# Patient Record
Sex: Male | Born: 1965
Health system: Southern US, Community
[De-identification: ages and names within clinical notes are randomized; demographics above are authoritative.]

## PROBLEM LIST (undated history)

## (undated) DIAGNOSIS — K649 Unspecified hemorrhoids: Secondary | ICD-10-CM

## (undated) DIAGNOSIS — J449 Chronic obstructive pulmonary disease, unspecified: Secondary | ICD-10-CM

## (undated) DIAGNOSIS — R001 Bradycardia, unspecified: Secondary | ICD-10-CM

## (undated) DIAGNOSIS — K219 Gastro-esophageal reflux disease without esophagitis: Secondary | ICD-10-CM

## (undated) DIAGNOSIS — R399 Unspecified symptoms and signs involving the genitourinary system: Secondary | ICD-10-CM

## (undated) DIAGNOSIS — R3129 Other microscopic hematuria: Secondary | ICD-10-CM

## (undated) DIAGNOSIS — Z8709 Personal history of other diseases of the respiratory system: Secondary | ICD-10-CM

## (undated) DIAGNOSIS — J309 Allergic rhinitis, unspecified: Secondary | ICD-10-CM

## (undated) DIAGNOSIS — K625 Hemorrhage of anus and rectum: Secondary | ICD-10-CM

## (undated) DIAGNOSIS — R6882 Decreased libido: Secondary | ICD-10-CM

## (undated) DIAGNOSIS — J339 Nasal polyp, unspecified: Secondary | ICD-10-CM

## (undated) DIAGNOSIS — Z860100 Personal history of colon polyps, unspecified: Secondary | ICD-10-CM

## (undated) DIAGNOSIS — I499 Cardiac arrhythmia, unspecified: Secondary | ICD-10-CM

## (undated) DIAGNOSIS — N401 Enlarged prostate with lower urinary tract symptoms: Secondary | ICD-10-CM

## (undated) DIAGNOSIS — E559 Vitamin D deficiency, unspecified: Secondary | ICD-10-CM

## (undated) DIAGNOSIS — T7840XA Allergy, unspecified, initial encounter: Secondary | ICD-10-CM

## (undated) DIAGNOSIS — J45909 Unspecified asthma, uncomplicated: Secondary | ICD-10-CM

## (undated) HISTORY — DX: Bradycardia, unspecified: R00.1

## (undated) HISTORY — DX: Personal history of other diseases of the respiratory system: Z87.09

## (undated) HISTORY — DX: Decreased libido: R68.82

## (undated) HISTORY — DX: Allergy, unspecified, initial encounter: T78.40XA

## (undated) HISTORY — DX: Allergic rhinitis, unspecified: J30.9

## (undated) HISTORY — PX: NO PAST SURGERIES: SHX2092

## (undated) HISTORY — DX: Unspecified asthma, uncomplicated: J45.909

---
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
## 2005-11-13 ENCOUNTER — Ambulatory Visit: Payer: Self-pay | Admitting: Family Medicine

## 2006-01-07 ENCOUNTER — Ambulatory Visit: Payer: Self-pay | Admitting: Urology

## 2006-02-21 ENCOUNTER — Ambulatory Visit: Payer: Self-pay | Admitting: General Surgery

## 2008-05-30 ENCOUNTER — Ambulatory Visit: Payer: Self-pay | Admitting: Family Medicine

## 2009-03-13 ENCOUNTER — Emergency Department: Payer: Self-pay

## 2009-03-17 ENCOUNTER — Emergency Department: Payer: Self-pay | Admitting: Emergency Medicine

## 2009-03-31 ENCOUNTER — Emergency Department: Payer: Self-pay | Admitting: Emergency Medicine

## 2009-04-07 ENCOUNTER — Ambulatory Visit: Payer: Self-pay | Admitting: General Practice

## 2013-10-14 DIAGNOSIS — Z8614 Personal history of Methicillin resistant Staphylococcus aureus infection: Secondary | ICD-10-CM

## 2013-10-14 HISTORY — DX: Personal history of Methicillin resistant Staphylococcus aureus infection: Z86.14

## 2013-10-18 ENCOUNTER — Ambulatory Visit: Payer: Self-pay | Admitting: Gastroenterology

## 2014-07-14 LAB — CBC AND DIFFERENTIAL
NEUTROS ABS: 3 /uL
WBC: 4.3 10^3/mL

## 2015-02-21 ENCOUNTER — Ambulatory Visit
Admission: RE | Admit: 2015-02-21 | Discharge: 2015-02-21 | Disposition: A | Discharge status: Home / Self Care | Payer: 59 | Source: Ambulatory Visit | Attending: Family Medicine | Admitting: Family Medicine

## 2015-02-21 ENCOUNTER — Other Ambulatory Visit: Payer: Self-pay | Admitting: Family Medicine

## 2015-02-21 DIAGNOSIS — R05 Cough: Secondary | ICD-10-CM | POA: Insufficient documentation

## 2015-02-21 DIAGNOSIS — R059 Cough, unspecified: Secondary | ICD-10-CM

## 2015-03-09 ENCOUNTER — Other Ambulatory Visit (INDEPENDENT_AMBULATORY_CARE_PROVIDER_SITE_OTHER): Payer: 59

## 2015-03-09 ENCOUNTER — Encounter: Payer: Self-pay | Admitting: Pulmonary Disease

## 2015-03-09 ENCOUNTER — Ambulatory Visit (INDEPENDENT_AMBULATORY_CARE_PROVIDER_SITE_OTHER): Payer: 59 | Admitting: Pulmonary Disease

## 2015-03-09 VITALS — BP 118/70 | HR 60 | Temp 97.3°F | Ht 73.0 in | Wt 174.0 lb

## 2015-03-09 DIAGNOSIS — J3089 Other allergic rhinitis: Secondary | ICD-10-CM

## 2015-03-09 DIAGNOSIS — J4520 Mild intermittent asthma, uncomplicated: Secondary | ICD-10-CM | POA: Diagnosis not present

## 2015-03-09 DIAGNOSIS — R0600 Dyspnea, unspecified: Secondary | ICD-10-CM

## 2015-03-09 DIAGNOSIS — J309 Allergic rhinitis, unspecified: Secondary | ICD-10-CM | POA: Insufficient documentation

## 2015-03-09 DIAGNOSIS — J45909 Unspecified asthma, uncomplicated: Secondary | ICD-10-CM | POA: Insufficient documentation

## 2015-03-09 LAB — CBC WITH DIFFERENTIAL/PLATELET
Basophils Absolute: 0 10*3/uL (ref 0.0–0.1)
Basophils Relative: 1.3 % (ref 0.0–3.0)
EOS ABS: 0.1 10*3/uL (ref 0.0–0.7)
EOS PCT: 2.2 % (ref 0.0–5.0)
HCT: 40 % (ref 39.0–52.0)
Hemoglobin: 13.5 g/dL (ref 13.0–17.0)
Lymphocytes Relative: 24.7 % (ref 12.0–46.0)
Lymphs Abs: 0.9 10*3/uL (ref 0.7–4.0)
MCHC: 33.8 g/dL (ref 30.0–36.0)
MCV: 88.5 fl (ref 78.0–100.0)
MONO ABS: 0.4 10*3/uL (ref 0.1–1.0)
MONOS PCT: 9.9 % (ref 3.0–12.0)
Neutro Abs: 2.2 10*3/uL (ref 1.4–7.7)
Neutrophils Relative %: 61.9 % (ref 43.0–77.0)
PLATELETS: 280 10*3/uL (ref 150.0–400.0)
RBC: 4.52 Mil/uL (ref 4.22–5.81)
RDW: 15.8 % — AB (ref 11.5–15.5)
WBC: 3.6 10*3/uL — ABNORMAL LOW (ref 4.0–10.5)

## 2015-03-09 NOTE — Progress Notes (Signed)
Subjective:     Patient ID: Douglas Glaze., male   DOB: [birth year], 49 y.o.   MRN: [id number]  HPI      49 y/o BM referred by DrLMorrisey in [facility] for a pulmonary evaluation> Douglas Abbott has a hx of asthma x 32yrs he says, thinks maybe he had asthma as a child but doesn't recall details and thinks he grew out of it until he started a new job w/ [employer] about 6 yrs ago (he recalls dusty/ smokey/ & mask not required);  Symptoms include cough/ sputum esp w/ upper resp infections or "colds" (recent discolored phlegm), but he denies f/c/s, no CP, and denies SOB/ chest tightness/ wheezing...  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  He denies freq bouts of bronchitis, no hx pneumonia, Tb or exposure, etc... Current Meds> Dulera200-2spBid, AlbutHFA rescue inhaler, Singulair10, Flonase, & Prilosec20Bid...  EXAM reveals Afeb, VSS, O2sat=98% on RA;  HEENT- sl red, no exud, mallampati2;  Chest- clear x scat rhonchi, no w/r/consolidation;  Cor- RR w/o m/r/g;  Abd- soft, neg;  Ext- neg w/o c/c/e...   CXR 02/21/15 in Epic & done at [facility] showed norm heart size, clear lungs, mild degen changes in spine, NAD...  Spirometry 03/09/15 here showed FVC=4.09 (88%), FEV1=2.56 (69%), %1sec=63, mid-flows are reduced at 33% predicted; this is c/w mod airflow obstruction & poss GOLD Stage2 COPD.  LABS> CBC- ok w/ Hg=13.5, WBC=3.6 w/ 2%eos (absolute=100);  IgE=1171 w/ pos RAST tests tp Grasses> Trees> Fungus> Ragweed> Dust> animal dander  IMP/PLAN>>  Douglas Abbott has a hx asthma w/ an extrinsic component & I am concerned for an element of fixed obstruction based on airway remodeling over the yrs; he has only a min smoking hx in the past to  account for any COPD; he is on a good regimen w/ DULERA, SINGULAIR, and a rescue inhaler;  The remarkable IgE level and RAST tests point to another option for treatment w/ Arvid Right & we will pursue the approval process for this med w/ his insurance company...     Past Medical History  Diagnosis Date  . Asthma   . Allergic rhinitis     No past surgical history on file.   Outpatient Encounter Prescriptions as of 03/09/2015  Medication Sig  . albuterol (PROVENTIL HFA;VENTOLIN HFA) 108 (90 BASE) MCG/ACT inhaler Inhale 2 puffs into the lungs every 6 (six) hours as needed for wheezing or shortness of breath.  . DULERA 200-5 MCG/ACT AERO 2 puffs 2 (two) times daily.  . fluticasone (FLONASE) 50 MCG/ACT nasal spray Place into both nostrils daily as needed for allergies or rhinitis.  Marland Kitchen montelukast (SINGULAIR) 10 MG tablet Take 10 mg by mouth at bedtime.  Marland Kitchen omeprazole (PRILOSEC) 20 MG capsule Take 20 mg by mouth 2 (two) times daily before a meal.  . [DISCONTINUED] VENTOLIN HFA 108 (90 BASE) MCG/ACT inhaler 2 puffs every 4 (four) hours as needed.   No facility-administered encounter medications on file as of 03/09/2015.    No Known Allergies   Immunization History  Administered Date(s) Administered  . Influenza-Unspecified 08/08/2014    Family History  Problem Relation Age of Onset  . Asthma Mother   .  Lung cancer Mother     History   Social History  . Marital Status: Married    Spouse Name: N/A  . Number of Children: N/A  . Years of Education: N/A   Occupational History  . Not on file.   Social History Main Topics  . Smoking status: Former Smoker -- 1.00 packs/day for 8 years    Types: Cigarettes  . Smokeless tobacco: Not on file  . Alcohol Use: No  . Drug Use: Not on file  . Sexual Activity: Not on file   Other Topics Concern  . Not on file   Social History Narrative  . No narrative on file    Current Medications, Allergies, Past Medical History, Past Surgical  History, Family History, and Social History were reviewed in [employer] record.   Review of Systems         All symptoms NEG except where BOLDED >>  Constitutional:  F/C/S, fatigue, anorexia, unexpected weight change. HEENT:  HA, visual changes, hearing loss, earache, nasal symptoms, sore throat, mouth sores, hoarseness. Resp:  cough, sputum, hemoptysis; SOB, tightness, wheezing. Cardio:  CP, palpit, DOE, orthopnea, edema. GI:  N/V/D/C, blood in stool; reflux, abd pain, distention, gas. GU:  dysuria, freq, urgency, hematuria, flank pain, voiding difficulty. MS:  joint pain, swelling, tenderness, decr ROM; neck pain, back pain, etc. Neuro:  HA, tremors, seizures, dizziness, syncope, weakness, numbness, gait abn. Skin:  suspicious lesions or skin rash. Heme:  adenopathy, bruising, bleeding. Psyche:  confusion, agitation, sleep disturbance, hallucinations, anxiety, depression suicidal.   Objective:   Physical Exam      Vital Signs:  Reviewed...  General:  WD, WN, 49 y/o BM in NAD; alert & oriented; pleasant & cooperative... HEENT:  [place]/AT; Conjunctiva- pink, Sclera- nonicteric, EOM-wnl, PERRLA, EACs-clear, TMs-wnl; NOSE-clear; THROAT-clear & wnl. Neck:  Supple w/ full ROM; no JVD; normal carotid impulses w/o bruits; no thyromegaly or nodules palpated; no lymphadenopathy. Chest:  Clear to P & A; without wheezes, rales, or rhonchi heard at this time Heart:  Regular Rhythm; norm S1 & S2 without murmurs, rubs, or gallops detected. Abdomen:  Soft & nontender- no guarding or rebound; normal bowel sounds; no organomegaly or masses palpated. Ext:  Normal ROM; without deformities or arthritic changes; no varicose veins, venous insuffic, or edema;  Pulses intact w/o bruits. Neuro:  CNs II-XII intact; motor testing normal; sensory testing normal; gait normal & balance OK. Derm:  No lesions noted; no rash etc. Lymph:  No cervical, supraclavicular, axillary, or inguinal  adenopathy palpated.   Assessment:     IMP >>  ASTHMA w/ an element of fixed obstructive disease Allergic Rhinitis Elevated IgE level and remarkable RAST panel  PLAN >>  Douglas Abbott has a hx asthma w/ an extrinsic component & I am concerned for an element of fixed obstruction based on airway remodeling over the yrs; he has only a min smoking hx in the past to account for any COPD; he is on a good regimen w/ DULERA, SINGULAIR, and a rescue inhaler;  The remarkable IgE level and RAST tests point to another option for treatment w/ Arvid Right & we will pursue the approval process for this med w/ his insurance company      Plan:     Patient's Medications  New Prescriptions   No medications on file  Previous Medications   ALBUTEROL (PROVENTIL HFA;VENTOLIN HFA) 108 (90 BASE) MCG/ACT INHALER    Inhale 2 puffs into the lungs every 6 (six) hours as  needed for wheezing or shortness of breath.   DULERA 200-5 MCG/ACT AERO    2 puffs 2 (two) times daily.   FLUTICASONE (FLONASE) 50 MCG/ACT NASAL SPRAY    Place into both nostrils daily as needed for allergies or rhinitis.   MONTELUKAST (SINGULAIR) 10 MG TABLET    Take 10 mg by mouth at bedtime.   OMEPRAZOLE (PRILOSEC) 20 MG CAPSULE    Take 20 mg by mouth 2 (two) times daily before a meal.  Modified Medications   No medications on file  Discontinued Medications   VENTOLIN HFA 108 (90 BASE) MCG/ACT INHALER    2 puffs every 4 (four) hours as needed.

## 2015-03-09 NOTE — Patient Instructions (Signed)
Douglas Abbott- it was nice meeting you today...  We reviewed your recent CXR (the radiologist agrees w/ me that your XRay is clear), & we did a baseline pulmonary function test...  We decided to check some lab work to see if there are any other options for treating your asthma...    We will contact you w/ the results when available...   For now I would recommend continuing the DULERA200- 2sprays twice daily,  and use the ALBUTEROL-HFA rescue inhaler just as needed for wheezing/ chest tightness, etc...   It is OK to use an OTC antihistamine like Claritin or Zyrtek as needed for allergy symptoms, along w/ the Singulair & Flonase as you are doing...  Call for any questions, ant breathing problems, or if we can be of service in any way.Marland KitchenMarland Kitchen

## 2015-03-10 LAB — ALLERGY FULL PROFILE
ALTERNARIA ALTERNATA: 0.79 kU/L — AB
ASPERGILLUS FUMIGATUS M3: 3.54 kU/L — AB
Allergen, D pternoyssinus,d7: 4.2 kU/L — ABNORMAL HIGH
BAHIA GRASS: 19.2 kU/L — AB
BOX ELDER: 3.86 kU/L — AB
Bermuda Grass: 16.1 kU/L — ABNORMAL HIGH
CANDIDA ALBICANS: 3.56 kU/L — AB
COMMON RAGWEED: 6.44 kU/L — AB
CURVULARIA LUNATA: 0.49 kU/L — AB
Cat Dander: 0.23 kU/L — ABNORMAL HIGH
D. FARINAE: 13.6 kU/L — AB
DOG DANDER: 0.44 kU/L — AB
ELM IGE: 1.61 kU/L — AB
FESCUE: 37.2 kU/L — AB
G005 Rye, Perennial: 28.8 kU/L — ABNORMAL HIGH
G009 Red Top: 29.1 kU/L — ABNORMAL HIGH
GOOSE FEATHERS: 0.21 kU/L — AB
Goldenrod: 1.99 kU/L — ABNORMAL HIGH
HELMINTHOSPORIUM HALODES: 1.93 kU/L — AB
House Dust Hollister: 0.87 kU/L — ABNORMAL HIGH
IGE (IMMUNOGLOBULIN E), SERUM: 1171 kU/L — AB (ref <115)
LAMB'S QUARTERS CLASS: 1.64 kU/L — AB
OAK CLASS: 20.2 kU/L — AB
Plantain: 2.82 kU/L — ABNORMAL HIGH
SYCAMORE TREE: 2.27 kU/L — AB
Stemphylium Botryosum: 0.89 kU/L — ABNORMAL HIGH
Timothy Grass: 20.4 kU/L — ABNORMAL HIGH

## 2015-03-14 ENCOUNTER — Telehealth: Payer: Self-pay | Admitting: Pulmonary Disease

## 2015-03-14 NOTE — Telephone Encounter (Signed)
lmtcb X1 for pt  

## 2015-03-14 NOTE — Telephone Encounter (Signed)
Returning Dr Jeannine Kitten call to discuss lab results.  Per Apolonio Schneiders, Dr Lenna Gilford is discussing starting the patient on Xolair.  Will send to Apolonio Schneiders to document anything else needed.  Otherwise, message can be closed.

## 2015-04-05 ENCOUNTER — Other Ambulatory Visit: Payer: Self-pay | Admitting: Family Medicine

## 2015-04-05 ENCOUNTER — Encounter (INDEPENDENT_AMBULATORY_CARE_PROVIDER_SITE_OTHER): Payer: Self-pay

## 2015-04-05 ENCOUNTER — Encounter: Payer: Self-pay | Admitting: Family Medicine

## 2015-04-05 ENCOUNTER — Ambulatory Visit (INDEPENDENT_AMBULATORY_CARE_PROVIDER_SITE_OTHER): Payer: 59 | Admitting: Family Medicine

## 2015-04-05 DIAGNOSIS — Z0000 Encounter for general adult medical examination without abnormal findings: Secondary | ICD-10-CM | POA: Insufficient documentation

## 2015-04-05 DIAGNOSIS — N528 Other male erectile dysfunction: Secondary | ICD-10-CM | POA: Diagnosis not present

## 2015-04-05 MED ORDER — SILDENAFIL CITRATE 100 MG PO TABS: 50.0000 mg | ORAL_TABLET | Freq: Every day | ORAL | Status: DC | PRN

## 2015-04-05 NOTE — Patient Instructions (Signed)
F/U 6 MO 

## 2015-04-05 NOTE — Progress Notes (Signed)
Name: Douglas Abbott.   MRN: [id number]    DOB: Jun 01, 1966   Date:04/05/2015       Progress Note  Subjective  Chief Complaint  Chief Complaint  Patient presents with  . Annual Exam    HPI  49 year old who presents for annual H&P. He has chronic asthma for which she has recently seen pulmonologist. No other new problems  Past Medical History  Diagnosis Date  . Asthma   . Allergic rhinitis   . History of pleurisy   . Decreased libido   . Allergy     History  Substance Use Topics  . Smoking status: Former Smoker -- 1.00 packs/day for 8 years    Types: Cigarettes  . Smokeless tobacco: Not on file  . Alcohol Use: 0.0 oz/week    0 Standard drinks or equivalent per week     Comment: occassinal     Current outpatient prescriptions:  .  albuterol (PROVENTIL HFA;VENTOLIN HFA) 108 (90 BASE) MCG/ACT inhaler, Inhale 2 puffs into the lungs every 6 (six) hours as needed for wheezing or shortness of breath., Disp: , Rfl:  .  fluticasone (FLONASE) 50 MCG/ACT nasal spray, Place into both nostrils daily as needed for allergies or rhinitis., Disp: , Rfl:  .  montelukast (SINGULAIR) 10 MG tablet, Take 10 mg by mouth at bedtime., Disp: , Rfl:  .  omeprazole (PRILOSEC) 20 MG capsule, Take 20 mg by mouth 2 (two) times daily before a meal., Disp: , Rfl:  .  DULERA 200-5 MCG/ACT AERO, 2 puffs 2 (two) times daily., Disp: , Rfl: 0  No Known Allergies  Review of Systems  Constitutional: Negative for fever, chills and weight loss.  HENT: Negative for congestion, hearing loss, sore throat and tinnitus.   Eyes: Negative for blurred vision, double vision and redness.  Respiratory: Negative for cough, hemoptysis and shortness of breath.   Cardiovascular: Negative for chest pain, palpitations, orthopnea, claudication and leg swelling.  Gastrointestinal: Negative for heartburn, nausea, vomiting, diarrhea, constipation and blood in stool.  Genitourinary: Negative for dysuria, urgency, frequency and  hematuria.       Erectile dysfunction  Musculoskeletal: Negative for myalgias, back pain, joint pain, falls and neck pain.  Skin: Negative for itching.  Neurological: Negative for dizziness, tingling, tremors, focal weakness, seizures, loss of consciousness, weakness and headaches.  Endo/Heme/Allergies: Does not bruise/bleed easily.  Psychiatric/Behavioral: Negative for depression and substance abuse. The patient is not nervous/anxious and does not have insomnia.      Objective  Filed Vitals:   04/05/15 1118  BP: 124/86  Pulse: 74  Temp: 98.7 F (37.1 C)  TempSrc: Oral  Resp: 18  Height: 6\' 1"  (1.854 m)  Weight: 173 lb 6.4 oz (78.654 kg)  SpO2: 98%     Physical Exam  Constitutional: He is oriented to person, place, and time and well-developed, well-nourished, and in no distress.  HENT:  Head: Normocephalic.  Eyes: EOM are normal. Pupils are equal, round, and reactive to light.  Neck: Normal range of motion. Neck supple. No thyromegaly present.  Cardiovascular: Normal rate, regular rhythm and normal heart sounds.   No murmur heard. Pulmonary/Chest: Effort normal and breath sounds normal. No respiratory distress. He has no wheezes.  Abdominal: Soft. Bowel sounds are normal.  Genitourinary: Rectum normal, prostate normal and penis normal.  Musculoskeletal: Normal range of motion. He exhibits no edema.  Lymphadenopathy:    He has no cervical adenopathy.  Neurological: He is alert and oriented to person, place, and  time. No cranial nerve deficit. Gait normal. Coordination normal.  Skin: Skin is warm and dry. No rash noted.  Psychiatric: Affect and judgment normal.      Assessment & Plan  1. Routine general medical examination at a health care facility - TSH - CBC - Lipid panel - Comprehensive metabolic panel - PSA   2. Other male erectile dysfunction  - sildenafil (VIAGRA) 100 MG tablet; Take 0.5-1 tablets (50-100 mg total) by mouth daily as needed for erectile  dysfunction.  Dispense: 5 tablet; Refill: 11

## 2015-04-06 LAB — COMPREHENSIVE METABOLIC PANEL
A/G RATIO: 2.2 (ref 1.1–2.5)
ALT: 18 IU/L (ref 0–44)
AST: 21 IU/L (ref 0–40)
Albumin: 4.4 g/dL (ref 3.5–5.5)
Alkaline Phosphatase: 113 IU/L (ref 39–117)
BUN/Creatinine Ratio: 13 (ref 9–20)
BUN: 12 mg/dL (ref 6–24)
Bilirubin Total: 0.5 mg/dL (ref 0.0–1.2)
CALCIUM: 9.5 mg/dL (ref 8.7–10.2)
CO2: 25 mmol/L (ref 18–29)
Chloride: 101 mmol/L (ref 97–108)
Creatinine, Ser: 0.94 mg/dL (ref 0.76–1.27)
GFR calc Af Amer: 110 mL/min/{1.73_m2} (ref >59)
GFR, EST NON AFRICAN AMERICAN: 95 mL/min/{1.73_m2} (ref >59)
GLUCOSE: 82 mg/dL (ref 65–99)
Globulin, Total: 2 g/dL (ref 1.5–4.5)
POTASSIUM: 4.4 mmol/L (ref 3.5–5.2)
SODIUM: 141 mmol/L (ref 134–144)
TOTAL PROTEIN: 6.4 g/dL (ref 6.0–8.5)

## 2015-04-06 LAB — TSH: TSH: 0.738 u[IU]/mL (ref 0.450–4.500)

## 2015-04-06 LAB — CBC
HEMOGLOBIN: 14.5 g/dL (ref 12.6–17.7)
Hematocrit: 43.2 % (ref 37.5–51.0)
MCH: 29.4 pg (ref 26.6–33.0)
MCHC: 33.6 g/dL (ref 31.5–35.7)
MCV: 87 fL (ref 79–97)
Platelets: 286 10*3/uL (ref 150–379)
RBC: 4.94 x10E6/uL (ref 4.14–5.80)
RDW: 15.8 % — ABNORMAL HIGH (ref 12.3–15.4)
WBC: 4.2 10*3/uL (ref 3.4–10.8)

## 2015-04-06 LAB — LIPID PANEL
CHOL/HDL RATIO: 2.7 ratio (ref 0.0–5.0)
Cholesterol, Total: 200 mg/dL — ABNORMAL HIGH (ref 100–199)
HDL: 74 mg/dL (ref >39)
LDL CALC: 117 mg/dL — AB (ref 0–99)
Triglycerides: 43 mg/dL (ref 0–149)
VLDL CHOLESTEROL CAL: 9 mg/dL (ref 5–40)

## 2015-04-06 LAB — PSA: Prostate Specific Ag, Serum: 1.3 ng/mL (ref 0.0–4.0)

## 2015-04-06 NOTE — Progress Notes (Signed)
Patient notified of lab results

## 2015-05-29 ENCOUNTER — Telehealth: Payer: Self-pay | Admitting: Family Medicine

## 2015-05-29 NOTE — Telephone Encounter (Signed)
Disregard earlier message. Pt needs refill on Dulera to be sent to  [facility].

## 2015-05-29 NOTE — Telephone Encounter (Signed)
Patient is not doing to well at work. He is needing his prescription for provintal. He has been out for almost a week. Please send to [facility].

## 2015-05-30 ENCOUNTER — Telehealth: Payer: Self-pay | Admitting: Emergency Medicine

## 2015-05-30 MED ORDER — DULERA 200-5 MCG/ACT IN AERO: 2.0000 | INHALATION_SPRAY | Freq: Two times a day (BID) | RESPIRATORY_TRACT | Status: DC

## 2015-05-30 MED ORDER — ALBUTEROL SULFATE HFA 108 (90 BASE) MCG/ACT IN AERS: 2.0000 | INHALATION_SPRAY | Freq: Four times a day (QID) | RESPIRATORY_TRACT | Status: DC | PRN

## 2015-05-31 NOTE — Telephone Encounter (Signed)
Medication sen to pharmacy

## 2015-05-31 NOTE — Telephone Encounter (Signed)
Script sent to pharmacy.

## 2015-06-06 NOTE — Telephone Encounter (Signed)
Prescription has been sent to the pharmacy. 

## 2015-08-28 ENCOUNTER — Ambulatory Visit: Payer: 59 | Admitting: Family Medicine

## 2015-09-11 ENCOUNTER — Ambulatory Visit (INDEPENDENT_AMBULATORY_CARE_PROVIDER_SITE_OTHER): Payer: 59 | Admitting: Family Medicine

## 2015-09-11 ENCOUNTER — Encounter: Payer: Self-pay | Admitting: Family Medicine

## 2015-09-11 VITALS — BP 110/60 | HR 64 | Temp 98.1°F | Resp 18 | Ht 73.0 in | Wt 173.5 lb

## 2015-09-11 DIAGNOSIS — R413 Other amnesia: Secondary | ICD-10-CM | POA: Diagnosis not present

## 2015-09-11 DIAGNOSIS — R319 Hematuria, unspecified: Secondary | ICD-10-CM | POA: Insufficient documentation

## 2015-09-11 DIAGNOSIS — E785 Hyperlipidemia, unspecified: Secondary | ICD-10-CM | POA: Diagnosis not present

## 2015-09-11 DIAGNOSIS — Z23 Encounter for immunization: Secondary | ICD-10-CM

## 2015-09-11 DIAGNOSIS — J302 Other seasonal allergic rhinitis: Secondary | ICD-10-CM | POA: Diagnosis not present

## 2015-09-11 NOTE — Progress Notes (Signed)
Name: Douglas Abbott.   MRN: [id number]    DOB: October 21, 1965   Date:09/11/2015       Progress Note  Subjective  Chief Complaint  Chief Complaint  Patient presents with  . Asthma    HPI  Asthma history of present illness  Patient has a history of asthma over 5 years. It is exacerbated primarily by cough and wheezing. Of recent he's been on Dulera 200 mg-52 puffs twice a day. Uses albuterol on a very rare basis. When questioned he states that he does have nocturnal cough very often but does not use albuterol and has not attributed this particular to his asthma.  Allergic rhinitis  History of allergic rhinitis for over 5 years. Symptoms are worse during the seasonal changes or he has a respiratory infection. She currently on fluticasone nasally and Singulair.  Memory change  Patient complains of some mild change in his memory. His wife notes that he occasionally loses things. He states he always murmurs him when he gets back to them.  Hyperlipidemia  Patient has a history of hyperlipidemia for at least 1 years.  Current medical regimen consist of none except diet .  Compliance is fair .  Diet and exercise are currently followed fairly well .  Risk factors for cardiovascular disease include hyperlipidemia . Marland Kitchen   There have been no side effects from the medication.    Past Medical History  Diagnosis Date  . Asthma   . Allergic rhinitis   . History of pleurisy   . Decreased libido   . Allergy     Social History  Substance Use Topics  . Smoking status: Former Smoker -- 1.00 packs/day for 8 years    Types: Cigarettes  . Smokeless tobacco: Not on file  . Alcohol Use: 0.0 oz/week    0 Standard drinks or equivalent per week     Comment: occassinal     Current outpatient prescriptions:  .  albuterol (PROVENTIL HFA;VENTOLIN HFA) 108 (90 BASE) MCG/ACT inhaler, Inhale 2 puffs into the lungs every 6 (six) hours as needed for wheezing or shortness of breath., Disp: 1 Inhaler, Rfl:  5 .  DULERA 200-5 MCG/ACT AERO, Inhale 2 puffs into the lungs 2 (two) times daily., Disp: 1 Inhaler, Rfl: 5 .  fluticasone (FLONASE) 50 MCG/ACT nasal spray, Place into both nostrils daily as needed for allergies or rhinitis., Disp: , Rfl:  .  montelukast (SINGULAIR) 10 MG tablet, Take 10 mg by mouth at bedtime., Disp: , Rfl:  .  omeprazole (PRILOSEC) 20 MG capsule, take 1 capsule by mouth twice a day, Disp: 60 capsule, Rfl: 2 .  sildenafil (VIAGRA) 100 MG tablet, Take 0.5-1 tablets (50-100 mg total) by mouth daily as needed for erectile dysfunction., Disp: 5 tablet, Rfl: 11  No Known Allergies  Review of Systems  Constitutional: Negative for fever, chills and weight loss.  HENT: Positive for congestion. Negative for hearing loss, sore throat and tinnitus.   Eyes: Negative for blurred vision, double vision and redness.  Respiratory: Positive for cough and wheezing. Negative for hemoptysis and shortness of breath.   Cardiovascular: Negative for chest pain, palpitations, orthopnea, claudication and leg swelling.  Gastrointestinal: Negative for heartburn, nausea, vomiting, diarrhea, constipation and blood in stool.  Genitourinary: Negative for dysuria, urgency, frequency and hematuria.  Musculoskeletal: Negative for myalgias, back pain, joint pain, falls and neck pain.  Skin: Negative for itching.  Neurological: Negative for dizziness, tingling, tremors, focal weakness, seizures, loss of consciousness, weakness and  headaches.  Endo/Heme/Allergies: Does not bruise/bleed easily.  Psychiatric/Behavioral: Negative for depression and substance abuse. The patient is not nervous/anxious and does not have insomnia.      Objective  Filed Vitals:   09/11/15 0907  BP: 110/60  Pulse: 64  Temp: 98.1 F (36.7 C)  TempSrc: Oral  Resp: 18  Height: 6\' 1"  (1.854 m)  Weight: 173 lb 8 oz (78.699 kg)  SpO2: 97%     Physical Exam  Constitutional: He is oriented to person, place, and time and  well-developed, well-nourished, and in no distress.  HENT:  Head: Normocephalic.  Polys illness turbinate swelling and erythema with clear discharge  Eyes: EOM are normal. Pupils are equal, round, and reactive to light.  Neck: Normal range of motion. Neck supple. No thyromegaly present.  Cardiovascular: Normal rate, regular rhythm and normal heart sounds.   No murmur heard. Pulmonary/Chest: Effort normal. No respiratory distress. He has no wheezes.  Slightly diminished breath sounds without rales rhonchi or wheezes.  Abdominal: Soft. Bowel sounds are normal.  Musculoskeletal: Normal range of motion. He exhibits no edema.  Lymphadenopathy:    He has no cervical adenopathy.  Neurological: He is alert and oriented to person, place, and time. No cranial nerve deficit. Gait normal. Coordination normal.  Skin: Skin is warm and dry. No rash noted.  Psychiatric: Affect and judgment normal.      Assessment & Plan   1. 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 Need for influenza vaccination Given  - Flu Vaccine QUAD 36+ mos PF IM (Fluarix & Fluzone Quad PF)  3. Other seasonal allergic rhinitis Continue Flonase  4. Hyperlipemia Labs - Lipid panel

## 2015-09-12 LAB — COMPREHENSIVE METABOLIC PANEL
ALBUMIN: 4.3 g/dL (ref 3.5–5.5)
ALT: 21 IU/L (ref 0–44)
AST: 21 IU/L (ref 0–40)
Albumin/Globulin Ratio: 2 (ref 1.1–2.5)
Alkaline Phosphatase: 118 IU/L — ABNORMAL HIGH (ref 39–117)
BUN / CREAT RATIO: 15 (ref 9–20)
BUN: 14 mg/dL (ref 6–24)
Bilirubin Total: 0.5 mg/dL (ref 0.0–1.2)
CALCIUM: 9.1 mg/dL (ref 8.7–10.2)
CO2: 25 mmol/L (ref 18–29)
Chloride: 104 mmol/L (ref 97–106)
Creatinine, Ser: 0.95 mg/dL (ref 0.76–1.27)
GFR, EST AFRICAN AMERICAN: 109 mL/min/{1.73_m2} (ref >59)
GFR, EST NON AFRICAN AMERICAN: 94 mL/min/{1.73_m2} (ref >59)
GLUCOSE: 88 mg/dL (ref 65–99)
Globulin, Total: 2.1 g/dL (ref 1.5–4.5)
Potassium: 4.8 mmol/L (ref 3.5–5.2)
Sodium: 141 mmol/L (ref 136–144)
TOTAL PROTEIN: 6.4 g/dL (ref 6.0–8.5)

## 2015-09-12 LAB — CBC
HEMOGLOBIN: 13.3 g/dL (ref 12.6–17.7)
Hematocrit: 40.7 % (ref 37.5–51.0)
MCH: 29.5 pg (ref 26.6–33.0)
MCHC: 32.7 g/dL (ref 31.5–35.7)
MCV: 90 fL (ref 79–97)
NRBC: 0 % (ref 0–0)
PLATELETS: 298 10*3/uL (ref 150–379)
RBC: 4.51 x10E6/uL (ref 4.14–5.80)
RDW: 15.9 % — ABNORMAL HIGH (ref 12.3–15.4)
WBC: 4 10*3/uL (ref 3.4–10.8)

## 2015-09-12 LAB — LIPID PANEL
CHOL/HDL RATIO: 2.9 ratio (ref 0.0–5.0)
Cholesterol, Total: 192 mg/dL (ref 100–199)
HDL: 67 mg/dL (ref >39)
LDL CALC: 114 mg/dL — AB (ref 0–99)
Triglycerides: 56 mg/dL (ref 0–149)
VLDL CHOLESTEROL CAL: 11 mg/dL (ref 5–40)

## 2015-09-12 LAB — TSH: TSH: 0.881 u[IU]/mL (ref 0.450–4.500)

## 2015-09-13 ENCOUNTER — Telehealth: Payer: Self-pay | Admitting: Emergency Medicine

## 2015-09-13 LAB — METHYLMALONIC ACID, SERUM: METHYLMALONIC ACID: 98 nmol/L (ref 0–378)

## 2015-09-13 NOTE — Telephone Encounter (Signed)
Patient notified

## 2015-09-19 ENCOUNTER — Telehealth: Payer: Self-pay | Admitting: Emergency Medicine

## 2015-09-19 NOTE — Telephone Encounter (Signed)
Patient notified of lab results

## 2015-11-28 ENCOUNTER — Other Ambulatory Visit: Payer: Self-pay | Admitting: Family Medicine

## 2015-11-28 NOTE — Telephone Encounter (Signed)
Dulera price had doubled since las pick up. Will check with human resource for what is now on formulary and call back with name for refills

## 2015-11-29 ENCOUNTER — Other Ambulatory Visit: Payer: Self-pay | Admitting: Family Medicine

## 2015-11-29 MED ORDER — FLUTICASONE FUROATE-VILANTEROL 100-25 MCG/INH IN AEPB: 1.0000 | INHALATION_SPRAY | Freq: Every day | RESPIRATORY_TRACT | Status: DC

## 2015-11-29 NOTE — Telephone Encounter (Signed)
Patient called and stated that Insurance will no longer cover Dulera. Will cover Asmanex, Breo elliptica, Advair and Qvar.. Per Dr. Ancil Boozer call in [place] 1 time a day. Script sent to [facility]

## 2016-01-06 ENCOUNTER — Emergency Department: Payer: 59

## 2016-01-06 ENCOUNTER — Emergency Department
Admission: EM | Admit: 2016-01-06 | Discharge: 2016-01-06 | Disposition: A | Discharge status: Home / Self Care | Payer: 59 | Attending: Emergency Medicine | Admitting: Emergency Medicine

## 2016-01-06 ENCOUNTER — Encounter: Payer: Self-pay | Admitting: Emergency Medicine

## 2016-01-06 DIAGNOSIS — J309 Allergic rhinitis, unspecified: Secondary | ICD-10-CM | POA: Diagnosis not present

## 2016-01-06 DIAGNOSIS — J069 Acute upper respiratory infection, unspecified: Secondary | ICD-10-CM

## 2016-01-06 DIAGNOSIS — Z87891 Personal history of nicotine dependence: Secondary | ICD-10-CM | POA: Insufficient documentation

## 2016-01-06 DIAGNOSIS — J988 Other specified respiratory disorders: Secondary | ICD-10-CM | POA: Diagnosis not present

## 2016-01-06 DIAGNOSIS — F1210 Cannabis abuse, uncomplicated: Secondary | ICD-10-CM | POA: Diagnosis not present

## 2016-01-06 DIAGNOSIS — R05 Cough: Secondary | ICD-10-CM | POA: Diagnosis present

## 2016-01-06 MED ORDER — FLUTICASONE PROPIONATE 50 MCG/ACT NA SUSP: 2.0000 | Freq: Every day | NASAL | Status: DC

## 2016-01-06 MED ORDER — ALBUTEROL SULFATE HFA 108 (90 BASE) MCG/ACT IN AERS: 1.0000 | INHALATION_SPRAY | Freq: Four times a day (QID) | RESPIRATORY_TRACT | Status: DC | PRN

## 2016-01-06 MED ORDER — PSEUDOEPH-BROMPHEN-DM 30-2-10 MG/5ML PO SYRP: 10.0000 mL | ORAL_SOLUTION | Freq: Four times a day (QID) | ORAL | Status: DC | PRN

## 2016-01-06 NOTE — Discharge Instructions (Signed)
Upper Respiratory Infection, Adult °Most upper respiratory infections (URIs) are a viral infection of the air passages leading to the lungs. A URI affects the nose, throat, and upper air passages. The most common type of URI is nasopharyngitis and is typically referred to as "the common cold." °URIs run their course and usually go away on their own. Most of the time, a URI does not require medical attention, but sometimes a bacterial infection in the upper airways can follow a viral infection. This is called a secondary infection. Sinus and middle ear infections are common types of secondary upper respiratory infections. °Bacterial pneumonia can also complicate a URI. A URI can worsen asthma and chronic obstructive pulmonary disease (COPD). Sometimes, these complications can require emergency medical care and may be life threatening.  °CAUSES °Almost all URIs are caused by viruses. A virus is a type of germ and can spread from one person to another.  °RISKS FACTORS °You may be at risk for a URI if:  °· You smoke.   °· You have chronic heart or lung disease. °· You have a weakened defense (immune) system.   °· You are very young or very old.   °· You have nasal allergies or asthma. °· You work in crowded or poorly ventilated areas. °· You work in health care facilities or schools. °SIGNS AND SYMPTOMS  °Symptoms typically develop 2-3 days after you come in contact with a cold virus. Most viral URIs last 7-10 days. However, viral URIs from the influenza virus (flu virus) can last 14-18 days and are typically more severe. Symptoms may include:  °· Runny or stuffy (congested) nose.   °· Sneezing.   °· Cough.   °· Sore throat.   °· Headache.   °· Fatigue.   °· Fever.   °· Loss of appetite.   °· Pain in your forehead, behind your eyes, and over your cheekbones (sinus pain). °· Muscle aches.   °DIAGNOSIS  °Your health care provider may diagnose a URI by: °· Physical exam. °· Tests to check that your symptoms are not due to  another condition such as: °· Strep throat. °· Sinusitis. °· Pneumonia. °· Asthma. °TREATMENT  °A URI goes away on its own with time. It cannot be cured with medicines, but medicines may be prescribed or recommended to relieve symptoms. Medicines may help: °· Reduce your fever. °· Reduce your cough. °· Relieve nasal congestion. °HOME CARE INSTRUCTIONS  °· Take medicines only as directed by your health care provider.   °· Gargle warm saltwater or take cough drops to comfort your throat as directed by your health care provider. °· Use a warm mist humidifier or inhale steam from a shower to increase air moisture. This may make it easier to breathe. °· Drink enough fluid to keep your urine clear or pale yellow.   °· Eat soups and other clear broths and maintain good nutrition.   °· Rest as needed.   °· Return to work when your temperature has returned to normal or as your health care provider advises. You may need to stay home longer to avoid infecting others. You can also use a face mask and careful hand washing to prevent spread of the virus. °· Increase the usage of your inhaler if you have asthma.   °· Do not use any tobacco products, including cigarettes, chewing tobacco, or electronic cigarettes. If you need help quitting, ask your health care provider. °PREVENTION  °The best way to protect yourself from getting a cold is to practice good hygiene.  °· Avoid oral or hand contact with people with cold   symptoms.   °· Wash your hands often if contact occurs.   °There is no clear evidence that vitamin C, vitamin E, echinacea, or exercise reduces the chance of developing a cold. However, it is always recommended to get plenty of rest, exercise, and practice good nutrition.  °SEEK MEDICAL CARE IF:  °· You are getting worse rather than better.   °· Your symptoms are not controlled by medicine.   °· You have chills. °· You have worsening shortness of breath. °· You have brown or red mucus. °· You have yellow or brown nasal  discharge. °· You have pain in your face, especially when you bend forward. °· You have a fever. °· You have swollen neck glands. °· You have pain while swallowing. °· You have white areas in the back of your throat. °SEEK IMMEDIATE MEDICAL CARE IF:  °· You have severe or persistent: °¨ Headache. °¨ Ear pain. °¨ Sinus pain. °¨ Chest pain. °· You have chronic lung disease and any of the following: °¨ Wheezing. °¨ Prolonged cough. °¨ Coughing up blood. °¨ A change in your usual mucus. °· You have a stiff neck. °· You have changes in your: °¨ Vision. °¨ Hearing. °¨ Thinking. °¨ Mood. °MAKE SURE YOU:  °· Understand these instructions. °· Will watch your condition. °· Will get help right away if you are not doing well or get worse. °  °This information is not intended to replace advice given to you by your health care provider. Make sure you discuss any questions you have with your health care provider. °  °Document Released: 03/26/2001 Document Revised: 02/14/2015 Document Reviewed: 01/05/2014 °Elsevier Interactive Patient Education ©2016 [employer] °Allergic Rhinitis °Allergic rhinitis is when the mucous membranes in the nose respond to allergens. Allergens are particles in the air that cause your body to have an allergic reaction. This causes you to release allergic antibodies. Through a chain of events, these eventually cause you to release histamine into the blood stream. Although meant to protect the body, it is this release of histamine that causes your discomfort, such as frequent sneezing, congestion, and an itchy, runny nose.  °CAUSES °Seasonal allergic rhinitis (hay fever) is caused by pollen allergens that may come from grasses, trees, and weeds. Year-round allergic rhinitis (perennial allergic rhinitis) is caused by allergens such as house dust mites, pet dander, and mold spores. °SYMPTOMS °· Nasal stuffiness (congestion). °· Itchy, runny nose with sneezing and tearing of the eyes. °DIAGNOSIS °Your health  care provider can help you determine the allergen or allergens that trigger your symptoms. If you and your health care provider are unable to determine the allergen, skin or blood testing may be used. Your health care provider will diagnose your condition after taking your health history and performing a physical exam. Your health care provider may assess you for other related conditions, such as asthma, pink eye, or an ear infection. °TREATMENT °Allergic rhinitis does not have a cure, but it can be controlled by: °· Medicines that block allergy symptoms. These may include allergy shots, nasal sprays, and oral antihistamines. °· Avoiding the allergen. °Hay fever may often be treated with antihistamines in pill or nasal spray forms. Antihistamines block the effects of histamine. There are over-the-counter medicines that may help with nasal congestion and swelling around the eyes. Check with your health care provider before taking or giving this medicine. °If avoiding the allergen or the medicine prescribed do not work, there are many new medicines your health care provider can prescribe. Stronger medicine   may be used if initial measures are ineffective. Desensitizing injections can be used if medicine and avoidance does not work. Desensitization is when a patient is given ongoing shots until the body becomes less sensitive to the allergen. Make sure you follow up with your health care provider if problems continue. °HOME CARE INSTRUCTIONS °It is not possible to completely avoid allergens, but you can reduce your symptoms by taking steps to limit your exposure to them. It helps to know exactly what you are allergic to so that you can avoid your specific triggers. °SEEK MEDICAL CARE IF: °· You have a fever. °· You develop a cough that does not stop easily (persistent). °· You have shortness of breath. °· You start wheezing. °· Symptoms interfere with normal daily activities. °  °This information is not intended to  replace advice given to you by your health care provider. Make sure you discuss any questions you have with your health care provider. °  °Document Released: 06/25/2001 Document Revised: 10/21/2014 Document Reviewed: 06/07/2013 °Elsevier Interactive Patient Education ©2016 Elsevier Inc. ° °

## 2016-01-06 NOTE — ED Notes (Signed)
Cough snd chills x 5 days.

## 2016-01-06 NOTE — ED Notes (Signed)
AAOx3.  No SOB/ DOE.  NAD.

## 2016-01-06 NOTE — ED Provider Notes (Signed)
[facility] Emergency Department Provider Note  ____________________________________________  Time seen: Approximately 5:25 PM  I have reviewed the triage vital signs and the nursing notes.   HISTORY  Chief Complaint Cough    HPI Douglas Abbott. is a 50 y.o. male , NAD, presents to the emergency department for five-day history of cough, nasal congestion, chills. States he was at work on Monday evening and notes he is not supposed to be exposed to certain chemicals and allergens. States he is on FMLA for such under his primary care provider. Unfortunately he was exposed to an allergen which she is unsure of and had onset of scratchy sore throat and runny nose that evening. Has felt ill with loss of appetite since Tuesday. Has had some cough and chest congestion along with continued nasal congestion and runny nose. Has been taking Benadryl with no resolution of symptoms. Took Mucinex yesterday with no resolution. Had any shortness of breath or difficulty breathing. Denies chest pain, abdominal pain, nausea, vomiting, diarrhea.   Past Medical History  Diagnosis Date  . Asthma   . Allergic rhinitis   . History of pleurisy   . Decreased libido   . Allergy     Patient Active Problem List   Diagnosis Date Noted  . Blood in the urine 09/11/2015  . Routine medical exam 04/05/2015  . Routine general medical examination at a health care facility 04/05/2015  . Asthma, chronic 03/09/2015  . Allergic rhinitis 03/09/2015    Past Surgical History  Procedure Laterality Date  . No past surgeries      Current Outpatient Rx  Name  Route  Sig  Dispense  Refill  . albuterol (PROVENTIL HFA;VENTOLIN HFA) 108 (90 Base) MCG/ACT inhaler   Inhalation   Inhale 1-2 puffs into the lungs every 6 (six) hours as needed for wheezing or shortness of breath.   1 Inhaler   0   . brompheniramine-pseudoephedrine-DM 30-2-10 MG/5ML syrup   Oral   Take 10 mLs by mouth 4 (four)  times daily as needed.   200 mL   0   . fluticasone (FLONASE) 50 MCG/ACT nasal spray   Each Nare   Place 2 sprays into both nostrils daily.   16 g   0   . fluticasone furoate-vilanterol (BREO ELLIPTA) 100-25 MCG/INH AEPB   Inhalation   Inhale 1 puff into the lungs daily.   30 each   5   . montelukast (SINGULAIR) 10 MG tablet   Oral   Take 10 mg by mouth at bedtime.         Marland Kitchen omeprazole (PRILOSEC) 20 MG capsule      take 1 capsule by mouth twice a day   60 capsule   2   . sildenafil (VIAGRA) 100 MG tablet   Oral   Take 0.5-1 tablets (50-100 mg total) by mouth daily as needed for erectile dysfunction.   5 tablet   11     Allergies Review of patient's allergies indicates no known allergies.  Family History  Problem Relation Age of Onset  . Asthma Mother   . Lung cancer Mother   . Kidney disease Mother     Social History Social History  Substance Use Topics  . Smoking status: Former Smoker -- 1.00 packs/day for 8 years    Types: Cigarettes  . Smokeless tobacco: None  . Alcohol Use: 0.0 oz/week    0 Standard drinks or equivalent per week     Comment: occassinal  Review of Systems  Constitutional: Positive chills, decreased appetite. No fever, rigors Eyes: Positive for itchy, watery eyes. No visual changes. No redness, swelling ENT: Positive itchy sore throat, nasal congestion, runny nose. No sinus pressure or ear pain. Cardiovascular: No chest pain. Respiratory: Positive chest congestion, cough. No shortness of breath. No wheezing.  Gastrointestinal: No abdominal pain.  No nausea, vomiting.  No diarrhea.  Musculoskeletal: Negative for general myalgias.  Skin: Negative for rash. Neurological: Negative for headaches, focal weakness or numbness. 10-point ROS otherwise negative.  ____________________________________________   PHYSICAL EXAM:  VITAL SIGNS: ED Triage Vitals  Enc Vitals Group     BP 01/06/16 1658 134/102 mmHg     Pulse Rate  01/06/16 1658 84     Resp 01/06/16 1658 20     Temp 01/06/16 1658 99.2 F (37.3 C)     Temp Source 01/06/16 1658 Oral     SpO2 01/06/16 1658 99 %     Weight 01/06/16 1658 170 lb (77.111 kg)     Height 01/06/16 1658 6\' 1"  (1.854 m)     Head Cir --      Peak Flow --      Pain Score 01/06/16 1658 5     Pain Loc --      Pain Edu? --      Excl. in [place]? --     Constitutional: Alert and oriented. Well appearing and in no acute distress. Eyes: Conjunctivae are normal. PERRL. EOMI without pain.  Head: Atraumatic. ENT:      Ears: TMs visualized bilaterally without effusion, bulging, perforation, erythema. Light reflex within normal limits bilaterally.      Nose: Mild congestion with trace clear rhinnorhea.      Mouth/Throat: Mucous membranes are moist. Clear postnasal drip. Pharynx without erythema, swelling, exudates. Neck: Supple with full range of motion. Hematological/Lymphatic/Immunilogical: No cervical lymphadenopathy. Cardiovascular: Normal rate, regular rhythm. Normal S1 and S2.  Good peripheral circulation. Respiratory: Normal respiratory effort without tachypnea or retractions. Lungs CTAB with breath sounds noted throughout all lung fields. Neurologic:  Normal speech and language. No gross focal neurologic deficits are appreciated.  Skin:  Skin is warm, dry and intact. No rash noted. Psychiatric: Mood and affect are normal. Speech and behavior are normal. Patient exhibits appropriate insight and judgement.   ____________________________________________   LABS  None  ____________________________________________  EKG  None ____________________________________________  RADIOLOGY I have personally viewed and evaluated these images (plain radiographs) as part of my medical decision making, as well as reviewing the written report by the radiologist.  Dg Chest 2 View  01/06/2016  CLINICAL DATA:  Intermittent cough and fevers EXAM: CHEST  2 VIEW COMPARISON:  02/21/2015  FINDINGS: The heart size and mediastinal contours are within normal limits. Both lungs are clear. The visualized skeletal structures are unremarkable. IMPRESSION: No active cardiopulmonary disease. Electronically Signed   By: Inez Catalina M.D.   On: 01/06/2016 17:50    ____________________________________________    PROCEDURES  Procedure(s) performed: None    Medications - No data to display   ____________________________________________   INITIAL IMPRESSION / ASSESSMENT AND PLAN / ED COURSE  Pertinent imaging results that were available during my care of the patient were reviewed by me and considered in my medical decision making (see chart for details).  Patient's diagnosis is consistent with viral upper respiratory infection and allergic rhinitis. Patient will be discharged home with prescriptions for Bromfed-DM, Flonase, albuterol inhaler to use as directed. May take Tylenol or ibuprofen as needed  for fever or aches. Patient given a work excuse for today only. Patient is to follow up with his primary care provider if symptoms persist past this treatment course. Patient is given ED precautions to return to the ED for any worsening or new symptoms.    ____________________________________________  FINAL CLINICAL IMPRESSION(S) / ED DIAGNOSES  Final diagnoses:  Viral upper respiratory infection  Allergic rhinitis, unspecified allergic rhinitis type      NEW MEDICATIONS STARTED DURING THIS VISIT:  New Prescriptions   ALBUTEROL (PROVENTIL HFA;VENTOLIN HFA) 108 (90 BASE) MCG/ACT INHALER    Inhale 1-2 puffs into the lungs every 6 (six) hours as needed for wheezing or shortness of breath.   BROMPHENIRAMINE-PSEUDOEPHEDRINE-DM 30-2-10 MG/5ML SYRUP    Take 10 mLs by mouth 4 (four) times daily as needed.   FLUTICASONE (FLONASE) 50 MCG/ACT NASAL SPRAY    Place 2 sprays into both nostrils daily.         Braxton Feathers, PA-C 01/06/16 1807  Daymon Larsen, MD 01/06/16 Karl Bales

## 2016-01-07 ENCOUNTER — Other Ambulatory Visit: Payer: Self-pay | Admitting: Family Medicine

## 2016-03-12 ENCOUNTER — Ambulatory Visit: Payer: 59 | Admitting: Family Medicine

## 2016-04-23 ENCOUNTER — Telehealth: Payer: Self-pay | Admitting: Family Medicine

## 2016-04-23 ENCOUNTER — Encounter: Payer: Self-pay | Admitting: Family Medicine

## 2016-04-23 ENCOUNTER — Ambulatory Visit (INDEPENDENT_AMBULATORY_CARE_PROVIDER_SITE_OTHER): Payer: 59 | Admitting: Family Medicine

## 2016-04-23 VITALS — BP 110/70 | HR 93 | Temp 98.7°F | Resp 18 | Ht 73.0 in | Wt 165.3 lb

## 2016-04-23 DIAGNOSIS — Z202 Contact with and (suspected) exposure to infections with a predominantly sexual mode of transmission: Secondary | ICD-10-CM

## 2016-04-23 NOTE — Progress Notes (Signed)
Name: Douglas Abbott.   MRN: [id number]    DOB: 1966-06-15   Date:04/23/2016       Progress Note  Subjective  Chief Complaint  Chief Complaint  Patient presents with  . Exposure to STD    Wife test postive for BV and Trichimonas  . Asthma    Medication refills  This patient is normally followed by Dr. Rutherford Nail, new to me.  HPI  Possible exposure to STD:   Pt. Presents for initial testing for possible STD exposure. His wife apparently tested positive for Bacterial vaginosis and Trichomonas and was advised by her to come in for testing. He is sexually active with his wife, no other sexual partners, states may not have 'cleaned their toys', has not noticed any penile discharge, burning on urination, fevers, chills, or lymphadenopathy.   Past Medical History  Diagnosis Date  . Asthma   . Allergic rhinitis   . History of pleurisy   . Decreased libido   . Allergy     Past Surgical History  Procedure Laterality Date  . No past surgeries      Family History  Problem Relation Age of Onset  . Asthma Mother   . Lung cancer Mother   . Kidney disease Mother     Social History   Social History  . Marital Status: Married    Spouse Name: N/A  . Number of Children: N/A  . Years of Education: N/A   Occupational History  . [employer]   Social History Main Topics  . Smoking status: Former Smoker -- 1.00 packs/day for 8 years    Types: Cigarettes  . Smokeless tobacco: Not on file  . Alcohol Use: 0.0 oz/week    0 Standard drinks or equivalent per week     Comment: occassinal  . Drug Use: Yes     Comment: occasional marijuana use  . Sexual Activity:    Partners: Female   Other Topics Concern  . Not on file   Social History Narrative     Current outpatient prescriptions:  .  albuterol (PROVENTIL HFA;VENTOLIN HFA) 108 (90 Base) MCG/ACT inhaler, Inhale 1-2 puffs into the lungs every 6 (six) hours as needed for wheezing or shortness of breath., Disp: 1 Inhaler,  Rfl: 0 .  fluticasone (FLONASE) 50 MCG/ACT nasal spray, Place 2 sprays into both nostrils daily., Disp: 16 g, Rfl: 0 .  fluticasone furoate-vilanterol (BREO ELLIPTA) 100-25 MCG/INH AEPB, Inhale 1 puff into the lungs daily., Disp: 30 each, Rfl: 5 .  montelukast (SINGULAIR) 10 MG tablet, take 1 tablet by mouth at bedtime, Disp: 30 tablet, Rfl: 7 .  omeprazole (PRILOSEC) 20 MG capsule, take 1 capsule by mouth twice a day, Disp: 60 capsule, Rfl: 2 .  sildenafil (VIAGRA) 100 MG tablet, Take 0.5-1 tablets (50-100 mg total) by mouth daily as needed for erectile dysfunction., Disp: 5 tablet, Rfl: 11  No Known Allergies   Review of Systems  HENT: Negative for sore throat.   Respiratory: Negative for cough and shortness of breath.   Gastrointestinal: Negative for nausea and vomiting.  Genitourinary: Negative for dysuria and hematuria.  Musculoskeletal: Negative for myalgias.    Objective  Filed Vitals:   04/23/16 1426  BP: 110/70  Pulse: 93  Temp: 98.7 F (37.1 C)  TempSrc: Oral  Resp: 18  Height: 6\' 1"  (1.854 m)  Weight: 165 lb 4.8 oz (74.98 kg)  SpO2: 96%    Physical Exam  Constitutional: He is oriented to person, place,  and time and well-developed, well-nourished, and in no distress.  Genitourinary: Penis normal. Penis exhibits no lesions and no edema. No discharge found.  Lymphadenopathy:       Right: No inguinal adenopathy present.       Left: No inguinal adenopathy present.  Neurological: He is alert and oriented to person, place, and time.  Psychiatric: Mood, memory, affect and judgment normal.  Nursing note and vitals reviewed.    Assessment & Plan  1. Possible exposure to STD Obtain STD panel, including for bacterial vaginosis treat as appropriate. - Chlamydia/Gonococcus/Trichomonas, NAA - STD Panel (HBSAG,HIV,RPR)   Elih Mooney Asad A. [facility] 04/23/2016 2:43 PM

## 2016-04-23 NOTE — Telephone Encounter (Signed)
Please inform patient that we'll need to first obtain and review his test results before prescribing any medication.

## 2016-04-23 NOTE — Telephone Encounter (Signed)
Patient was seen today and was told that a prescription would be sent to his pharmacy. Patient is at pharmacy waiting.

## 2016-04-24 LAB — STD PANEL
HEP B S AG: NEGATIVE
HIV 1&2 Ab, 4th Generation: NONREACTIVE

## 2016-04-24 LAB — TRICHOMONAS VAGINALIS, PROBE AMP: T vaginalis RNA: DETECTED — AB

## 2016-04-24 LAB — GC/CHLAMYDIA PROBE AMP
CT PROBE, AMP APTIMA: NOT DETECTED
GC Probe RNA: NOT DETECTED

## 2016-04-24 NOTE — Telephone Encounter (Signed)
Patient verbally informed °

## 2016-04-25 ENCOUNTER — Telehealth: Payer: Self-pay | Admitting: Family Medicine

## 2016-04-25 NOTE — Telephone Encounter (Signed)
Pt states he would like a call back about his visit. He request a call back from [place].

## 2016-04-26 ENCOUNTER — Other Ambulatory Visit: Payer: Self-pay | Admitting: Emergency Medicine

## 2016-04-26 MED ORDER — METRONIDAZOLE 500 MG PO TABS: 500.0000 mg | ORAL_TABLET | Freq: Four times a day (QID) | ORAL | Status: DC

## 2016-04-26 NOTE — Telephone Encounter (Signed)
Patient notified of lab results. Script called in

## 2016-04-26 NOTE — Telephone Encounter (Signed)
Patient notified of results.

## 2016-05-14 ENCOUNTER — Other Ambulatory Visit: Payer: Self-pay | Admitting: Family Medicine

## 2016-07-03 ENCOUNTER — Encounter: Payer: Self-pay | Admitting: Family Medicine

## 2016-07-03 ENCOUNTER — Ambulatory Visit (INDEPENDENT_AMBULATORY_CARE_PROVIDER_SITE_OTHER): Payer: 59 | Admitting: Family Medicine

## 2016-07-03 VITALS — BP 108/76 | HR 71 | Temp 98.1°F | Resp 15 | Ht 73.0 in | Wt 159.7 lb

## 2016-07-03 DIAGNOSIS — J343 Hypertrophy of nasal turbinates: Secondary | ICD-10-CM

## 2016-07-03 DIAGNOSIS — R05 Cough: Secondary | ICD-10-CM | POA: Diagnosis not present

## 2016-07-03 DIAGNOSIS — R0989 Other specified symptoms and signs involving the circulatory and respiratory systems: Secondary | ICD-10-CM

## 2016-07-03 DIAGNOSIS — R058 Other specified cough: Secondary | ICD-10-CM | POA: Insufficient documentation

## 2016-07-03 MED ORDER — AZITHROMYCIN 250 MG PO TABS: ORAL_TABLET | ORAL | 0 refills | Status: DC

## 2016-07-03 MED ORDER — PREDNISONE 10 MG (21) PO TBPK: 10.0000 mg | ORAL_TABLET | Freq: Every day | ORAL | 0 refills | Status: DC

## 2016-07-03 MED ORDER — FLUTICASONE PROPIONATE 50 MCG/ACT NA SUSP: 2.0000 | Freq: Every day | NASAL | 1 refills | Status: DC

## 2016-07-03 NOTE — Progress Notes (Signed)
Name: Douglas Abbott.   MRN: [id number]    DOB: 29-Apr-1966   Date:07/03/2016       Progress Note  Subjective  Chief Complaint  Chief Complaint  Patient presents with  . Asthma    HPI  Pt. Presents for chest tightness and congestion. He has history of Asthma controlled on maintenance meds (Breo and Proventil). He crawled underneath his house on Saturday and as he was backing out to exit the crawl space, he started sneezing, progressively getting worse. That night, he started having worsening cough, phlegm, and nasal congestion. He took Benadryl which initially made his symptoms worse but then they got better.  He feels a lot better now compared to 3 days ago but still has tightness in his chest, cough with mucus production.    Past Medical History:  Diagnosis Date  . Allergic rhinitis   . Allergy   . Asthma   . Decreased libido   . History of pleurisy     Past Surgical History:  Procedure Laterality Date  . NO PAST SURGERIES      Family History  Problem Relation Age of Onset  . Asthma Mother   . Lung cancer Mother   . Kidney disease Mother     Social History   Social History  . Marital status: Married    Spouse name: N/A  . Number of children: N/A  . Years of education: N/A   Occupational History  . [employer]   Social History Main Topics  . Smoking status: Former Smoker    Packs/day: 1.00    Years: 8.00    Types: Cigarettes  . Smokeless tobacco: Never Used  . Alcohol use 0.0 oz/week     Comment: occassinal  . Drug use:      Comment: occasional marijuana use  . Sexual activity: Yes    Partners: Female   Other Topics Concern  . Not on file   Social History Narrative  . No narrative on file     Current Outpatient Prescriptions:  .  albuterol (PROVENTIL HFA;VENTOLIN HFA) 108 (90 Base) MCG/ACT inhaler, Inhale 1-2 puffs into the lungs every 6 (six) hours as needed for wheezing or shortness of breath., Disp: 1 Inhaler, Rfl: 0 .  fluticasone  (FLONASE) 50 MCG/ACT nasal spray, Place 2 sprays into both nostrils daily., Disp: 16 g, Rfl: 0 .  fluticasone furoate-vilanterol (BREO ELLIPTA) 100-25 MCG/INH AEPB, Inhale 1 puff into the lungs daily., Disp: 30 each, Rfl: 5 .  metroNIDAZOLE (FLAGYL) 500 MG tablet, Take 1 tablet (500 mg total) by mouth 4 (four) times daily. One time dose, Disp: 4 tablet, Rfl: 0 .  montelukast (SINGULAIR) 10 MG tablet, take 1 tablet by mouth at bedtime, Disp: 30 tablet, Rfl: 7 .  omeprazole (PRILOSEC) 20 MG capsule, take 1 capsule by mouth twice a day, Disp: 60 capsule, Rfl: 2 .  sildenafil (VIAGRA) 100 MG tablet, Take 0.5-1 tablets (50-100 mg total) by mouth daily as needed for erectile dysfunction. (Patient not taking: Reported on 07/03/2016), Disp: 5 tablet, Rfl: 11  No Known Allergies   Review of Systems  Constitutional: Positive for chills and malaise/fatigue. Negative for fever.  HENT: Positive for congestion.   Respiratory: Positive for cough and shortness of breath.   Cardiovascular: Negative for chest pain.    Objective  Vitals:   07/03/16 0950  BP: 108/76  Pulse: 71  Resp: 15  Temp: 98.1 F (36.7 C)  TempSrc: Oral  SpO2: 97%  Weight: 159  lb 11.2 oz (72.4 kg)  Height: 6\' 1"  (1.854 m)    Physical Exam  Constitutional: He is well-developed, well-nourished, and in no distress.  HENT:  Mouth/Throat: Posterior oropharyngeal erythema present.  Nasal turbinate hypertrophy, mucosal inflammation.  Cardiovascular: Normal rate, regular rhythm, S1 normal and S2 normal.   No murmur heard. Pulmonary/Chest: He has no decreased breath sounds. He has wheezes (faint single expiratory wheeze in right lower lung field.) in the right lower field. He has no rhonchi. He has no rales.  Neurological: He is alert.  Psychiatric: Mood, memory, affect and judgment normal.  Nursing note and vitals reviewed.     Assessment & Plan  1. Chest congestion  - azithromycin (ZITHROMAX) 250 MG tablet; 2 tabs po day  1, then 1 tab po q day x 4 days  Dispense: 6 each; Refill: 0  2. Cough productive of purulent sputum Concern for asthma exacerbation with persistent coughing. We'll start on antibiotic therapy - predniSONE (STERAPRED UNI-PAK 21 TAB) 10 MG (21) TBPK tablet; Take 1 tablet (10 mg total) by mouth daily. 60 50 40 30 20 10  then STOP  Dispense: 21 tablet; Refill: 0  3. Hypertrophy of nasal turbinates  - fluticasone (FLONASE) 50 MCG/ACT nasal spray; Place 2 sprays into both nostrils daily.  Dispense: 16 g; Refill: 1   Amaani Guilbault Asad A[facility] 07/03/2016 9:55 AM

## 2016-07-04 ENCOUNTER — Telehealth: Payer: Self-pay | Admitting: Family Medicine

## 2016-07-04 NOTE — Telephone Encounter (Signed)
CLOSE ENCOUNTER °

## 2016-07-04 NOTE — Telephone Encounter (Signed)
ERRENOUS °

## 2016-07-04 NOTE — Telephone Encounter (Signed)
COMPLETED

## 2016-10-28 ENCOUNTER — Other Ambulatory Visit: Payer: Self-pay | Admitting: Family Medicine

## 2016-10-28 DIAGNOSIS — R058 Other specified cough: Secondary | ICD-10-CM

## 2016-10-28 DIAGNOSIS — R05 Cough: Secondary | ICD-10-CM

## 2017-01-02 ENCOUNTER — Ambulatory Visit (INDEPENDENT_AMBULATORY_CARE_PROVIDER_SITE_OTHER): Payer: 59 | Admitting: Family Medicine

## 2017-01-02 ENCOUNTER — Encounter: Payer: Self-pay | Admitting: Family Medicine

## 2017-01-02 VITALS — BP 124/82 | HR 80 | Temp 97.9°F | Resp 16 | Ht 73.0 in | Wt 167.8 lb

## 2017-01-02 DIAGNOSIS — J4540 Moderate persistent asthma, uncomplicated: Secondary | ICD-10-CM | POA: Diagnosis not present

## 2017-01-02 DIAGNOSIS — K219 Gastro-esophageal reflux disease without esophagitis: Secondary | ICD-10-CM | POA: Diagnosis not present

## 2017-01-02 DIAGNOSIS — J301 Allergic rhinitis due to pollen: Secondary | ICD-10-CM | POA: Diagnosis not present

## 2017-01-02 MED ORDER — ALBUTEROL SULFATE HFA 108 (90 BASE) MCG/ACT IN AERS: 1.0000 | INHALATION_SPRAY | Freq: Four times a day (QID) | RESPIRATORY_TRACT | 2 refills | Status: DC | PRN

## 2017-01-02 MED ORDER — BUDESONIDE-FORMOTEROL FUMARATE 80-4.5 MCG/ACT IN AERO: 2.0000 | INHALATION_SPRAY | Freq: Two times a day (BID) | RESPIRATORY_TRACT | 2 refills | Status: DC

## 2017-01-02 MED ORDER — OMEPRAZOLE 20 MG PO CPDR: 20.0000 mg | DELAYED_RELEASE_CAPSULE | Freq: Two times a day (BID) | ORAL | 1 refills | Status: DC

## 2017-01-02 MED ORDER — FLUTICASONE PROPIONATE 50 MCG/ACT NA SUSP: 2.0000 | Freq: Every day | NASAL | 1 refills | Status: DC

## 2017-01-02 MED ORDER — MONTELUKAST SODIUM 10 MG PO TABS: 10.0000 mg | ORAL_TABLET | Freq: Every day | ORAL | 1 refills | Status: DC

## 2017-01-02 NOTE — Progress Notes (Signed)
Name: Douglas Abbott.   MRN: [id number]    DOB: 08-17-1966   Date:01/02/2017       Progress Note  Subjective  Chief Complaint  Chief Complaint  Patient presents with  . Asthma    follow up, medication refills    Asthma  He complains of chest tightness, cough and wheezing. This is a recurrent problem. The cough is productive of sputum. Pertinent negatives include no heartburn or sore throat. His symptoms are aggravated by exposure to fumes, exposure to smoke, pollen, occupational exposure and change in weather. His symptoms are alleviated by beta-agonist, leukotriene antagonist and steroid inhaler. He reports significant improvement on treatment. His past medical history is significant for asthma.  Gastroesophageal Reflux  He complains of coughing and wheezing. He reports no abdominal pain, no belching, no heartburn or no sore throat. This is a chronic problem. The problem has been unchanged. He has tried a PPI for the symptoms.     Past Medical History:  Diagnosis Date  . Allergic rhinitis   . Allergy   . Asthma   . Decreased libido   . History of pleurisy     Past Surgical History:  Procedure Laterality Date  . NO PAST SURGERIES      Family History  Problem Relation Age of Onset  . Asthma Mother   . Lung cancer Mother   . Kidney disease Mother     Social History   Social History  . Marital status: Married    Spouse name: N/A  . Number of children: N/A  . Years of education: N/A   Occupational History  . [employer]   Social History Main Topics  . Smoking status: Former Smoker    Packs/day: 1.00    Years: 8.00    Types: Cigarettes  . Smokeless tobacco: Never Used  . Alcohol use 0.0 oz/week     Comment: occassinal  . Drug use: Yes     Comment: occasional marijuana use  . Sexual activity: Yes    Partners: Female   Other Topics Concern  . Not on file   Social History Narrative  . No narrative on file     Current Outpatient Prescriptions:   .  albuterol (PROVENTIL HFA;VENTOLIN HFA) 108 (90 Base) MCG/ACT inhaler, Inhale 1-2 puffs into the lungs every 6 (six) hours as needed for wheezing or shortness of breath., Disp: 1 Inhaler, Rfl: 0 .  fluticasone (FLONASE) 50 MCG/ACT nasal spray, Place 2 sprays into both nostrils daily., Disp: 16 g, Rfl: 1 .  fluticasone furoate-vilanterol (BREO ELLIPTA) 100-25 MCG/INH AEPB, Inhale 1 puff into the lungs daily., Disp: 30 each, Rfl: 5 .  montelukast (SINGULAIR) 10 MG tablet, take 1 tablet by mouth at bedtime, Disp: 30 tablet, Rfl: 7 .  omeprazole (PRILOSEC) 20 MG capsule, take 1 capsule by mouth twice a day, Disp: 60 capsule, Rfl: 2 .  sildenafil (VIAGRA) 100 MG tablet, Take 0.5-1 tablets (50-100 mg total) by mouth daily as needed for erectile dysfunction., Disp: 5 tablet, Rfl: 11  No Known Allergies   Review of Systems  HENT: Negative for sore throat.   Respiratory: Positive for cough and wheezing.   Gastrointestinal: Negative for abdominal pain and heartburn.     Objective  Vitals:   01/02/17 1002  BP: 124/82  Pulse: 80  Resp: 16  Temp: 97.9 F (36.6 C)  TempSrc: Oral  SpO2: 98%  Weight: 167 lb 12.8 oz (76.1 kg)  Height: 6\' 1"  (1.854 m)  Physical Exam  Constitutional: He is oriented to person, place, and time and well-developed, well-nourished, and in no distress.  HENT:  Head: Normocephalic and atraumatic.  Mouth/Throat: Posterior oropharyngeal erythema present. No oropharyngeal exudate or posterior oropharyngeal edema.  Nasal turbinate hypertrophy, mucosal inflammation.  Cardiovascular: Normal rate, regular rhythm and normal heart sounds.   No murmur heard. Pulmonary/Chest: Effort normal and breath sounds normal. He has no wheezes.  Abdominal: Soft. Bowel sounds are normal. There is no tenderness.  Neurological: He is alert and oriented to person, place, and time.  Psychiatric: Mood, memory, affect and judgment normal.  Nursing note and vitals  reviewed.     Assessment & Plan  1. Chronic allergic rhinitis due to pollen, unspecified seasonality From seasonal and occupational exposure, continue on Flonase - fluticasone (FLONASE) 50 MCG/ACT nasal spray; Place 2 sprays into both nostrils daily.  Dispense: 16 g; Refill: 1  2. Moderate persistent chronic asthma without complication Changed from by mouth to Symbicort, continue present management - budesonide-formoterol (SYMBICORT) 80-4.5 MCG/ACT inhaler; Inhale 2 puffs into the lungs 2 (two) times daily.  Dispense: 3 Inhaler; Refill: 2 - albuterol (PROVENTIL HFA;VENTOLIN HFA) 108 (90 Base) MCG/ACT inhaler; Inhale 1-2 puffs into the lungs every 6 (six) hours as needed for wheezing or shortness of breath.  Dispense: 3 Inhaler; Refill: 2 - montelukast (SINGULAIR) 10 MG tablet; Take 1 tablet (10 mg total) by mouth at bedtime.  Dispense: 90 tablet; Refill: 1  3. Gastroesophageal reflux disease, esophagitis presence not specified  - omeprazole (PRILOSEC) 20 MG capsule; Take 1 capsule (20 mg total) by mouth 2 (two) times daily.  Dispense: 180 capsule; Refill: 1   Douglas Abbott Asad A[facility] 01/02/2017 10:29 AM

## 2017-02-07 ENCOUNTER — Encounter: Payer: 59 | Admitting: Family Medicine

## 2017-02-27 ENCOUNTER — Ambulatory Visit (INDEPENDENT_AMBULATORY_CARE_PROVIDER_SITE_OTHER): Payer: 59 | Admitting: Family Medicine

## 2017-02-27 ENCOUNTER — Encounter: Payer: Self-pay | Admitting: Family Medicine

## 2017-02-27 VITALS — BP 122/79 | HR 60 | Temp 97.9°F | Resp 16 | Ht 73.0 in | Wt 168.8 lb

## 2017-02-27 DIAGNOSIS — Z0000 Encounter for general adult medical examination without abnormal findings: Secondary | ICD-10-CM | POA: Diagnosis not present

## 2017-02-27 DIAGNOSIS — R9431 Abnormal electrocardiogram [ECG] [EKG]: Secondary | ICD-10-CM

## 2017-02-27 LAB — COMPLETE METABOLIC PANEL WITH GFR
ALBUMIN: 4.3 g/dL (ref 3.6–5.1)
ALK PHOS: 109 U/L (ref 40–115)
ALT: 19 U/L (ref 9–46)
AST: 22 U/L (ref 10–40)
BUN: 15 mg/dL (ref 7–25)
CO2: 27 mmol/L (ref 20–31)
Calcium: 9.4 mg/dL (ref 8.6–10.3)
Chloride: 104 mmol/L (ref 98–110)
Creat: 1.04 mg/dL (ref 0.60–1.35)
GFR, Est African American: >89 mL/min (ref >=60)
GFR, Est Non African American: 84 mL/min (ref >=60)
GLUCOSE: 83 mg/dL (ref 65–99)
Potassium: 4.4 mmol/L (ref 3.5–5.3)
Sodium: 140 mmol/L (ref 135–146)
Total Bilirubin: 0.9 mg/dL (ref 0.2–1.2)
Total Protein: 6.4 g/dL (ref 6.1–8.1)

## 2017-02-27 LAB — CBC WITH DIFFERENTIAL/PLATELET
Basophils Absolute: 42 cells/uL (ref 0–200)
Basophils Relative: 1 %
EOS ABS: 168 {cells}/uL (ref 15–500)
EOS PCT: 4 %
HCT: 42.1 % (ref 38.5–50.0)
Hemoglobin: 13.8 g/dL (ref 13.2–17.1)
Lymphocytes Relative: 32 %
Lymphs Abs: 1344 cells/uL (ref 850–3900)
MCH: 29.2 pg (ref 27.0–33.0)
MCHC: 32.8 g/dL (ref 32.0–36.0)
MCV: 89 fL (ref 80.0–100.0)
MPV: 8.9 fL (ref 7.5–12.5)
Monocytes Absolute: 420 cells/uL (ref 200–950)
Monocytes Relative: 10 %
NEUTROS ABS: 2226 {cells}/uL (ref 1500–7800)
Neutrophils Relative %: 53 %
PLATELETS: 294 10*3/uL (ref 140–400)
RBC: 4.73 MIL/uL (ref 4.20–5.80)
RDW: 16.2 % — ABNORMAL HIGH (ref 11.0–15.0)
WBC: 4.2 10*3/uL (ref 3.8–10.8)

## 2017-02-27 LAB — LIPID PANEL
CHOL/HDL RATIO: 2.4 ratio (ref <5.0)
Cholesterol: 177 mg/dL (ref <200)
HDL: 75 mg/dL (ref >40)
LDL CALC: 92 mg/dL (ref <100)
Triglycerides: 49 mg/dL (ref <150)
VLDL: 10 mg/dL (ref <30)

## 2017-02-27 LAB — TSH: TSH: 0.8 m[IU]/L (ref 0.40–4.50)

## 2017-02-27 NOTE — Progress Notes (Signed)
Name: Douglas Abbott.   MRN: [id number]    DOB: 1966-08-17   Date:02/27/2017       Progress Note  Subjective  Chief Complaint  Chief Complaint  Patient presents with  . Annual Exam    CPE    HPI  Pt. Presents for Annual Physical Exam.  Had '2 or 3' colonoscopies in last 10 years, had polyps which were non-cancerous, no records available. He is due for prostate cancer screening.     Past Medical History:  Diagnosis Date  . Allergic rhinitis   . Allergy   . Asthma   . Decreased libido   . History of pleurisy     Past Surgical History:  Procedure Laterality Date  . NO PAST SURGERIES      Family History  Problem Relation Age of Onset  . Asthma Mother   . Lung cancer Mother   . Kidney disease Mother     Social History   Social History  . Marital status: Married    Spouse name: N/A  . Number of children: N/A  . Years of education: N/A   Occupational History  . [employer]   Social History Main Topics  . Smoking status: Former Smoker    Packs/day: 1.00    Years: 8.00    Types: Cigarettes  . Smokeless tobacco: Never Used  . Alcohol use 0.0 oz/week     Comment: occassinal  . Drug use: Yes     Comment: occasional marijuana use  . Sexual activity: Yes    Partners: Female   Other Topics Concern  . Not on file   Social History Narrative  . No narrative on file     Current Outpatient Prescriptions:  .  albuterol (PROVENTIL HFA;VENTOLIN HFA) 108 (90 Base) MCG/ACT inhaler, Inhale 1-2 puffs into the lungs every 6 (six) hours as needed for wheezing or shortness of breath., Disp: 3 Inhaler, Rfl: 2 .  budesonide-formoterol (SYMBICORT) 80-4.5 MCG/ACT inhaler, Inhale 2 puffs into the lungs 2 (two) times daily., Disp: 3 Inhaler, Rfl: 2 .  fluticasone (FLONASE) 50 MCG/ACT nasal spray, Place 2 sprays into both nostrils daily., Disp: 16 g, Rfl: 1 .  montelukast (SINGULAIR) 10 MG tablet, Take 1 tablet (10 mg total) by mouth at bedtime., Disp: 90 tablet, Rfl:  1 .  omeprazole (PRILOSEC) 20 MG capsule, Take 1 capsule (20 mg total) by mouth 2 (two) times daily., Disp: 180 capsule, Rfl: 1  No Known Allergies   Review of Systems  Constitutional: Negative for chills, fever, malaise/fatigue and weight loss.  HENT: Negative for congestion, ear pain and sore throat.   Eyes: Positive for blurred vision (has blurry vision when reading, appt. w/ Ophthalmology). Negative for double vision.  Respiratory: Positive for shortness of breath. Negative for cough.   Cardiovascular: Negative for chest pain, palpitations and leg swelling.  Gastrointestinal: Negative for abdominal pain, blood in stool, heartburn, nausea and vomiting.  Genitourinary: Positive for hematuria (chronic benign hematuria). Negative for dysuria.  Musculoskeletal: Negative for back pain and neck pain.  Neurological: Negative for dizziness and headaches.  Psychiatric/Behavioral: Negative for depression. The patient is not nervous/anxious and does not have insomnia.     Objective  Vitals:   02/27/17 1008  BP: 122/79  Pulse: 60  Resp: 16  Temp: 97.9 F (36.6 C)  TempSrc: Oral  SpO2: 97%  Weight: 168 lb 12.8 oz (76.6 kg)  Height: 6\' 1"  (1.854 m)    Physical Exam  Constitutional: He is oriented  to person, place, and time and well-developed, well-nourished, and in no distress.  HENT:  Head: Normocephalic and atraumatic.  Right Ear: External ear normal.  Left Ear: External ear normal.  Nose: Nose normal.  Mouth/Throat: Oropharynx is clear and moist.  Eyes: Pupils are equal, round, and reactive to light.  Neck: Neck supple. No thyromegaly present.  Cardiovascular: Normal rate, regular rhythm and normal heart sounds.   No murmur heard. Pulmonary/Chest: Effort normal and breath sounds normal. He has no wheezes.  Abdominal: Soft. Bowel sounds are normal. There is no tenderness.  Genitourinary: Prostate normal.  Musculoskeletal: He exhibits no edema.  Neurological: He is alert and  oriented to person, place, and time.  Psychiatric: Mood, memory, affect and judgment normal.  Nursing note and vitals reviewed.      Assessment & Plan  1. Annual physical exam We'll obtain age-appropriate lab work - CBC with Differential/Platelet - COMPLETE METABOLIC PANEL WITH GFR - Lipid panel - TSH - VITAMIN D 25 Hydroxy (Vit-D Deficiency, Fractures) - PSA  2. Abnormal EKG EKG shows sinus bradycardia, patient is asymptomatic, no previous EKG available for comparison. Referral to cardiology for further evaluation and workup - Ambulatory referral to Cardiology - EKG 12-Lead   Douglas Abbott Asad A[facility] 02/27/2017 10:24 AM

## 2017-02-28 ENCOUNTER — Telehealth: Payer: Self-pay

## 2017-02-28 LAB — PSA: PSA: 1.3 ng/mL (ref <=4.0)

## 2017-02-28 LAB — VITAMIN D 25 HYDROXY (VIT D DEFICIENCY, FRACTURES): VIT D 25 HYDROXY: 13 ng/mL — AB (ref 30–100)

## 2017-02-28 MED ORDER — VITAMIN D (ERGOCALCIFEROL) 1.25 MG (50000 UNIT) PO CAPS: 50000.0000 [IU] | ORAL_CAPSULE | ORAL | 0 refills | Status: DC

## 2017-02-28 NOTE — Telephone Encounter (Signed)
Patient has been notified of lab results and a prescription for vitamin D3 50,000 units take 1 capsule once a week x12 weeks has been sent to [facility]. [facility] per Dr. Manuella Ghazi, patient has been notified and verbalized understanding

## 2017-03-04 ENCOUNTER — Ambulatory Visit (INDEPENDENT_AMBULATORY_CARE_PROVIDER_SITE_OTHER): Payer: 59 | Admitting: Internal Medicine

## 2017-03-04 ENCOUNTER — Encounter: Payer: Self-pay | Admitting: Internal Medicine

## 2017-03-04 VITALS — BP 94/64 | HR 63 | Ht 73.0 in | Wt 165.2 lb

## 2017-03-04 DIAGNOSIS — R001 Bradycardia, unspecified: Secondary | ICD-10-CM | POA: Diagnosis not present

## 2017-03-04 DIAGNOSIS — R0609 Other forms of dyspnea: Secondary | ICD-10-CM | POA: Diagnosis not present

## 2017-03-04 NOTE — Progress Notes (Signed)
New Outpatient Visit Date: 03/04/2017  Referring Provider: Roselee Nova, MD [address] [place], [place] [address]  Chief Complaint: Slow heart rate  HPI:  Douglas Abbott is a 51 y.o. male who is being seen today for the evaluation of sinus bradycardia at the request of Dr. Manuella Ghazi. He has a history of asthma and multiple food/environmental allergies. Douglas Abbott was recently seen by Dr. Manuella Ghazi for annual physical exam. At that time, EKG was notable for marked sinus bradycardia with a rate of 38 bpm. Douglas Abbott was asymptomatic. He denies a history of previous cardiovascular disease or cardiac testing. He has not had any chest pain, palpitations, lightheadedness, orthopnea, PND, edema, or claudication. He is very active at work and notes mild shortness of breath when he walks briskly or runs. He occasionally will have shortness of breath at rest as well, which he attributes to asthma. This resolves promptly with use of his as needed inhaler.  --------------------------------------------------------------------------------------------------  Cardiovascular History & Procedures: Cardiovascular Problems:  Sinus bradycardia  Risk Factors:  Male gender  Cath/PCI:  None  CV Surgery:  None  EP Procedures and Devices:  None  Non-Invasive Evaluation(s):  None  Recent CV Pertinent Labs: Lab Results  Component Value Date   CHOL 177 02/27/2017   CHOL 192 09/11/2015   HDL 75 02/27/2017   HDL 67 09/11/2015   LDLCALC 92 02/27/2017   LDLCALC 114 (H) 09/11/2015   TRIG 49 02/27/2017   CHOLHDL 2.4 02/27/2017   K 4.4 02/27/2017   BUN 15 02/27/2017   BUN 14 09/11/2015   CREATININE 1.04 02/27/2017    --------------------------------------------------------------------------------------------------  Past Medical History:  Diagnosis Date  . Allergic rhinitis   . Allergy   . Asthma   . Decreased libido   . History of pleurisy     Past Surgical History:    Procedure Laterality Date  . NO PAST SURGERIES      Outpatient Encounter Prescriptions as of 03/04/2017  Medication Sig  . albuterol (PROVENTIL HFA;VENTOLIN HFA) 108 (90 Base) MCG/ACT inhaler Inhale 1-2 puffs into the lungs every 6 (six) hours as needed for wheezing or shortness of breath.  . budesonide-formoterol (SYMBICORT) 80-4.5 MCG/ACT inhaler Inhale 2 puffs into the lungs 2 (two) times daily.  . fluticasone (FLONASE) 50 MCG/ACT nasal spray Place 2 sprays into both nostrils daily.  . montelukast (SINGULAIR) 10 MG tablet Take 1 tablet (10 mg total) by mouth at bedtime.  Marland Kitchen omeprazole (PRILOSEC) 20 MG capsule Take 1 capsule (20 mg total) by mouth 2 (two) times daily.  . Vitamin D, Ergocalciferol, (DRISDOL) 50000 units CAPS capsule Take 1 capsule (50,000 Units total) by mouth once a week. For 12 weeks   No facility-administered encounter medications on file as of 03/04/2017.     Allergies: Shellfish allergy  Social History   Social History  . Marital status: Married    Spouse name: N/A  . Number of children: N/A  . Years of education: N/A   Occupational History  . [employer]   Social History Main Topics  . Smoking status: Former Smoker    Packs/day: 1.00    Years: 8.00    Types: Cigarettes    Quit date: 2000  . Smokeless tobacco: Never Used  . Alcohol use 0.0 oz/week     Comment: occassinal beer (once a month); used to drink heavily but not for >3 years  . Drug use: Yes    Frequency: 1.0 time per week  Types: Marijuana     Comment: occasional marijuana use  . Sexual activity: Yes    Partners: Female   Other Topics Concern  . Not on file   Social History Narrative  . No narrative on file    Family History  Problem Relation Age of Onset  . Asthma Mother   . Lung cancer Mother   . Kidney disease Mother   . Heart disease Neg Hx     Review of Systems: A 12-system review of systems was performed and was negative except as noted in the  HPI.  --------------------------------------------------------------------------------------------------  Physical Exam: BP 94/64 (BP Location: Left Arm, Patient Position: Sitting, Cuff Size: Normal)   Pulse 63   Ht 6\' 1"  (1.854 m)   Wt 165 lb 4 oz (75 kg)   BMI 21.80 kg/m   General:  Slender man, seated comfortably in the exam room. He is accompanied by his wife. HEENT: No conjunctival pallor or scleral icterus.  Moist mucous membranes.  OP clear. Neck: Supple without lymphadenopathy, thyromegaly, JVD, or HJR.  No carotid bruit. Lungs: Normal work of breathing.  Clear to auscultation bilaterally without wheezes or crackles. Heart: Regular rate and rhythm without murmurs, rubs, or gallops.  Question parasternal heave. Abd: Bowel sounds present.  Soft, NT/ND without hepatosplenomegaly Ext: No lower extremity edema.  Radial, PT, and DP pulses are 2+ bilaterally Skin: warm and dry without rash Neuro: CNIII-XII intact.  Strength and fine-touch sensation intact in upper and lower extremities bilaterally. Psych: Normal mood and affect.  Ambulatory heart rate monitoring was performed in our office today. With walking, Douglas Abbott heart rate increased appropriately to 114 bpm and rapid returned back to baseline of 60-70 bpm.  EKG:  Normal sinus rhythm without significant abnormalities. Compared with prior tracing from 02/27/17, normal sinus rhythm has replaced sinus bradycardia (I have personally reviewed both tracings).  Lab Results  Component Value Date   WBC 4.2 02/27/2017   HGB 13.8 02/27/2017   HCT 42.1 02/27/2017   MCV 89.0 02/27/2017   PLT 294 02/27/2017    Lab Results  Component Value Date   NA 140 02/27/2017   K 4.4 02/27/2017   CL 104 02/27/2017   CO2 27 02/27/2017   BUN 15 02/27/2017   CREATININE 1.04 02/27/2017   GLUCOSE 83 02/27/2017   ALT 19 02/27/2017    Lab Results  Component Value Date   CHOL 177 02/27/2017   HDL 75 02/27/2017   LDLCALC 92 02/27/2017    TRIG 49 02/27/2017   CHOLHDL 2.4 02/27/2017   Lab Results  Component Value Date   TSH 0.80 02/27/2017   --------------------------------------------------------------------------------------------------  ASSESSMENT AND PLAN: Sinus bradycardia Heart rate today is normal, with EKG demonstrating normal sinus rhythm without abnormalities. Recent EKG by Dr. Manuella Ghazi demonstrated significant sinus bradycardia with a rate in the upper 30s. Douglas Abbott has been asymptomatic with the exception of mild exertional dyspnea. He demonstrated an appropriate chronotropic response to walking today. Recent labs were unrevealing, including normal potassium and TSH. Given his mild exertional dyspnea and sinus bradycardia, we have agreed to obtain a transthoracic echocardiogram to exclude structural abnormalities. If this is normal, we will defer further cardiac testing unless new symptoms develop.  Follow-up: Return to clinic in 3 months. If echo is normal, this appointment can be canceled and the Douglas Abbott can return as needed.  Nelva Bush, MD 03/04/2017 8:03 PM

## 2017-03-04 NOTE — Patient Instructions (Signed)
Medication Instructions:  Your physician recommends that you continue on your current medications as directed. Please refer to the Current Medication list given to you today.   Labwork: none  Testing/Procedures: Your physician has requested that you have an echocardiogram. Echocardiography is a painless test that uses sound waves to create images of your heart. It provides your doctor with information about the size and shape of your heart and how well your heart's chambers and valves are working. This procedure takes approximately one hour. There are no restrictions for this procedure.    Follow-Up: Your physician recommends that you schedule a follow-up appointment in: 3 MONTHS WITH DR END.   If you need a refill on your cardiac medications before your next appointment, please call your pharmacy.   Echocardiogram An echocardiogram, or echocardiography, uses sound waves (ultrasound) to produce an image of your heart. The echocardiogram is simple, painless, obtained within a short period of time, and offers valuable information to your health care provider. The images from an echocardiogram can provide information such as:  Evidence of coronary artery disease (CAD).  Heart size.  Heart muscle function.  Heart valve function.  Aneurysm detection.  Evidence of a past heart attack.  Fluid buildup around the heart.  Heart muscle thickening.  Assess heart valve function.  Tell a health care provider about:  Any allergies you have.  All medicines you are taking, including vitamins, herbs, eye drops, creams, and over-the-counter medicines.  Any problems you or family members have had with anesthetic medicines.  Any blood disorders you have.  Any surgeries you have had.  Any medical conditions you have.  Whether you are pregnant or may be pregnant. What happens before the procedure? No special preparation is needed. Eat and drink normally. What happens during the  procedure?  In order to produce an image of your heart, gel will be applied to your chest and a wand-like tool (transducer) will be moved over your chest. The gel will help transmit the sound waves from the transducer. The sound waves will harmlessly bounce off your heart to allow the heart images to be captured in real-time motion. These images will then be recorded.  You may need an IV to receive a medicine that improves the quality of the pictures. What happens after the procedure? You may return to your normal schedule including diet, activities, and medicines, unless your health care provider tells you otherwise. This information is not intended to replace advice given to you by your health care provider. Make sure you discuss any questions you have with your health care provider. Document Released: 09/27/2000 Document Revised: 05/18/2016 Document Reviewed: 06/07/2013 Elsevier Interactive Patient Education  2017 [employer]    

## 2017-04-11 ENCOUNTER — Other Ambulatory Visit: Payer: Self-pay | Admitting: Internal Medicine

## 2017-04-11 ENCOUNTER — Ambulatory Visit (INDEPENDENT_AMBULATORY_CARE_PROVIDER_SITE_OTHER): Payer: 59

## 2017-04-11 ENCOUNTER — Other Ambulatory Visit: Payer: Self-pay

## 2017-04-11 DIAGNOSIS — R0600 Dyspnea, unspecified: Secondary | ICD-10-CM | POA: Diagnosis not present

## 2017-04-11 DIAGNOSIS — R001 Bradycardia, unspecified: Secondary | ICD-10-CM

## 2017-04-11 DIAGNOSIS — R0609 Other forms of dyspnea: Principal | ICD-10-CM

## 2017-04-17 ENCOUNTER — Telehealth: Payer: Self-pay

## 2017-04-17 NOTE — Telephone Encounter (Signed)
Patient requesting refill of Breo Inhaler to [facility].

## 2017-04-17 NOTE — Telephone Encounter (Signed)
Left voice message of both numbers for patient to return call to schedule appointment

## 2017-04-17 NOTE — Telephone Encounter (Signed)
Please schedule patient an appt for medication. Thanks

## 2017-05-28 DIAGNOSIS — H5203 Hypermetropia, bilateral: Secondary | ICD-10-CM | POA: Diagnosis not present

## 2017-05-30 ENCOUNTER — Ambulatory Visit: Payer: 59 | Admitting: Family Medicine

## 2017-06-02 ENCOUNTER — Encounter: Payer: Self-pay | Admitting: Family Medicine

## 2017-06-02 ENCOUNTER — Ambulatory Visit (INDEPENDENT_AMBULATORY_CARE_PROVIDER_SITE_OTHER): Payer: 59 | Admitting: Family Medicine

## 2017-06-02 VITALS — BP 108/66 | HR 70 | Temp 98.1°F | Resp 16 | Ht 73.0 in | Wt 169.8 lb

## 2017-06-02 DIAGNOSIS — E559 Vitamin D deficiency, unspecified: Secondary | ICD-10-CM

## 2017-06-02 DIAGNOSIS — J309 Allergic rhinitis, unspecified: Secondary | ICD-10-CM

## 2017-06-02 DIAGNOSIS — K219 Gastro-esophageal reflux disease without esophagitis: Secondary | ICD-10-CM

## 2017-06-02 DIAGNOSIS — R490 Dysphonia: Secondary | ICD-10-CM | POA: Diagnosis not present

## 2017-06-02 DIAGNOSIS — J4540 Moderate persistent asthma, uncomplicated: Secondary | ICD-10-CM | POA: Diagnosis not present

## 2017-06-02 MED ORDER — OMEPRAZOLE 20 MG PO CPDR: 20.0000 mg | DELAYED_RELEASE_CAPSULE | Freq: Two times a day (BID) | ORAL | 1 refills | Status: DC

## 2017-06-02 MED ORDER — MONTELUKAST SODIUM 10 MG PO TABS: 10.0000 mg | ORAL_TABLET | Freq: Every day | ORAL | 1 refills | Status: DC

## 2017-06-02 MED ORDER — MOMETASONE FUROATE 50 MCG/ACT NA SUSP: 2.0000 | Freq: Every day | NASAL | 3 refills | Status: DC

## 2017-06-02 NOTE — Progress Notes (Signed)
Name: Oluwadamilare Tobler.   MRN: [id number]    DOB: 11-11-1965   Date:06/02/2017       Progress Note  Subjective  Chief Complaint  Chief Complaint  Patient presents with  . Follow-up    patient is here for his 3 month f/u  . vitamin d deficiency    patient is here after taking the Vitamin D supplement  . Asthma    patient stated that he has been using his inhaler more frequently (BID), especially while at work  . Allergic Rhinitis     itchy ears, but everything is ok. patient stated is highly allergic to the berries on a tree in his backyard.  . Gastroesophageal Reflux    patient stated he has not had any problems, but use medication as needed    Asthma  There is no chest tightness, cough, difficulty breathing, hemoptysis, shortness of breath or sputum production. This is a chronic problem. The problem has been unchanged. Pertinent negatives include no heartburn. His symptoms are alleviated by beta-agonist, steroid inhaler and leukotriene antagonist. His past medical history is significant for asthma.  Gastroesophageal Reflux  He reports no abdominal pain, no belching, no coughing, no heartburn or no nausea. This is a chronic problem. The problem has been unchanged. The symptoms are aggravated by certain foods (spicy foods). He has tried a PPI for the symptoms. The treatment provided significant relief. Past procedures do not include an EGD.   Patient would also like a referral to ENT to evaluate for possible hoarseness in his voice and to rule out vocal cord polyp, apparently has family history of vocal cord polyps.   Past Medical History:  Diagnosis Date  . Allergic rhinitis   . Allergy   . Asthma   . Decreased libido   . History of pleurisy     Past Surgical History:  Procedure Laterality Date  . NO PAST SURGERIES      Family History  Problem Relation Age of Onset  . Asthma Mother   . Lung cancer Mother   . Kidney disease Mother   . Heart disease Neg Hx     Social  History   Social History  . Marital status: Married    Spouse name: N/A  . Number of children: N/A  . Years of education: N/A   Occupational History  . [employer]   Social History Main Topics  . Smoking status: Former Smoker    Packs/day: 1.00    Years: 8.00    Types: Cigarettes    Quit date: 2000  . Smokeless tobacco: Never Used  . Alcohol use 0.0 oz/week     Comment: occassinal beer (once a month); used to drink heavily but not for >3 years  . Drug use: Yes    Frequency: 1.0 time per week    Types: Marijuana     Comment: occasional marijuana use  . Sexual activity: Yes    Partners: Female   Other Topics Concern  . Not on file   Social History Narrative  . No narrative on file     Current Outpatient Prescriptions:  .  albuterol (PROVENTIL HFA;VENTOLIN HFA) 108 (90 Base) MCG/ACT inhaler, Inhale 1-2 puffs into the lungs every 6 (six) hours as needed for wheezing or shortness of breath., Disp: 3 Inhaler, Rfl: 2 .  budesonide-formoterol (SYMBICORT) 80-4.5 MCG/ACT inhaler, Inhale 2 puffs into the lungs 2 (two) times daily., Disp: 3 Inhaler, Rfl: 2 .  fluticasone (FLONASE) 50 MCG/ACT nasal spray,  Place 2 sprays into both nostrils daily., Disp: 16 g, Rfl: 1 .  montelukast (SINGULAIR) 10 MG tablet, Take 1 tablet (10 mg total) by mouth at bedtime., Disp: 90 tablet, Rfl: 1 .  omeprazole (PRILOSEC) 20 MG capsule, Take 1 capsule (20 mg total) by mouth 2 (two) times daily., Disp: 180 capsule, Rfl: 1 .  Vitamin D, Ergocalciferol, (DRISDOL) 50000 units CAPS capsule, Take 1 capsule (50,000 Units total) by mouth once a week. For 12 weeks, Disp: 12 capsule, Rfl: 0  Allergies  Allergen Reactions  . Shellfish Allergy Swelling     Review of Systems  Respiratory: Negative for cough, hemoptysis, sputum production and shortness of breath.   Gastrointestinal: Negative for abdominal pain, heartburn and nausea.     Objective  Vitals:   06/02/17 1137  BP: 108/66  Pulse: 70   Resp: 16  Temp: 98.1 F (36.7 C)  TempSrc: Oral  SpO2: 97%  Weight: 169 lb 12.8 oz (77 kg)  Height: 6\' 1"  (1.854 m)    Physical Exam  Constitutional: He is oriented to person, place, and time and well-developed, well-nourished, and in no distress.  HENT:  Head: Normocephalic and atraumatic.  Mouth/Throat: Oropharynx is clear and moist.  Nasal turbinate hypertrophy, mucosal inflammation.   Cardiovascular: Normal rate, regular rhythm and normal heart sounds.   No murmur heard. Pulmonary/Chest: Effort normal and breath sounds normal. He has no wheezes.  Abdominal: Soft. Bowel sounds are normal. There is no tenderness.  Neurological: He is alert and oriented to person, place, and time.  Psychiatric: Mood, memory, affect and judgment normal.  Nursing note and vitals reviewed.      Assessment & Plan  1. Gastroesophageal reflux disease, esophagitis presence not specified  - omeprazole (PRILOSEC) 20 MG capsule; Take 1 capsule (20 mg total) by mouth 2 (two) times daily.  Dispense: 180 capsule; Refill: 1  2. Moderate persistent chronic asthma without complication  - montelukast (SINGULAIR) 10 MG tablet; Take 1 tablet (10 mg total) by mouth at bedtime.  Dispense: 90 tablet; Refill: 1  3. Allergic rhinitis, unspecified seasonality, unspecified trigger  - mometasone (NASONEX) 50 MCG/ACT nasal spray; Place 2 sprays into the nose daily.  Dispense: 17 g; Refill: 3  4. Vitamin D deficiency  - VITAMIN D 25 Hydroxy (Vit-D Deficiency, Fractures)  5. Hoarseness of voice  - Ambulatory referral to ENT   Marck Mcclenny [facility] 06/02/2017 11:59 AM

## 2017-06-03 LAB — VITAMIN D 25 HYDROXY (VIT D DEFICIENCY, FRACTURES): Vit D, 25-Hydroxy: 38 ng/mL (ref 30–100)

## 2017-06-04 ENCOUNTER — Ambulatory Visit: Payer: 59 | Admitting: Internal Medicine

## 2017-06-24 ENCOUNTER — Encounter: Payer: Self-pay | Admitting: Nurse Practitioner

## 2017-06-24 ENCOUNTER — Ambulatory Visit (INDEPENDENT_AMBULATORY_CARE_PROVIDER_SITE_OTHER): Payer: 59 | Admitting: Nurse Practitioner

## 2017-06-24 VITALS — BP 112/74 | HR 50 | Ht 73.0 in | Wt 170.0 lb

## 2017-06-24 DIAGNOSIS — R001 Bradycardia, unspecified: Secondary | ICD-10-CM

## 2017-06-24 NOTE — Progress Notes (Signed)
Office Visit    Patient Name: Douglas Abbott. Date of Encounter: 06/24/2017  Primary Care Provider:  Roselee Nova, MD Primary Cardiologist:  Andree Coss, MD   Chief Complaint    51 year old male with a history of sinus bradycardia presents for follow-up.  Past Medical History    Past Medical History:  Diagnosis Date  . Allergic rhinitis   . Allergy   . Asthma   . Decreased libido   . History of pleurisy   . Sinus bradycardia    a. 03/2017 Echo: Ef 50-55%.   Past Surgical History:  Procedure Laterality Date  . NO PAST SURGERIES      Allergies  Allergies  Allergen Reactions  . Shellfish Allergy Swelling    History of Present Illness    51 year old male with a history of asthma and multiple food/environmental allergies who was noted earlier this year to have marked sinus bradycardia on a routine physical examination, with heart rates in the 30s. He was evaluated by Dr. Saunders Revel in May and subsequently underwent echocardiography, which showed normal LV function.  Since his last visit, he has continued to do well. He is very active at work, saying that he is walking all day and averages about 30,000 steps per day. He has no history of chest pain, dyspnea on exertion, or syncope. Further, he denies palpitations, PND, orthopnea, dizziness, edema, or early satiety.  Home Medications    Prior to Admission medications   Medication Sig Start Date End Date Taking? Authorizing Provider  albuterol (PROVENTIL HFA;VENTOLIN HFA) 108 (90 Base) MCG/ACT inhaler Inhale 1-2 puffs into the lungs every 6 (six) hours as needed for wheezing or shortness of breath. 01/02/17  Yes Roselee Nova, MD  budesonide-formoterol (SYMBICORT) 80-4.5 MCG/ACT inhaler Inhale 2 puffs into the lungs 2 (two) times daily. 01/02/17  Yes Keith Rake Asad A, MD  mometasone (NASONEX) 50 MCG/ACT nasal spray Place 2 sprays into the nose daily. 06/02/17  Yes Rochel Brome A, MD  montelukast (SINGULAIR) 10 MG  tablet Take 1 tablet (10 mg total) by mouth at bedtime. 06/02/17  Yes Roselee Nova, MD  omeprazole (PRILOSEC) 20 MG capsule Take 1 capsule (20 mg total) by mouth 2 (two) times daily. 06/02/17  Yes Roselee Nova, MD  Vitamin D, Ergocalciferol, (DRISDOL) 50000 units CAPS capsule Take 1 capsule (50,000 Units total) by mouth once a week. For 12 weeks 02/28/17  Yes Roselee Nova, MD    Review of Systems    He continues to do well.  He denies chest pain, palpitations, dyspnea, pnd, orthopnea, n, v, dizziness, syncope, edema, weight gain, or early satiety.  All other systems reviewed and are otherwise negative except as noted above.  Physical Exam    VS:  BP 112/74 (BP Location: Left Arm, Patient Position: Sitting, Cuff Size: Normal)   Pulse (!) 50   Ht 6\' 1"  (1.854 m)   Wt 170 lb (77.1 kg)   SpO2 95%   BMI 22.43 kg/m  , BMI Body mass index is 22.43 kg/m. GEN: Well nourished, well developed, in no acute distress.  HEENT: normal.  Neck: Supple, no JVD, carotid bruits, or masses. Cardiac: RRR, no murmurs, rubs, or gallops. No clubbing, cyanosis, edema.  Radials/DP/PT 2+ and equal bilaterally.  Respiratory:  Respirations regular and unlabored, clear to auscultation bilaterally. GI: Soft, nontender, nondistended, BS + x 4. MS: no deformity or atrophy. Skin: warm and dry, no rash.  Neuro:  Strength and sensation are intact. Psych: Normal affect.  Accessory Clinical Findings    2D Echocardiogram 6.29.2018  Study Conclusions   - Left ventricle: The cavity size was normal. Wall thickness was   normal. Systolic function was normal. The estimated ejection   fraction was in the range of 50% to 55%. Images were inadequate   for LV wall motion assessment. Left ventricular diastolic   function parameters were normal. - Pulmonary arteries: Systolic pressure could not be accurately   estimated. _____________  Lab Results  Component Value Date   TSH 0.80 02/27/2017    Lab Results    Component Value Date   WBC 4.2 02/27/2017   HGB 13.8 02/27/2017   HCT 42.1 02/27/2017   MCV 89.0 02/27/2017   PLT 294 02/27/2017   Lab Results  Component Value Date   CREATININE 1.04 02/27/2017   BUN 15 02/27/2017   NA 140 02/27/2017   K 4.4 02/27/2017   CL 104 02/27/2017   CO2 27 02/27/2017   May 23 ECG reviewed: Regular sinus rhythm, 63, no acute ST or T changes. Normal PR, QRS, and QT intervals.  Assessment & Plan    1.  Asymptomatic sinus bradycardia: Patient with a history of bradycardia with recent findings of rates into the 30s. He is asymptomatic and has excellent exercise tolerance at home. Though he previously reported some dyspnea on exertion, he denies this today. Prior ECG shows no evidence of heart block or conduction delay. Echo showed normal LV function. No further cardiac workup required. Follow-up as needed.  2. Marijuana use: Cessation advised.  3. Disposition: Follow-up as needed.  Murray Hodgkins, NP 06/24/2017, 8:19 AM

## 2017-06-24 NOTE — Patient Instructions (Signed)
Medication Instructions:  Please continue your current medications  Labwork: None  Testing/Procedures: None  Follow-Up: As needed with Dr. Saunders Revel Call or return to clinic prn if these symptoms worsen or fail to improve as anticipated.  If you need a refill on your cardiac medications before your next appointment, please call your pharmacy.

## 2017-07-29 DIAGNOSIS — Z23 Encounter for immunization: Secondary | ICD-10-CM | POA: Diagnosis not present

## 2017-09-03 ENCOUNTER — Ambulatory Visit: Payer: 59 | Admitting: Family Medicine

## 2017-09-09 ENCOUNTER — Ambulatory Visit: Payer: 59 | Admitting: Family Medicine

## 2017-09-16 ENCOUNTER — Encounter: Payer: Self-pay | Admitting: Family Medicine

## 2017-09-16 ENCOUNTER — Ambulatory Visit: Payer: 59 | Admitting: Family Medicine

## 2017-09-16 DIAGNOSIS — K219 Gastro-esophageal reflux disease without esophagitis: Secondary | ICD-10-CM | POA: Diagnosis not present

## 2017-09-16 DIAGNOSIS — J309 Allergic rhinitis, unspecified: Secondary | ICD-10-CM

## 2017-09-16 DIAGNOSIS — G8929 Other chronic pain: Secondary | ICD-10-CM

## 2017-09-16 DIAGNOSIS — M25511 Pain in right shoulder: Secondary | ICD-10-CM | POA: Insufficient documentation

## 2017-09-16 DIAGNOSIS — J4540 Moderate persistent asthma, uncomplicated: Secondary | ICD-10-CM

## 2017-09-16 MED ORDER — MONTELUKAST SODIUM 10 MG PO TABS: 10.0000 mg | ORAL_TABLET | Freq: Every day | ORAL | 1 refills | Status: DC

## 2017-09-16 MED ORDER — OMEPRAZOLE 20 MG PO CPDR: 20.0000 mg | DELAYED_RELEASE_CAPSULE | Freq: Two times a day (BID) | ORAL | 1 refills | Status: DC

## 2017-09-16 MED ORDER — BUDESONIDE-FORMOTEROL FUMARATE 80-4.5 MCG/ACT IN AERO: 2.0000 | INHALATION_SPRAY | Freq: Two times a day (BID) | RESPIRATORY_TRACT | 2 refills | Status: DC

## 2017-09-16 MED ORDER — MOMETASONE FUROATE 50 MCG/ACT NA SUSP: 2.0000 | Freq: Every day | NASAL | 3 refills | Status: DC

## 2017-09-16 MED ORDER — ALBUTEROL SULFATE HFA 108 (90 BASE) MCG/ACT IN AERS: 1.0000 | INHALATION_SPRAY | Freq: Four times a day (QID) | RESPIRATORY_TRACT | 2 refills | Status: DC | PRN

## 2017-09-16 NOTE — Progress Notes (Signed)
Name: Douglas Abbott.   MRN: [id number]    DOB: 06/24/1966   Date:09/16/2017       Progress Note  Subjective  Chief Complaint  Chief Complaint  Patient presents with  . Shoulder Pain    right shoulder in pain when he lay on his left side. Pain is constant most of the time when laying down. During the day no pain. Going on for a year   . Asthma    3 month f/u  . Gastroesophageal Reflux    3 month f/u    Shoulder Pain   The pain is present in the right shoulder. This is a recurrent problem. The current episode started more than 1 month ago (pain for almost 1 year.). There has been no history of extremity trauma. The problem occurs daily. The problem has been unchanged. The quality of the pain is described as sharp. The pain is at a severity of 6/10. The pain is moderate. Associated symptoms include a limited range of motion and stiffness. The symptoms are aggravated by lying down. He has tried NSAIDS for the symptoms. The treatment provided moderate relief. There is no history of osteoarthritis.  Asthma  There is no chest tightness, frequent throat clearing, sputum production or wheezing. This is a chronic problem. Pertinent negatives include no chest pain, dyspnea on exertion or sore throat. His symptoms are alleviated by beta-agonist, steroid inhaler and leukotriene antagonist. His past medical history is significant for asthma.  Gastroesophageal Reflux  He reports no abdominal pain, no chest pain, no nausea, no sore throat or no wheezing. This is a chronic problem. The symptoms are aggravated by certain foods (spicy foods. ). He has tried a PPI for the symptoms. Past procedures do not include an EGD.      Past Medical History:  Diagnosis Date  . Allergic rhinitis   . Allergy   . Asthma   . Decreased libido   . History of pleurisy   . Sinus bradycardia    a. 03/2017 Echo: Ef 50-55%.    Past Surgical History:  Procedure Laterality Date  . NO PAST SURGERIES      Family  History  Problem Relation Age of Onset  . Asthma Mother   . Lung cancer Mother   . Kidney disease Mother   . Heart disease Neg Hx     Social History   Socioeconomic History  . Marital status: Married    Spouse name: Not on file  . Number of children: Not on file  . Years of education: Not on file  . Highest education level: Not on file  Social Needs  . Financial resource strain: Not on file  . Food insecurity - worry: Not on file  . Food insecurity - inability: Not on file  . Transportation needs - medical: Not on file  . Transportation needs - non-medical: Not on file  Occupational History  . Occupation: Scientist, forensic: [employer]  Tobacco Use  . Smoking status: Former Smoker    Packs/day: 1.00    Years: 8.00    Pack years: 8.00    Types: Cigarettes    Last attempt to quit: 2000    Years since quitting: 18.9  . Smokeless tobacco: Never Used  Substance and Sexual Activity  . Alcohol use: No    Alcohol/week: 0.0 oz    Comment: occass[place] beer (once a month); used to drink heavily but not for >3 years  . Drug use: Yes  Frequency: 1.0 times per week    Types: Marijuana    Comment: occasional marijuana use  . Sexual activity: Yes    Partners: Female  Other Topics Concern  . Not on file  Social History Narrative  . Not on file     Current Outpatient Medications:  .  albuterol (PROVENTIL HFA;VENTOLIN HFA) 108 (90 Base) MCG/ACT inhaler, Inhale 1-2 puffs into the lungs every 6 (six) hours as needed for wheezing or shortness of breath., Disp: 3 Inhaler, Rfl: 2 .  budesonide-formoterol (SYMBICORT) 80-4.5 MCG/ACT inhaler, Inhale 2 puffs into the lungs 2 (two) times daily., Disp: 3 Inhaler, Rfl: 2 .  mometasone (NASONEX) 50 MCG/ACT nasal spray, Place 2 sprays into the nose daily., Disp: 17 g, Rfl: 3 .  montelukast (SINGULAIR) 10 MG tablet, Take 1 tablet (10 mg total) by mouth at bedtime., Disp: 90 tablet, Rfl: 1 .  omeprazole (PRILOSEC) 20 MG capsule, Take 1 capsule  (20 mg total) by mouth 2 (two) times daily., Disp: 180 capsule, Rfl: 1 .  Vitamin D, Ergocalciferol, (DRISDOL) 50000 units CAPS capsule, Take 1 capsule (50,000 Units total) by mouth once a week. For 12 weeks (Patient not taking: Reported on 09/16/2017), Disp: 12 capsule, Rfl: 0  Allergies  Allergen Reactions  . Shellfish Allergy Swelling     Review of Systems  HENT: Negative for sore throat.   Respiratory: Negative for sputum production and wheezing.   Cardiovascular: Negative for chest pain and dyspnea on exertion.  Gastrointestinal: Negative for abdominal pain and nausea.  Musculoskeletal: Positive for stiffness.      Objective  Vitals:   09/16/17 1456  BP: 108/76  Pulse: 64  Resp: 18  Temp: 98 F (36.7 C)  TempSrc: Oral  SpO2: 99%  Weight: 170 lb 9.6 oz (77.4 kg)    Physical Exam  Constitutional: He is oriented to person, place, and time and well-developed, well-nourished, and in no distress.  HENT:  Head: Normocephalic and atraumatic.  Cardiovascular: Normal rate, regular rhythm and normal heart sounds.  No murmur heard. Pulmonary/Chest: Effort normal and breath sounds normal. He has no wheezes.  Musculoskeletal:       Right shoulder: He exhibits tenderness. He exhibits no swelling, no effusion and no pain.       Arms: Pain in right shoulder while raising the right arm past midline.  Neurological: He is alert and oriented to person, place, and time.  Psychiatric: Mood, memory, affect and judgment normal.  Nursing note and vitals reviewed.      Assessment & Plan  1. Gastroesophageal reflux disease, esophagitis presence not specified Symptoms of reflux are stable on PPI and also with changing his diet to avoid spicy foods - omeprazole (PRILOSEC) 20 MG capsule; Take 1 capsule (20 mg total) by mouth 2 (two) times daily.  Dispense: 180 capsule; Refill: 1  2. Moderate persistent chronic asthma without complication Stable, continue on inhaled steroid, albuterol  as needed - albuterol (PROVENTIL HFA;VENTOLIN HFA) 108 (90 Base) MCG/ACT inhaler; Inhale 1-2 puffs into the lungs every 6 (six) hours as needed for wheezing or shortness of breath.  Dispense: 3 Inhaler; Refill: 2 - budesonide-formoterol (SYMBICORT) 80-4.5 MCG/ACT inhaler; Inhale 2 puffs into the lungs 2 (two) times daily.  Dispense: 3 Inhaler; Refill: 2 - montelukast (SINGULAIR) 10 MG tablet; Take 1 tablet (10 mg total) by mouth at bedtime.  Dispense: 90 tablet; Refill: 1  3. Allergic rhinitis, unspecified seasonality, unspecified trigger  - mometasone (NASONEX) 50 MCG/ACT nasal spray; Place 2 sprays  into the nose daily.  Dispense: 17 g; Refill: 3  4. Chronic right shoulder pain Suspect rotator cuff pathology versus shoulder sprain from overuse, obtain x-rays - DG Shoulder Right; Future   Douglas Abbott [facility] 09/16/2017 3:06 PM

## 2018-01-12 ENCOUNTER — Ambulatory Visit: Payer: 59 | Admitting: Family Medicine

## 2018-03-23 ENCOUNTER — Encounter: Payer: Self-pay | Admitting: Nurse Practitioner

## 2018-03-27 ENCOUNTER — Encounter: Payer: Self-pay | Admitting: Family Medicine

## 2018-03-27 ENCOUNTER — Ambulatory Visit (INDEPENDENT_AMBULATORY_CARE_PROVIDER_SITE_OTHER): Payer: 59 | Admitting: Family Medicine

## 2018-03-27 ENCOUNTER — Other Ambulatory Visit: Payer: Self-pay

## 2018-03-27 VITALS — BP 122/72 | HR 73 | Temp 97.9°F | Resp 14 | Ht 73.0 in | Wt 167.4 lb

## 2018-03-27 DIAGNOSIS — K625 Hemorrhage of anus and rectum: Secondary | ICD-10-CM

## 2018-03-27 DIAGNOSIS — Z23 Encounter for immunization: Secondary | ICD-10-CM | POA: Diagnosis not present

## 2018-03-27 DIAGNOSIS — J4540 Moderate persistent asthma, uncomplicated: Secondary | ICD-10-CM

## 2018-03-27 DIAGNOSIS — K921 Melena: Secondary | ICD-10-CM | POA: Diagnosis not present

## 2018-03-27 DIAGNOSIS — K219 Gastro-esophageal reflux disease without esophagitis: Secondary | ICD-10-CM

## 2018-03-27 DIAGNOSIS — Z1211 Encounter for screening for malignant neoplasm of colon: Secondary | ICD-10-CM

## 2018-03-27 DIAGNOSIS — R311 Benign essential microscopic hematuria: Secondary | ICD-10-CM

## 2018-03-27 DIAGNOSIS — J309 Allergic rhinitis, unspecified: Secondary | ICD-10-CM

## 2018-03-27 HISTORY — DX: Hemorrhage of anus and rectum: K62.5

## 2018-03-27 MED ORDER — ALBUTEROL SULFATE HFA 108 (90 BASE) MCG/ACT IN AERS: 1.0000 | INHALATION_SPRAY | Freq: Four times a day (QID) | RESPIRATORY_TRACT | 3 refills | Status: DC | PRN

## 2018-03-27 MED ORDER — BUDESONIDE-FORMOTEROL FUMARATE 160-4.5 MCG/ACT IN AERO: 2.0000 | INHALATION_SPRAY | Freq: Two times a day (BID) | RESPIRATORY_TRACT | 3 refills | Status: DC

## 2018-03-27 MED ORDER — TETANUS-DIPHTH-ACELL PERTUSSIS 5-2.5-18.5 LF-MCG/0.5 IM SUSP
0.5000 mL | Freq: Once | INTRAMUSCULAR | Status: AC
  Administered 2018-03-27: 0.5 mL via INTRAMUSCULAR

## 2018-03-27 MED ORDER — MONTELUKAST SODIUM 10 MG PO TABS: 10.0000 mg | ORAL_TABLET | Freq: Every day | ORAL | 3 refills | Status: DC

## 2018-03-27 MED ORDER — MOMETASONE FUROATE 50 MCG/ACT NA SUSP: 2.0000 | Freq: Every day | NASAL | 3 refills | Status: DC

## 2018-03-27 NOTE — Assessment & Plan Note (Addendum)
Due for PPSV-23; will get booster after turning 66 per current ACIP guidelines; due for spirometry

## 2018-03-27 NOTE — Progress Notes (Signed)
BP 122/72   Pulse 73   Temp 97.9 F (36.6 C) (Oral)   Resp 14   Ht 6\' 1"  (1.854 m)   Wt 167 lb 6.4 oz (75.9 kg)   SpO2 97%   BMI 22.09 kg/m    Subjective:    Patient ID: Douglas Abbott., male    DOB: January 09, 1966, 52 y.o.   MRN: [id number]  HPI: Douglas Abbott. is a 52 y.o. male  Chief Complaint  Patient presents with  . Follow-up  . Asthma    FLMA due to work    HPI Patient is new to me; previous provider left our practice  He has asthma and has FMLA paperwork to do for work Works at [employer] and always smokey and dusty; Sales executive; when he first started working, there was a jam up and there was smoke; that kind of started it back; got with Dr. Rutherford Nail and he went to a lot of doctors to get situated; saw lung specilalist; went to allergy testing; allergic to everything; FMLA has run out and he called and sent paperwork, on his e-mail He had asthma as a child He has a few flares; if he sees smoke in his building, he knows to get away; he had a bad flare-up after a jam up; got through it from Thursday to Sunday; had to use rescue inhaler 4x a day Currently, using SABA sporadically, maybe once a day  He has a hx of vitamin D deficiency; it was 55 last May and came up to 65 in August after treatment  HM reviewed; needs PPSV-23 and tetanus and pertussis  Quit drinking for the most part except on special occasions; went on a cruise, then stool was bloody for two days in May; scared him; he has seen it before, talked to Dr. Rutherford Nail about it and had a few colonoscopy; was told he had a hemorrhoid; his wife is a CNA and she said don't worry; she thinks he needs another colonoscopy; uncle had an ulcer and every time he drank he bled; he has GERD and uses PPI  Mother and patient always have blood in the urine; had full work-up; nothing ever found  Depression screen [facility] 2/9 03/27/2018 09/16/2017 06/02/2017 02/27/2017 07/03/2016  Decreased Interest 0 0 0 0 0  Down, Depressed,  Hopeless 0 0 0 0 0  PHQ - 2 Score 0 0 0 0 0    Relevant past medical, surgical, family and social history reviewed Past Medical History:  Diagnosis Date  . Allergic rhinitis   . Allergy   . Asthma   . Decreased libido   . History of pleurisy   . Sinus bradycardia    a. 03/2017 Echo: Ef 50-55%.   Past Surgical History:  Procedure Laterality Date  . NO PAST SURGERIES     Family History  Problem Relation Age of Onset  . Asthma Mother   . Lung cancer Mother   . Kidney disease Mother   . Heart disease Neg Hx    Social History   Tobacco Use  . Smoking status: Former Smoker    Packs/day: 1.00    Years: 8.00    Pack years: 8.00    Types: Cigarettes    Last attempt to quit: 2000    Years since quitting: 19.4  . Smokeless tobacco: Never Used  Substance Use Topics  . Alcohol use: No    Alcohol/week: 0.0 oz    Frequency: Never  . Drug use: Yes  Frequency: 1.0 times per week    Types: Marijuana    Comment: occasional marijuana use    Interim medical history since last visit reviewed. Allergies and medications reviewed  Review of Systems Per HPI unless specifically indicated above     Objective:    BP 122/72   Pulse 73   Temp 97.9 F (36.6 C) (Oral)   Resp 14   Ht 6\' 1"  (1.854 m)   Wt 167 lb 6.4 oz (75.9 kg)   SpO2 97%   BMI 22.09 kg/m   Wt Readings from Last 3 Encounters:  03/27/18 167 lb 6.4 oz (75.9 kg)  09/16/17 170 lb 9.6 oz (77.4 kg)  06/24/17 170 lb (77.1 kg)    Physical Exam  Constitutional: He appears well-developed and well-nourished. No distress.  HENT:  Head: Normocephalic and atraumatic.  Eyes: EOM are normal. No scleral icterus.  Neck: No thyromegaly present.  Cardiovascular: Normal rate and regular rhythm.  Pulmonary/Chest: Effort normal and breath sounds normal.  Abdominal: Soft. Bowel sounds are normal. He exhibits no distension. There is no tenderness.  Musculoskeletal: He exhibits no edema.  Neurological: He is alert.  Skin: Skin  is warm and dry. No pallor.  Psychiatric: He has a normal mood and affect. His behavior is normal. Judgment and thought content normal.    Results for orders placed or performed in visit on 06/02/17  VITAMIN D 25 Hydroxy (Vit-D Deficiency, Fractures)  Result Value Ref Range   Vit D, 25-Hydroxy 38 30 - 100 ng/mL      Assessment & Plan:   Problem List Items Addressed This Visit      Respiratory   Asthma, chronic - Primary (Chronic)    Due for PPSV-23; will get booster after turning 66 per current ACIP guidelines; due for spirometry      Relevant Medications   budesonide-formoterol (SYMBICORT) 160-4.5 MCG/ACT inhaler   montelukast (SINGULAIR) 10 MG tablet   albuterol (PROVENTIL HFA;VENTOLIN HFA) 108 (90 Base) MCG/ACT inhaler   Other Relevant Orders   Pulmonary function test   Allergic rhinitis (Chronic)   Relevant Medications   mometasone (NASONEX) 50 MCG/ACT nasal spray     Digestive   GERD (gastroesophageal reflux disease) (Chronic)    On PPI      BRBPR (bright red blood per rectum)    Refer back for colonoscopy, or at least eval by GI       Relevant Orders   Ambulatory referral to Gastroenterology     Other   Need for pneumococcal vaccination    Need for PPSV-23      Blood in the urine    Hereditary; already worked up       Other Visit Diagnoses    Blood in stool       Need for Tdap vaccination       Relevant Medications   Tdap (BOOSTRIX) injection 0.5 mL (Completed)   Screen for colon cancer           Follow up plan: No follow-ups on file.  An after-visit summary was printed and given to the patient at [facility].  Please see the patient instructions which may contain other information and recommendations beyond what is mentioned above in the assessment and plan.  Meds ordered this encounter  Medications  . budesonide-formoterol (SYMBICORT) 160-4.5 MCG/ACT inhaler    Sig: Inhale 2 puffs into the lungs 2 (two) times daily.    Dispense:  3 Inhaler     Refill:  3  We're increasing the strength  . montelukast (SINGULAIR) 10 MG tablet    Sig: Take 1 tablet (10 mg total) by mouth at bedtime.    Dispense:  90 tablet    Refill:  3  . mometasone (NASONEX) 50 MCG/ACT nasal spray    Sig: Place 2 sprays into the nose daily.    Dispense:  51 g    Refill:  3  . albuterol (PROVENTIL HFA;VENTOLIN HFA) 108 (90 Base) MCG/ACT inhaler    Sig: Inhale 1-2 puffs into the lungs every 6 (six) hours as needed for wheezing or shortness of breath.    Dispense:  1 Inhaler    Refill:  3  . Tdap (BOOSTRIX) injection 0.5 mL    Orders Placed This Encounter  Procedures  . Pneumococcal polysaccharide vaccine 23-valent greater than or equal to 2yo subcutaneous/IM  . Ambulatory referral to Gastroenterology  . Pulmonary function test

## 2018-03-27 NOTE — Assessment & Plan Note (Signed)
Refer back for colonoscopy, or at least eval by GI

## 2018-03-27 NOTE — Assessment & Plan Note (Signed)
On PPI

## 2018-03-27 NOTE — Patient Instructions (Addendum)
You have received the Pneumovax vaccine (PPSV-23) and you will not need another booster of this for the rest of your life per current ACIP guidelines I've sent refills to your pharmacy Avoid triggers We'll have you see the gastroenterologist

## 2018-03-27 NOTE — Assessment & Plan Note (Signed)
Need for PPSV-23

## 2018-03-27 NOTE — Assessment & Plan Note (Signed)
Hereditary; already worked up

## 2018-03-30 ENCOUNTER — Telehealth: Payer: Self-pay

## 2018-03-30 LAB — PULMONARY FUNCTION TEST

## 2018-03-30 NOTE — Telephone Encounter (Signed)
Left pt detailed voicemail to call back regarding questions to [facility] paperwork

## 2018-03-31 ENCOUNTER — Telehealth: Payer: Self-pay | Admitting: Family Medicine

## 2018-03-31 NOTE — Telephone Encounter (Signed)
Copied from [place] [id number]. Topic: Quick Communication - Office Called Patient >> Mar 30, 2018  4:30 PM Cathrine Muster, CMA wrote: Reason for CRM: Called left voicemail to call back regarding questions for FLMA paperwork >> Mar 31, 2018 10:46 AM Cleaster Corin, NT wrote: Pt. Calling back to speak with Roselyn Reef about fmla paperwork [phone]

## 2018-04-08 NOTE — Telephone Encounter (Signed)
Pt states his company still has not received the [facility] paperwork.  Would like to know if this has been done.

## 2018-04-09 NOTE — Telephone Encounter (Signed)
Pt notified it would be filled out and faxed by the end of the day

## 2018-04-15 ENCOUNTER — Other Ambulatory Visit: Payer: Self-pay

## 2018-04-15 ENCOUNTER — Encounter: Payer: Self-pay | Admitting: Gastroenterology

## 2018-04-15 ENCOUNTER — Ambulatory Visit (INDEPENDENT_AMBULATORY_CARE_PROVIDER_SITE_OTHER): Payer: 59 | Admitting: Gastroenterology

## 2018-04-15 VITALS — BP 108/65 | HR 81 | Ht 73.0 in | Wt 171.2 lb

## 2018-04-15 DIAGNOSIS — K625 Hemorrhage of anus and rectum: Secondary | ICD-10-CM

## 2018-04-15 NOTE — Progress Notes (Signed)
Jonathon Bellows MD, MRCP(U.K) [place]  [place], [place] [address]  Main: [phone]  Fax: [phone]   Gastroenterology Consultation  Referring Provider:     Arnetha Courser, MD Primary Care Physician:  Arnetha Courser, MD Primary Gastroenterologist:  Dr. Jonathon Bellows  Reason for Consultation:     Rectal bleeding         HPI:   Douglas Abbott. is a 52 y.o. y/o male referred for consultation & management  by Dr. Sanda Klein, Satira Anis, MD.    He has been referred for Bright red blood per rectum .No recent labs . He says that he has had 3 colonoscopy in the past , last was 3 years back , he was told he has had polyps every time. Was told he may have hemorrhoids.   He says that he stopped drinking 2 years back and when he does drink on special occasional, notices bright blood when he wipes. He said that also has the same issue when he eats spicy foods. Denies any weight loss or change in shape of his tool.    Past Medical History:  Diagnosis Date  . Allergic rhinitis   . Allergy   . Asthma   . Decreased libido   . History of pleurisy   . Sinus bradycardia    a. 03/2017 Echo: Ef 50-55%.    Past Surgical History:  Procedure Laterality Date  . NO PAST SURGERIES      Prior to Admission medications   Medication Sig Start Date End Date Taking? Authorizing Provider  albuterol (PROVENTIL HFA;VENTOLIN HFA) 108 (90 Base) MCG/ACT inhaler Inhale 1-2 puffs into the lungs every 6 (six) hours as needed for wheezing or shortness of breath. 03/27/18   Arnetha Courser, MD  budesonide-formoterol (SYMBICORT) 160-4.5 MCG/ACT inhaler Inhale 2 puffs into the lungs 2 (two) times daily. 03/27/18   Lada, Satira Anis, MD  mometasone (NASONEX) 50 MCG/ACT nasal spray Place 2 sprays into the nose daily. 03/27/18   Lada, Satira Anis, MD  montelukast (SINGULAIR) 10 MG tablet Take 1 tablet (10 mg total) by mouth at bedtime. 03/27/18   Arnetha Courser, MD  omeprazole (PRILOSEC) 20 MG capsule Take  1 capsule (20 mg total) by mouth 2 (two) times daily. 09/16/17   Roselee Nova, MD    Family History  Problem Relation Age of Onset  . Asthma Mother   . Lung cancer Mother   . Kidney disease Mother   . Heart disease Neg Hx      Social History   Tobacco Use  . Smoking status: Former Smoker    Packs/day: 1.00    Years: 8.00    Pack years: 8.00    Types: Cigarettes    Last attempt to quit: 2000    Years since quitting: 19.5  . Smokeless tobacco: Never Used  Substance Use Topics  . Alcohol use: No    Alcohol/week: 0.0 oz    Frequency: Never  . Drug use: Yes    Frequency: 1.0 times per week    Types: Marijuana    Comment: occasional marijuana use    Allergies as of 04/15/2018 - Review Complete 03/27/2018  Allergen Reaction Noted  . Shellfish allergy Swelling 03/04/2017    Review of Systems:    All systems reviewed and negative except where noted in HPI.   Physical Exam:  There were no vitals taken for this visit. No LMP for male patient. Psych:  Alert and cooperative. Normal mood and affect. General:   Alert,  Well-developed, well-nourished, pleasant and cooperative in NAD Head:  Normocephalic and atraumatic. Eyes:  Sclera clear, no icterus.   Conjunctiva pink. Ears:  Normal auditory acuity. Nose:  No deformity, discharge, or lesions. Mouth:  No deformity or lesions,oropharynx pink & moist. Neck:  Supple; no masses or thyromegaly. Lungs:  Respirations even and unlabored.  Clear throughout to auscultation.   No wheezes, crackles, or rhonchi. No acute distress. Heart:  Regular rate and rhythm; no murmurs, clicks, rubs, or gallops. Abdomen:  Normal bowel sounds.  No bruits.  Soft, non-tender and non-distended without masses, hepatosplenomegaly or hernias noted.  No guarding or rebound tenderness.    Neurologic:  Alert and oriented x3;  grossly normal neurologically. Skin:  Intact without significant lesions or rashes. No jaundice. Lymph Nodes:  No significant  cervical adenopathy. Psych:  Alert and cooperative. Normal mood and affect.  Imaging Studies: No results found.  Assessment and Plan:   Douglas Abbott. is a 52 y.o. y/o male has been referred for rectal bleeding .  Plan  1. Colonoscopy  2. High fiber diet  3. IF he has hemorroids then may need banding .   I have discussed alternative options, risks & benefits,  which include, but are not limited to, bleeding, infection, perforation,respiratory complication & drug reaction.  The patient agrees with this plan & written consent will be obtained.    Follow up PRN   Dr Jonathon Bellows MD,MRCP(U.K)

## 2018-04-27 NOTE — Telephone Encounter (Signed)
Patient is calling to check and see if Dr. Sanda Klein has completed his FMLA paperwork after they sent it back to her because it was missing documentation? He needs this completed as soon as possible because he's coming up on actually needing to redo the entire application. Please call patient back.

## 2018-04-27 NOTE — Telephone Encounter (Signed)
Pt notified that we have not received anything back since we last sent off on 6/27?

## 2018-05-01 ENCOUNTER — Telehealth: Payer: Self-pay | Admitting: Gastroenterology

## 2018-05-01 NOTE — Telephone Encounter (Signed)
Patient stated that his colonoscopy was supposed to be on Monday 22 not Tues 23.  His colonoscopy date has been changed with Trish in Endo and referral has been updated.  Wannetta Sender will contact him with time for Monday.  Thanks [employer]

## 2018-05-01 NOTE — Telephone Encounter (Signed)
Pt is calling because his paperwork said his procedure is Monday 7/22 and his scheduled day is Tuesday 7/23 he would like to figure out what happened

## 2018-05-04 ENCOUNTER — Ambulatory Visit
Admission: RE | Admit: 2018-05-04 | Discharge: 2018-05-04 | Disposition: A | Discharge status: Home / Self Care | Payer: 59 | Source: Ambulatory Visit | Attending: Gastroenterology | Admitting: Gastroenterology

## 2018-05-04 ENCOUNTER — Ambulatory Visit: Payer: 59 | Admitting: Certified Registered"

## 2018-05-04 ENCOUNTER — Encounter: Payer: Self-pay | Admitting: *Deleted

## 2018-05-04 ENCOUNTER — Encounter
Admission: RE | Disposition: A | Discharge status: Home / Self Care | Payer: Self-pay | Source: Ambulatory Visit | Attending: Gastroenterology

## 2018-05-04 DIAGNOSIS — K219 Gastro-esophageal reflux disease without esophagitis: Secondary | ICD-10-CM | POA: Insufficient documentation

## 2018-05-04 DIAGNOSIS — K640 First degree hemorrhoids: Secondary | ICD-10-CM | POA: Diagnosis not present

## 2018-05-04 DIAGNOSIS — D125 Benign neoplasm of sigmoid colon: Secondary | ICD-10-CM

## 2018-05-04 DIAGNOSIS — Z87891 Personal history of nicotine dependence: Secondary | ICD-10-CM | POA: Insufficient documentation

## 2018-05-04 DIAGNOSIS — J45909 Unspecified asthma, uncomplicated: Secondary | ICD-10-CM | POA: Diagnosis not present

## 2018-05-04 DIAGNOSIS — K648 Other hemorrhoids: Secondary | ICD-10-CM | POA: Diagnosis not present

## 2018-05-04 DIAGNOSIS — K625 Hemorrhage of anus and rectum: Secondary | ICD-10-CM | POA: Insufficient documentation

## 2018-05-04 DIAGNOSIS — K635 Polyp of colon: Secondary | ICD-10-CM | POA: Insufficient documentation

## 2018-05-04 DIAGNOSIS — Z91013 Allergy to seafood: Secondary | ICD-10-CM | POA: Diagnosis not present

## 2018-05-04 DIAGNOSIS — Z79899 Other long term (current) drug therapy: Secondary | ICD-10-CM | POA: Insufficient documentation

## 2018-05-04 DIAGNOSIS — D120 Benign neoplasm of cecum: Secondary | ICD-10-CM

## 2018-05-04 DIAGNOSIS — D122 Benign neoplasm of ascending colon: Secondary | ICD-10-CM | POA: Insufficient documentation

## 2018-05-04 DIAGNOSIS — K6289 Other specified diseases of anus and rectum: Secondary | ICD-10-CM | POA: Diagnosis not present

## 2018-05-04 DIAGNOSIS — K5289 Other specified noninfective gastroenteritis and colitis: Secondary | ICD-10-CM | POA: Diagnosis not present

## 2018-05-04 HISTORY — PX: COLONOSCOPY WITH PROPOFOL: SHX5780

## 2018-05-04 SURGERY — COLONOSCOPY WITH PROPOFOL
Anesthesia: General

## 2018-05-04 MED ORDER — GLYCOPYRROLATE 0.2 MG/ML IJ SOLN
INTRAMUSCULAR | Status: DC | PRN
  Administered 2018-05-04: 0.1 mg via INTRAVENOUS

## 2018-05-04 MED ORDER — PROPOFOL 10 MG/ML IV BOLUS
INTRAVENOUS | Status: DC | PRN
  Administered 2018-05-04: 100 mg via INTRAVENOUS
  Administered 2018-05-04 (×2): 50 mg via INTRAVENOUS

## 2018-05-04 MED ORDER — LIDOCAINE HCL (CARDIAC) PF 100 MG/5ML IV SOSY
PREFILLED_SYRINGE | INTRAVENOUS | Status: DC | PRN
  Administered 2018-05-04: 50 mg via INTRAVENOUS

## 2018-05-04 MED ORDER — GLYCOPYRROLATE 0.2 MG/ML IJ SOLN
INTRAMUSCULAR | Status: AC
Start: 2018-05-04 — End: ?
  Filled 2018-05-04: qty 1

## 2018-05-04 MED ORDER — SODIUM CHLORIDE 0.9 % IV SOLN
INTRAVENOUS | Status: DC
  Administered 2018-05-04: 1000 mL via INTRAVENOUS

## 2018-05-04 MED ORDER — LIDOCAINE HCL (PF) 2 % IJ SOLN
INTRAMUSCULAR | Status: AC
  Filled 2018-05-04: qty 10

## 2018-05-04 MED ORDER — PROPOFOL 500 MG/50ML IV EMUL
INTRAVENOUS | Status: AC
  Filled 2018-05-04: qty 50

## 2018-05-04 MED ORDER — PROPOFOL 500 MG/50ML IV EMUL
INTRAVENOUS | Status: DC | PRN
  Administered 2018-05-04: 100 ug/kg/min via INTRAVENOUS

## 2018-05-04 NOTE — Transfer of Care (Signed)
Immediate Anesthesia Transfer of Care Note  Patient: Douglas Abbott.  Procedure(s) Performed: COLONOSCOPY WITH PROPOFOL (N/A )  Patient Location: PACU and Endoscopy Unit  Anesthesia Type:General  Level of Consciousness: drowsy and patient cooperative  Airway & Oxygen Therapy: Patient Spontanous Breathing  Post-op Assessment: Report given to RN, Post -op Vital signs reviewed and stable and Patient moving all extremities  Post vital signs: Reviewed and stable  Last Vitals:  Vitals Value Taken Time  BP 96/67 05/04/2018 11:30 AM  Temp 36 C 05/04/2018 11:30 AM  Pulse 48 05/04/2018 11:30 AM  Resp 19 05/04/2018 11:30 AM  SpO2 100 % 05/04/2018 11:30 AM  Vitals shown include unvalidated device data.  Last Pain:  Vitals:   05/04/18 1129  TempSrc: Temporal  PainSc: Asleep         Complications: No apparent anesthesia complications

## 2018-05-04 NOTE — Anesthesia Postprocedure Evaluation (Signed)
Anesthesia Post Note  Patient: Douglas Abbott.  Procedure(s) Performed: COLONOSCOPY WITH PROPOFOL (N/A )  Patient location during evaluation: Endoscopy Anesthesia Type: General Level of consciousness: awake and alert, oriented and patient cooperative Pain management: satisfactory to patient Vital Signs Assessment: post-procedure vital signs reviewed and stable Respiratory status: spontaneous breathing and respiratory function stable Cardiovascular status: blood pressure returned to baseline and stable Postop Assessment: no headache, no backache, patient able to bend at knees, no apparent nausea or vomiting, adequate PO intake and able to ambulate Anesthetic complications: no     Last Vitals:  Vitals:   05/04/18 1129 05/04/18 1130  BP: 96/67 96/67  Pulse: (!) 55 (!) 50  Resp: 19 19  Temp: (!) 36.1 C (!) 36 C  SpO2: 100% 100%    Last Pain:  Vitals:   05/04/18 1129  TempSrc: Temporal  PainSc: Asleep                 Markeita Alicia H Elspeth Blucher

## 2018-05-04 NOTE — Anesthesia Post-op Follow-up Note (Signed)
Anesthesia QCDR form completed.        

## 2018-05-04 NOTE — H&P (Signed)
Douglas Bellows, MD [address] [place], [place], [place], [address] [address], [place], [place], [place], [address] Phone: [phone]  Fax: [phone]  Primary Care Physician:  Arnetha Courser, MD   Pre-Procedure History & Physical: HPI:  Douglas Leaming. is a 52 y.o. male is here for an colonoscopy.   Past Medical History:  Diagnosis Date  . Allergic rhinitis   . Allergy   . Asthma   . Decreased libido   . History of pleurisy   . Sinus bradycardia    a. 03/2017 Echo: Ef 50-55%.    Past Surgical History:  Procedure Laterality Date  . NO PAST SURGERIES      Prior to Admission medications   Medication Sig Start Date End Date Taking? Authorizing Provider  albuterol (PROVENTIL HFA;VENTOLIN HFA) 108 (90 Base) MCG/ACT inhaler Inhale 1-2 puffs into the lungs every 6 (six) hours as needed for wheezing or shortness of breath. 03/27/18  Yes Lada, Satira Anis, MD  budesonide-formoterol (SYMBICORT) 160-4.5 MCG/ACT inhaler Inhale 2 puffs into the lungs 2 (two) times daily. 03/27/18  Yes Lada, Satira Anis, MD  mometasone (NASONEX) 50 MCG/ACT nasal spray Place 2 sprays into the nose daily. 03/27/18  Yes Lada, Satira Anis, MD  montelukast (SINGULAIR) 10 MG tablet Take 1 tablet (10 mg total) by mouth at bedtime. 03/27/18  Yes Lada, Satira Anis, MD  omeprazole (PRILOSEC) 20 MG capsule Take 1 capsule (20 mg total) by mouth 2 (two) times daily. 09/16/17  Yes Roselee Nova, MD    Allergies as of 04/15/2018 - Review Complete 04/15/2018  Allergen Reaction Noted  . Shellfish allergy Swelling 03/04/2017    Family History  Problem Relation Age of Onset  . Asthma Mother   . Lung cancer Mother   . Kidney disease Mother   . Heart disease Neg Hx     Social History   Socioeconomic History  . Marital status: Married    Spouse name: Not on file  . Number of children: Not on file  . Years of education: Not on file  . Highest education level: Not on file  Occupational History  .  Occupation: Scientist, forensic: [employer]  . Financial resource strain: Not on file  . Food insecurity:    Worry: Not on file    Inability: Not on file  . Transportation needs:    Medical: Not on file    Non-medical: Not on file  Tobacco Use  . Smoking status: Former Smoker    Packs/day: 1.00    Years: 8.00    Pack years: 8.00    Types: Cigarettes    Last attempt to quit: 2000    Years since quitting: 19.5  . Smokeless tobacco: Never Used  Substance and Sexual Activity  . Alcohol use: No    Alcohol/week: 0.0 oz    Frequency: Never  . Drug use: Yes    Frequency: 1.0 times per week    Types: Marijuana    Comment: occasional marijuana use  . Sexual activity: Yes    Partners: Female  Lifestyle  . Physical activity:    Days per week: Not on file    Minutes per session: Not on file  . Stress: Not on file  Relationships  . Social connections:    Talks on phone: Not on file    Gets together: Not on file    Attends religious service: Not on file  Active member of club or organization: Not on file    Attends meetings of clubs or organizations: Not on file    Relationship status: Not on file  . Intimate partner violence:    Fear of current or ex partner: Not on file    Emotionally abused: Not on file    Physically abused: Not on file    Forced sexual activity: Not on file  Other Topics Concern  . Not on file  Social History Narrative  . Not on file    Review of Systems: See HPI, otherwise negative ROS  Physical Exam: BP 111/79   Pulse (!) 57   Temp (!) 96.4 F (35.8 C) (Tympanic)   Resp 18   Ht 6\' 1"  (1.854 m)   Wt 175 lb (79.4 kg)   SpO2 100%   BMI 23.09 kg/m  General:   Alert,  pleasant and cooperative in NAD Head:  Normocephalic and atraumatic. Neck:  Supple; no masses or thyromegaly. Lungs:  Clear throughout to auscultation, normal respiratory effort.    Heart:  +S1, +S2, Regular rate and rhythm, No edema. Abdomen:  Soft, nontender and  nondistended. Normal bowel sounds, without guarding, and without rebound.   Neurologic:  Alert and  oriented x4;  grossly normal neurologically.  Impression/Plan: Douglas Glaze. is here for an colonoscopy to be performed for rectal bleeding Risks, benefits, limitations, and alternatives regarding  colonoscopy have been reviewed with the patient.  Questions have been answered.  All parties agreeable.   Douglas Bellows, MD  05/04/2018, 10:46 AM

## 2018-05-04 NOTE — Op Note (Signed)
[facility] Gastroenterology Patient Name: Douglas Abbott Procedure Date: 05/04/2018 10:41 AM MRN: [id number] Account #: [id number] Date of Birth: 08-14-1966 Admit Type: Outpatient Age: 52 Room: [facility] ENDO ROOM 4 Gender: Male Note Status: Finalized Procedure:            Colonoscopy Indications:          Rectal bleeding Providers:            Jonathon Bellows MD, MD Referring MD:         Arnetha Courser (Referring MD) Medicines:            Monitored Anesthesia Care Complications:        No immediate complications. Procedure:            Pre-Anesthesia Assessment:                       - Prior to the procedure, a History and Physical was                        performed, and patient medications, allergies and                        sensitivities were reviewed. The patient's tolerance of                        previous anesthesia was reviewed.                       - The risks and benefits of the procedure and the                        sedation options and risks were discussed with the                        patient. All questions were answered and informed                        consent was obtained.                       - ASA Grade Assessment: II - A patient with mild                        systemic disease.                       After obtaining informed consent, the colonoscope was                        passed under direct vision. Throughout the procedure,                        the patient's blood pressure, pulse, and oxygen                        saturations were monitored continuously. The                        Colonoscope was introduced through the anus and                        advanced to  the the cecum, identified by the                        appendiceal orifice, IC valve and transillumination.                        The colonoscopy was performed with ease. The patient                        tolerated the procedure well. The quality of the bowel            preparation was good. Findings:      The perianal and digital rectal examinations were normal.      Three sessile polyps were found in the sigmoid colon, ascending colon       and cecum. The polyps were 3 to 4 mm in size. These polyps were removed       with a cold snare. Resection and retrieval were complete.      Non-bleeding internal hemorrhoids were found during retroflexion. The       hemorrhoids were medium-sized and Grade I (internal hemorrhoids that do       not prolapse).      No additional abnormalities were found on retroflexion.      The exam was otherwise without abnormality on direct and retroflexion       views. Impression:           - Three 3 to 4 mm polyps in the sigmoid colon, in the                        ascending colon and in the cecum, removed with a cold                        snare. Resected and retrieved.                       - Non-bleeding internal hemorrhoids.                       - The examination was otherwise normal on direct and                        retroflexion views. Recommendation:       - Discharge patient to home (with escort).                       - Resume previous diet.                       - Continue present medications.                       - Await pathology results.                       - Repeat colonoscopy for surveillance based on                        pathology results. Procedure Code(s):    --- Professional ---                       [phone], Colonoscopy, flexible; with removal of tumor(s),  polyp(s), or other lesion(s) by snare technique Diagnosis Code(s):    --- Professional ---                       D12.5, Benign neoplasm of sigmoid colon                       D12.0, Benign neoplasm of cecum                       D12.2, Benign neoplasm of ascending colon                       K64.0, First degree hemorrhoids                       K62.5, Hemorrhage of anus and rectum CPT copyright 2017 American Medical  Association. All rights reserved. The codes documented in this report are preliminary and upon coder review may  be revised to meet current compliance requirements. Jonathon Bellows, MD Jonathon Bellows MD, MD 05/04/2018 11:26:40 AM This report has been signed electronically. Number of Addenda: 0 Note Initiated On: 05/04/2018 10:41 AM Total Procedure Duration: 0 hours 16 minutes 27 seconds       [facility], [facility]

## 2018-05-04 NOTE — Anesthesia Preprocedure Evaluation (Signed)
Anesthesia Evaluation  Patient identified by MRN, date of birth, ID band Patient awake    Reviewed: Allergy & Precautions, NPO status , Patient's Chart, lab work & pertinent test results  Airway Mallampati: II  TM Distance: >3 FB     Dental   Pulmonary asthma , former smoker,    Pulmonary exam normal        Cardiovascular negative cardio ROS Normal cardiovascular exam     Neuro/Psych negative neurological ROS  negative psych ROS   GI/Hepatic Neg liver ROS, GERD  ,  Endo/Other  negative endocrine ROS  Renal/GU negative Renal ROS  negative genitourinary   Musculoskeletal negative musculoskeletal ROS (+)   Abdominal Normal abdominal exam  (+)   Peds negative pediatric ROS (+)  Hematology negative hematology ROS (+)   Anesthesia Other Findings   Reproductive/Obstetrics                             Anesthesia Physical Anesthesia Plan  ASA: II  Anesthesia Plan: General   Post-op Pain Management:    Induction: Intravenous  PONV Risk Score and Plan:   Airway Management Planned: Nasal Cannula  Additional Equipment:   Intra-op Plan:   Post-operative Plan:   Informed Consent: I have reviewed the patients History and Physical, chart, labs and discussed the procedure including the risks, benefits and alternatives for the proposed anesthesia with the patient or authorized representative who has indicated his/her understanding and acceptance.   Dental advisory given  Plan Discussed with: CRNA and Surgeon  Anesthesia Plan Comments:         Anesthesia Quick Evaluation

## 2018-05-05 ENCOUNTER — Encounter: Payer: Self-pay | Admitting: Gastroenterology

## 2018-05-05 LAB — SURGICAL PATHOLOGY

## 2018-05-25 ENCOUNTER — Encounter: Payer: Self-pay | Admitting: Gastroenterology

## 2018-06-29 ENCOUNTER — Encounter: Payer: Self-pay | Admitting: Family Medicine

## 2018-06-29 ENCOUNTER — Ambulatory Visit (INDEPENDENT_AMBULATORY_CARE_PROVIDER_SITE_OTHER): Payer: 59 | Admitting: Family Medicine

## 2018-06-29 VITALS — BP 112/72 | HR 67 | Temp 97.9°F | Resp 14 | Ht 73.0 in | Wt 171.3 lb

## 2018-06-29 DIAGNOSIS — K625 Hemorrhage of anus and rectum: Secondary | ICD-10-CM | POA: Diagnosis not present

## 2018-06-29 DIAGNOSIS — J4540 Moderate persistent asthma, uncomplicated: Secondary | ICD-10-CM | POA: Diagnosis not present

## 2018-06-29 DIAGNOSIS — E789 Disorder of lipoprotein metabolism, unspecified: Secondary | ICD-10-CM | POA: Diagnosis not present

## 2018-06-29 DIAGNOSIS — J309 Allergic rhinitis, unspecified: Secondary | ICD-10-CM | POA: Diagnosis not present

## 2018-06-29 DIAGNOSIS — E559 Vitamin D deficiency, unspecified: Secondary | ICD-10-CM

## 2018-06-29 DIAGNOSIS — Z23 Encounter for immunization: Secondary | ICD-10-CM | POA: Diagnosis not present

## 2018-06-29 DIAGNOSIS — Z5181 Encounter for therapeutic drug level monitoring: Secondary | ICD-10-CM

## 2018-06-29 MED ORDER — ECONAZOLE NITRATE 1 % EX CREA: TOPICAL_CREAM | Freq: Every day | CUTANEOUS | 0 refills | Status: DC

## 2018-06-29 NOTE — Assessment & Plan Note (Signed)
Check level and supplement if needed 

## 2018-06-29 NOTE — Assessment & Plan Note (Signed)
Resolved; already had colonoscopy

## 2018-06-29 NOTE — Assessment & Plan Note (Signed)
He is content with medicines; will have him contact ENT for allergy treatment, b/c control of allergies helps control of asthma

## 2018-06-29 NOTE — Patient Instructions (Addendum)
Please call Dr. Reola Mosher office to find out about starting allergy treatment [phone]  Try to limit saturated fats in your diet (bologna, hot dogs, barbeque, cheeseburgers, hamburgers, steak, bacon, sausage, cheese, etc.) and get more fresh fruits, vegetables, and whole grains  We'll get labs today If you have not heard anything from my staff in a week about any orders/referrals/studies from today, please contact us here to follow-up [phone]

## 2018-06-29 NOTE — Assessment & Plan Note (Signed)
Continue meds; avoid triggers

## 2018-06-29 NOTE — Assessment & Plan Note (Signed)
Check lipids; limit saturated fats 

## 2018-06-29 NOTE — Progress Notes (Signed)
BP 112/72   Pulse 67   Temp 97.9 F (36.6 C) (Oral)   Resp 14   Ht 6\' 1"  (1.854 m)   Wt 171 lb 4.8 oz (77.7 kg)   SpO2 99%   BMI 22.60 kg/m    Subjective:    Patient ID: Douglas Glaze., male    DOB: 02/04/1966, 52 y.o.   MRN: [id number]  HPI: Douglas Abbott. is a 52 y.o. male  Chief Complaint  Patient presents with  . Follow-up  . Headache    HPI Here for f/u  Allergic rhinitis; taking medicine, tries to avoid triggers; sensitive to foods, jaw will swell up; takes a benadryl and it goes down; avoids what triggers him; not trying new foods; offered allergist referral, but he already had the testing and was supposed to start shots, but he couldn't afford $85 a week for the treatment; then it fell through the cracks; not having throat swelling; they will mow grass at work and he will have to go to the break room; they will sometimes have a jam up at work, [employer] and he has to avoid that  Asthma; using symbicort; using SABA 6 times a week right now, but sometimes taking it if not needed, just for prevention; PNA vaccine UTD  Had BRBPR; had colonoscopy done; had three polyps done Good eater, eating more greens  Hx of vitamin D deficiency, 13 in May 2018; he would like to know if it is okay; he took pills Rx like he was supposed to, then no more refills; energy level is okay; not taking any supplement; not much time in the sun  Mildly elevated cholesterol on previous labs; does eat steaks, "I love steak"  GERD; doing well on the medicine; missed a dose and could tell after eating something  Fungus in toes, in between; steel toe shoes at work; wearing hot tennis shoes Depression screen [facility] 2/9 06/29/2018 06/29/2018 03/27/2018 09/16/2017 06/02/2017  Decreased Interest 0 0 0 0 0  Down, Depressed, Hopeless 0 0 0 0 0  PHQ - 2 Score 0 0 0 0 0  Altered sleeping 0 - - - -  Tired, decreased energy 0 - - - -  Change in appetite 0 - - - -  Feeling bad or failure about yourself   0 - - - -  Trouble concentrating 0 - - - -  Moving slowly or fidgety/restless 0 - - - -  Suicidal thoughts 0 - - - -  PHQ-9 Score 0 - - - -  Difficult doing work/chores Not difficult at all - - - -    Relevant past medical, surgical, family and social history reviewed Past Medical History:  Diagnosis Date  . Allergic rhinitis   . Allergy   . Asthma   . Decreased libido   . History of pleurisy   . Sinus bradycardia    a. 03/2017 Echo: Ef 50-55%.   Past Surgical History:  Procedure Laterality Date  . COLONOSCOPY WITH PROPOFOL N/A 05/04/2018   Procedure: COLONOSCOPY WITH PROPOFOL;  Surgeon: Jonathon Bellows, MD;  Location: [facility] ENDOSCOPY;  Service: Gastroenterology;  Laterality: N/A;  . NO PAST SURGERIES     Family History  Problem Relation Age of Onset  . Asthma Mother   . Lung cancer Mother   . Kidney disease Mother   . Heart disease Neg Hx    Social History   Tobacco Use  . Smoking status: Former Smoker    Packs/day:  1.00    Years: 8.00    Pack years: 8.00    Types: Cigarettes    Last attempt to quit: 2000    Years since quitting: 19.7  . Smokeless tobacco: Never Used  Substance Use Topics  . Alcohol use: No    Alcohol/week: 0.0 standard drinks    Frequency: Never  . Drug use: Yes    Frequency: 1.0 times per week    Types: Marijuana    Comment: occasional marijuana use    Interim medical history since last visit reviewed. Allergies and medications reviewed  Review of Systems Per HPI unless specifically indicated above     Objective:    BP 112/72   Pulse 67   Temp 97.9 F (36.6 C) (Oral)   Resp 14   Ht 6\' 1"  (1.854 m)   Wt 171 lb 4.8 oz (77.7 kg)   SpO2 99%   BMI 22.60 kg/m   Wt Readings from Last 3 Encounters:  06/29/18 171 lb 4.8 oz (77.7 kg)  05/04/18 175 lb (79.4 kg)  04/15/18 171 lb 3.2 oz (77.7 kg)    Physical Exam  Constitutional: He appears well-developed and well-nourished. No distress.  HENT:  Head: Normocephalic and atraumatic.    Eyes: EOM are normal. No scleral icterus.  Neck: No thyromegaly present.  Cardiovascular: Normal rate and regular rhythm.  Pulmonary/Chest: Effort normal and breath sounds normal.  Abdominal: Soft. Bowel sounds are normal. He exhibits no distension.  Musculoskeletal: He exhibits no edema.  Neurological: Coordination normal.  Skin: Skin is warm and dry. No pallor.  Psychiatric: He has a normal mood and affect. His behavior is normal. Judgment and thought content normal.    Results for orders placed or performed during the hospital encounter of 05/04/18  Surgical pathology  Result Value Ref Range   SURGICAL PATHOLOGY      Surgical Pathology CASE: [id number] PATIENT: Douglas Abbott Surgical Pathology Report     SPECIMEN SUBMITTED: A. Colon polyp, cecum;cold snare B. Colon polyp, ascending;cold snare C. Colon polyp, sigmoid;cold snare  CLINICAL HISTORY: None provided  PRE-OPERATIVE DIAGNOSIS: Rectal bleed  POST-OPERATIVE DIAGNOSIS: None provided.     DIAGNOSIS: A.  COLON POLYP, CECUM; COLD SNARE: - COLONIC MUCOSA WITH EDEMA AND FOCAL CRYPTITIS. - NEGATIVE FOR DYSPLASIA AND MALIGNANCY.  Note: Sections show focal active colitis in the form of a single neutrophil within a crypt.  Architectural features of chronicity are absent.  The findings are suggestive of bowel preparation artifact. Clinical correlation is necessary.  B.  COLON POLYP, ASCENDING; COLD SNARE: - TUBULAR ADENOMA. - NEGATIVE FOR HIGH-GRADE DYSPLASIA AND MALIGNANCY.  C.  COLON POLYP, SIGMOID; COLD SNARE: - HYPERPLASTIC EPITHELIAL CHANGE. - NEGATIVE FOR DYSPLASIA AND MALIGNANCY.   GROSS DESCRIPTION : A. Labeled: Cecum polyp cold snare Received: In formalin Tissue fragment(s): 1 Size: 0.15 cm Description: Tan fragment Entirely submitted in one cassette.  B. Labeled: Ascending colon polyp cold snare Received: In formalin Tissue fragment(s): 1 Size: 0.3 cm Description: Tan  fragment Entirely submitted in one cassette.  C. Labeled: Sigmoid colon Polyp cold snare Received: In formalin Tissue fragment(s): 1 Size: 0.4 cm Description: Pink fragment Entirely submitted in one cassette.    Final Diagnosis performed by Delorse Lek, MD.   Electronically signed 05/05/2018 4:24:18PM The electronic signature indicates that the named Attending Pathologist has evaluated the specimen  Technical component performed at [facility], [address], [place], [place] [address] Lab: [phone] Dir: Rush Farmer, MD, MMM  Professional component performed at [facility], [facility]  [facility], [place], [place], [place] [address] Lab: [phone] Dir: Dellia Nims. Rubinas,  MD       Assessment & Plan:   Problem List Items Addressed This Visit      Respiratory   Asthma, chronic - Primary (Chronic)    He is content with medicines; will have him contact ENT for allergy treatment, b/c control of allergies helps control of asthma      Relevant Orders   IgE   Allergic rhinitis (Chronic)    Continue meds; avoid triggers      Relevant Orders   IgE     Digestive   BRBPR (bright red blood per rectum)    Resolved; already had colonoscopy        Other   Vitamin D deficiency    Check level and supplement if needed      Relevant Orders   VITAMIN D 25 Hydroxy (Vit-D Deficiency, Fractures)   Borderline high cholesterol    Check lipids; limit saturated fats      Relevant Orders   Lipid panel    Other Visit Diagnoses    Medication monitoring encounter       Relevant Orders   COMPLETE METABOLIC PANEL WITH GFR   Need for influenza vaccination       Relevant Orders   Flu Vaccine QUAD 6+ mos PF IM (Fluarix Quad PF) (Completed)       Follow up plan: Return in about 6 months (around 12/28/2018) for follow-up visit with Dr. Sanda Klein.  An after-visit summary was printed and given to the patient at [place].  Please see the patient instructions which may contain  other information and recommendations beyond what is mentioned above in the assessment and plan.  Meds ordered this encounter  Medications  . econazole nitrate 1 % cream    Sig: Apply topically daily.    Dispense:  15 g    Refill:  0    Orders Placed This Encounter  Procedures  . Flu Vaccine QUAD 6+ mos PF IM (Fluarix Quad PF)  . COMPLETE METABOLIC PANEL WITH GFR  . Lipid panel  . IgE  . VITAMIN D 25 Hydroxy (Vit-D Deficiency, Fractures)

## 2018-06-30 ENCOUNTER — Other Ambulatory Visit: Payer: Self-pay | Admitting: Family Medicine

## 2018-06-30 DIAGNOSIS — J4540 Moderate persistent asthma, uncomplicated: Secondary | ICD-10-CM

## 2018-06-30 DIAGNOSIS — R768 Other specified abnormal immunological findings in serum: Secondary | ICD-10-CM

## 2018-06-30 LAB — LIPID PANEL
CHOLESTEROL: 187 mg/dL (ref <200)
HDL: 67 mg/dL (ref >40)
LDL CHOLESTEROL (CALC): 108 mg/dL — AB
Non-HDL Cholesterol (Calc): 120 mg/dL (calc) (ref <130)
TRIGLYCERIDES: 41 mg/dL (ref <150)
Total CHOL/HDL Ratio: 2.8 (calc) (ref <5.0)

## 2018-06-30 LAB — IGE: IGE (IMMUNOGLOBULIN E), SERUM: 1121 kU/L — AB (ref <=114)

## 2018-06-30 LAB — COMPLETE METABOLIC PANEL WITH GFR
AG Ratio: 1.9 (calc) (ref 1.0–2.5)
ALKALINE PHOSPHATASE (APISO): 117 U/L — AB (ref 40–115)
ALT: 16 U/L (ref 9–46)
AST: 17 U/L (ref 10–35)
Albumin: 4.2 g/dL (ref 3.6–5.1)
BUN: 17 mg/dL (ref 7–25)
CALCIUM: 9 mg/dL (ref 8.6–10.3)
CO2: 27 mmol/L (ref 20–32)
CREATININE: 1.03 mg/dL (ref 0.70–1.33)
Chloride: 106 mmol/L (ref 98–110)
GFR, EST NON AFRICAN AMERICAN: 84 mL/min/{1.73_m2} (ref >=60)
GFR, Est African American: 97 mL/min/{1.73_m2} (ref >=60)
GLUCOSE: 96 mg/dL (ref 65–99)
Globulin: 2.2 g/dL (calc) (ref 1.9–3.7)
Potassium: 3.9 mmol/L (ref 3.5–5.3)
Sodium: 141 mmol/L (ref 135–146)
Total Bilirubin: 0.7 mg/dL (ref 0.2–1.2)
Total Protein: 6.4 g/dL (ref 6.1–8.1)

## 2018-06-30 LAB — VITAMIN D 25 HYDROXY (VIT D DEFICIENCY, FRACTURES): VIT D 25 HYDROXY: 17 ng/mL — AB (ref 30–100)

## 2018-06-30 MED ORDER — KETOCONAZOLE 2 % EX CREA: 1.0000 "application " | TOPICAL_CREAM | Freq: Every day | CUTANEOUS | 0 refills | Status: DC

## 2018-06-30 MED ORDER — VITAMIN D (ERGOCALCIFEROL) 1.25 MG (50000 UNIT) PO CAPS: 50000.0000 [IU] | ORAL_CAPSULE | ORAL | 1 refills | Status: AC

## 2018-06-30 NOTE — Progress Notes (Signed)
Switch topicals due to ins coverage

## 2018-06-30 NOTE — Progress Notes (Signed)
Refer to pulm, elevated IgE

## 2018-08-25 ENCOUNTER — Telehealth: Payer: Self-pay | Admitting: Gastroenterology

## 2018-08-25 NOTE — Telephone Encounter (Signed)
Pt is calling he has noticed blood in his stool since having a colonoscopy I scheduled him for 09/17/11 with Dr. Vicente Males.

## 2018-08-26 ENCOUNTER — Ambulatory Visit (INDEPENDENT_AMBULATORY_CARE_PROVIDER_SITE_OTHER): Payer: 59

## 2018-08-26 ENCOUNTER — Encounter: Payer: Self-pay | Admitting: Emergency Medicine

## 2018-08-26 ENCOUNTER — Ambulatory Visit
Admission: EM | Admit: 2018-08-26 | Discharge: 2018-08-26 | Disposition: A | Discharge status: Home / Self Care | Payer: 59 | Attending: Family Medicine | Admitting: Family Medicine

## 2018-08-26 ENCOUNTER — Other Ambulatory Visit: Payer: Self-pay

## 2018-08-26 DIAGNOSIS — R079 Chest pain, unspecified: Secondary | ICD-10-CM | POA: Insufficient documentation

## 2018-08-26 DIAGNOSIS — X58XXXA Exposure to other specified factors, initial encounter: Secondary | ICD-10-CM | POA: Diagnosis not present

## 2018-08-26 DIAGNOSIS — M940 Chondrocostal junction syndrome [Tietze]: Secondary | ICD-10-CM

## 2018-08-26 DIAGNOSIS — S29012A Strain of muscle and tendon of back wall of thorax, initial encounter: Secondary | ICD-10-CM | POA: Diagnosis not present

## 2018-08-26 DIAGNOSIS — Z87891 Personal history of nicotine dependence: Secondary | ICD-10-CM | POA: Insufficient documentation

## 2018-08-26 DIAGNOSIS — J449 Chronic obstructive pulmonary disease, unspecified: Secondary | ICD-10-CM

## 2018-08-26 DIAGNOSIS — E7800 Pure hypercholesterolemia, unspecified: Secondary | ICD-10-CM | POA: Diagnosis not present

## 2018-08-26 DIAGNOSIS — K219 Gastro-esophageal reflux disease without esophagitis: Secondary | ICD-10-CM | POA: Diagnosis not present

## 2018-08-26 DIAGNOSIS — E559 Vitamin D deficiency, unspecified: Secondary | ICD-10-CM | POA: Diagnosis not present

## 2018-08-26 DIAGNOSIS — Z79899 Other long term (current) drug therapy: Secondary | ICD-10-CM | POA: Diagnosis not present

## 2018-08-26 DIAGNOSIS — R001 Bradycardia, unspecified: Secondary | ICD-10-CM

## 2018-08-26 DIAGNOSIS — Z8249 Family history of ischemic heart disease and other diseases of the circulatory system: Secondary | ICD-10-CM | POA: Insufficient documentation

## 2018-08-26 MED ORDER — CYCLOBENZAPRINE HCL 10 MG PO TABS: 10.0000 mg | ORAL_TABLET | Freq: Three times a day (TID) | ORAL | 0 refills | Status: DC | PRN

## 2018-08-26 NOTE — ED Triage Notes (Signed)
Pt ambulatory pulse ox 99% pulse 86

## 2018-08-26 NOTE — Discharge Instructions (Signed)
Ibuprofen 600mg  three times daily or naproxen (Aleve) two tablets twice a day Heat to upper back area  Recommend follow up with Primary Care Provider and cardiology for your low heart rate

## 2018-08-26 NOTE — ED Triage Notes (Signed)
Patient c/o chest pain, upper back pain and neck pain that started yesterday morning but states pain got worse early this morning. Patient denies SOB or difficulty breathing.

## 2018-08-26 NOTE — ED Provider Notes (Signed)
[facility]    CSN: [id number] Arrival date & time: 08/26/18  0913     History   Chief Complaint Chief Complaint  Patient presents with  . Chest Pain    HPI Douglas L Tipton Ballow. is a 52 y.o. male.   52 yo male with a c/o chest pain, upper back pain, shoulder pain, since yesterday morning. Patient states pain is sharp, shooting and worse with left arm movement or head/neck movement. States he woke up yesterday morning with the pain. Denies any shortness of breath, fevers, chills, nausea, diaphoresis. States he does a lot of physical activity, walking, lifting at work. Denies any specific injury.   Patient reports a h/o of chronic bradycardia for years. Last year he was found to have a heart rate of 38 on an EKG at his PCPs office and was referred to cardiology and underwent evaluation.   Patient states he exercises by walking frequently and denies chest pains or shortness of breath with exertion.  The history is provided by the patient.    Past Medical History:  Diagnosis Date  . Allergic rhinitis   . Allergy   . Asthma   . Decreased libido   . History of pleurisy   . Sinus bradycardia    a. 03/2017 Echo: Ef 50-55%.    Patient Active Problem List   Diagnosis Date Noted  . Borderline high cholesterol 06/29/2018  . Vitamin D deficiency 06/29/2018  . Need for pneumococcal vaccination 03/27/2018  . BRBPR (bright red blood per rectum) 03/27/2018  . Right shoulder pain 09/16/2017  . GERD (gastroesophageal reflux disease) 01/02/2017  . Blood in the urine 09/11/2015  . Asthma, chronic 03/09/2015  . Allergic rhinitis 03/09/2015    Past Surgical History:  Procedure Laterality Date  . COLONOSCOPY WITH PROPOFOL N/A 05/04/2018   Procedure: COLONOSCOPY WITH PROPOFOL;  Surgeon: Jonathon Bellows, MD;  Location: [facility] ENDOSCOPY;  Service: Gastroenterology;  Laterality: N/A;  . NO PAST SURGERIES         Home Medications    Prior to Admission medications   Medication  Sig Start Date End Date Taking? Authorizing Provider  albuterol (PROVENTIL HFA;VENTOLIN HFA) 108 (90 Base) MCG/ACT inhaler Inhale 1-2 puffs into the lungs every 6 (six) hours as needed for wheezing or shortness of breath. 03/27/18  Yes Lada, Satira Anis, MD  budesonide-formoterol (SYMBICORT) 160-4.5 MCG/ACT inhaler Inhale 2 puffs into the lungs 2 (two) times daily. 03/27/18  Yes Lada, Satira Anis, MD  montelukast (SINGULAIR) 10 MG tablet Take 1 tablet (10 mg total) by mouth at bedtime. 03/27/18  Yes Lada, Satira Anis, MD  omeprazole (PRILOSEC) 20 MG capsule Take 1 capsule (20 mg total) by mouth 2 (two) times daily. 09/16/17  Yes Roselee Nova, MD  cyclobenzaprine (FLEXERIL) 10 MG tablet Take 1 tablet (10 mg total) by mouth 3 (three) times daily as needed for muscle spasms. 08/26/18   Norval Gable, MD  ketoconazole (NIZORAL) 2 % cream Apply 1 application topically daily. 06/30/18   Lada, Satira Anis, MD  mometasone (NASONEX) 50 MCG/ACT nasal spray Place 2 sprays into the nose daily. 03/27/18   Arnetha Courser, MD    Family History Family History  Problem Relation Age of Onset  . Asthma Mother   . Lung cancer Mother   . Kidney disease Mother   . Heart disease Neg Hx     Social History Social History   Tobacco Use  . Smoking status: Former Smoker    Packs/day: 1.00  Years: 8.00    Pack years: 8.00    Types: Cigarettes    Last attempt to quit: 2000    Years since quitting: 19.8  . Smokeless tobacco: Never Used  Substance Use Topics  . Alcohol use: No    Alcohol/week: 0.0 standard drinks    Frequency: Never  . Drug use: Yes    Frequency: 1.0 times per week    Types: Marijuana    Comment: occasional marijuana use     Allergies   Shellfish allergy   Review of Systems Review of Systems   Physical Exam Triage Vital Signs ED Triage Vitals  Enc Vitals Group     BP 08/26/18 0922 115/77     Pulse Rate 08/26/18 0922 (!) 56     Resp 08/26/18 0922 16     Temp 08/26/18 0922 97.7 F  (36.5 C)     Temp Source 08/26/18 0922 Oral     SpO2 08/26/18 0922 100 %     Weight 08/26/18 0919 165 lb (74.8 kg)     Height 08/26/18 0919 6\' 2"  (1.88 m)     Head Circumference --      Peak Flow --      Pain Score 08/26/18 0919 5     Pain Loc --      Pain Edu? --      Excl. in [place]? --    No data found.  Updated Vital Signs BP 115/77 (BP Location: Left Arm)   Pulse (!) 56   Temp 97.7 F (36.5 C) (Oral)   Resp 16   Ht 6\' 2"  (1.88 m)   Wt 74.8 kg   SpO2 100%   BMI 21.18 kg/m   Visual Acuity Right Eye Distance:   Left Eye Distance:   Bilateral Distance:    Right Eye Near:   Left Eye Near:    Bilateral Near:     Physical Exam  Constitutional: He appears well-developed and well-nourished. No distress.  Cardiovascular: Regular rhythm, normal heart sounds and intact distal pulses. Bradycardia present.  Pulmonary/Chest: Effort normal and breath sounds normal. No stridor. No respiratory distress. He has no wheezes. He has no rales. He exhibits tenderness (left upper lateral chest wall tenderness reproducible ).  Musculoskeletal:       Cervical back: He exhibits tenderness (over the left trapezius muscle, reproducible) and spasm. He exhibits normal range of motion, no bony tenderness, no swelling, no edema, no deformity, no laceration and normal pulse.  Skin: He is not diaphoretic.  Nursing note and vitals reviewed.    UC Treatments / Results  Labs (all labs ordered are listed, but only abnormal results are displayed) Labs Reviewed - No data to display  EKG None  Radiology Dg Chest 2 View  Result Date: 08/26/2018 CLINICAL DATA:  Awakened yesterday with left-sided neck and posterior shoulder discomfort. Subsequent development of diaphoresis, hot sensation. History of asthma-bronchitis, former smoker. EXAM: CHEST - 2 VIEW COMPARISON:  PA and lateral chest x-ray of January 06, 2016 FINDINGS: The lungs are hyperinflated and clear. The heart and pulmonary vascularity are  normal. There is no pleural effusion or pneumothorax. The mediastinum is normal in width. The bony thorax exhibits no acute abnormality. There is chronic gentle levocurvature of the thoracic spine centered at approximately T10. IMPRESSION: COPD-reactive airway disease. No alveolar pneumonia, CHF, nor other acute cardiopulmonary abnormality. Electronically Signed   By: David  Martinique M.D.   On: 08/26/2018 09:55    Procedures ED EKG Date/Time: 08/26/2018 7:34  PM Performed by: Norval Gable, MD Authorized by: Norval Gable, MD   ECG reviewed by ED Physician in the absence of a cardiologist: yes   Previous ECG:    Previous ECG:  Compared to current   Similarity:  No change Interpretation:    Interpretation: abnormal   Rate:    ECG rate:  49   ECG rate assessment: bradycardic   Rhythm:    Rhythm: sinus bradycardia   Ectopy:    Ectopy: none   QRS:    QRS axis:  Normal Conduction:    Conduction: normal   ST segments:    ST segments:  Normal T waves:    T waves: normal     (including critical care time)  Medications Ordered in UC Medications - No data to display  Initial Impression / Assessment and Plan / UC Course  I have reviewed the triage vital signs and the nursing notes.  Pertinent labs & imaging results that were available during my care of the patient were reviewed by me and considered in my medical decision making (see chart for details).      Final Clinical Impressions(s) / UC Diagnoses   Final diagnoses:  Upper back strain, initial encounter  Costochondritis  Chronic sinus bradycardia     Discharge Instructions     Ibuprofen 600mg  three times daily or naproxen (Aleve) two tablets twice a day Heat to upper back area  Recommend follow up with Primary Care Provider and cardiology for your low heart rate     ED Prescriptions    Medication Sig Dispense Auth. Provider   cyclobenzaprine (FLEXERIL) 10 MG tablet Take 1 tablet (10 mg total) by mouth 3 (three)  times daily as needed for muscle spasms. 30 tablet Nehemyah Foushee, Linward Foster, MD     1. ekg results and diagnosis reviewed with patient 2. rx as per orders above; reviewed possible side effects, interactions, risks and benefits  3. Recommend supportive treatment as above 4. Recommend follow up with pcp and cardiology for bradycardia 4. Follow-up prn if symptoms worsen or don't improve    Controlled Substance Prescriptions [place] Controlled Substance Registry consulted? Not Applicable   Norval Gable, MD 08/26/18 1944

## 2018-08-28 ENCOUNTER — Telehealth: Payer: Self-pay | Admitting: Family Medicine

## 2018-08-28 NOTE — Telephone Encounter (Signed)
Copied from [place] [id number]. Topic: General - Other >> Aug 28, 2018  1:37 PM Alfredia Ferguson R wrote: Patient called in and stated he had to leave work and go to urgent care and wants a call from Dr Sanda Klein to explain why

## 2018-08-31 NOTE — Telephone Encounter (Signed)
Left detailed VM.  

## 2018-08-31 NOTE — Telephone Encounter (Signed)
I reviewed the notes from November 13th, so I have all that information in the chart Please contact patient and see if he needs a f/u appointment here Make sure he has seen cardiologist about his slow heart rate

## 2018-09-14 ENCOUNTER — Ambulatory Visit: Payer: 59 | Admitting: Family Medicine

## 2018-09-16 ENCOUNTER — Ambulatory Visit: Payer: 59 | Admitting: Gastroenterology

## 2018-09-16 ENCOUNTER — Encounter: Payer: Self-pay | Admitting: Gastroenterology

## 2018-09-24 ENCOUNTER — Encounter: Payer: Self-pay | Admitting: Gastroenterology

## 2018-09-24 ENCOUNTER — Ambulatory Visit (INDEPENDENT_AMBULATORY_CARE_PROVIDER_SITE_OTHER): Payer: 59 | Admitting: Gastroenterology

## 2018-09-24 VITALS — BP 104/65 | HR 61 | Ht 73.0 in | Wt 171.6 lb

## 2018-09-24 DIAGNOSIS — K625 Hemorrhage of anus and rectum: Secondary | ICD-10-CM

## 2018-09-24 DIAGNOSIS — K648 Other hemorrhoids: Secondary | ICD-10-CM | POA: Diagnosis not present

## 2018-09-24 NOTE — Progress Notes (Signed)
Jonathon Bellows MD, MRCP(U.K) [address]  [place]  [place], [place] [address]  Main: [phone]  Fax: [phone]   Primary Care Physician: Arnetha Courser, MD  Primary Gastroenterologist:  Dr. Jonathon Bellows   Chief Complaint  Patient presents with  . Follow-up    Rectal Bleeding    HPI: Douglas Abbott. is a 52 y.o. male   Summary of history :  He was initially seen on 04/2018 for rectal bleeding . Had a colonoscopy in 05/04/2018 , 3 tiny polyps excised which were benign  and was noted to have internal hemorroids.   Interval history   05/04/2018-  09/24/2018   Still has had rectal bleeding on and off, last episode was 10 days back. Stools are not usually hard when it occurs, sometimes he sees it only on the toilet paper.      Current Outpatient Medications  Medication Sig Dispense Refill  . albuterol (PROVENTIL HFA;VENTOLIN HFA) 108 (90 Base) MCG/ACT inhaler Inhale 1-2 puffs into the lungs every 6 (six) hours as needed for wheezing or shortness of breath. 1 Inhaler 3  . budesonide-formoterol (SYMBICORT) 160-4.5 MCG/ACT inhaler Inhale 2 puffs into the lungs 2 (two) times daily. 3 Inhaler 3  . cyclobenzaprine (FLEXERIL) 10 MG tablet Take 1 tablet (10 mg total) by mouth 3 (three) times daily as needed for muscle spasms. 30 tablet 0  . ketoconazole (NIZORAL) 2 % cream Apply 1 application topically daily. 30 g 0  . mometasone (NASONEX) 50 MCG/ACT nasal spray Place 2 sprays into the nose daily. 51 g 3  . montelukast (SINGULAIR) 10 MG tablet Take 1 tablet (10 mg total) by mouth at bedtime. 90 tablet 3  . omeprazole (PRILOSEC) 20 MG capsule Take 1 capsule (20 mg total) by mouth 2 (two) times daily. 180 capsule 1   No current facility-administered medications for this visit.     Allergies as of 09/24/2018 - Review Complete 09/24/2018  Allergen Reaction Noted  . Shellfish allergy Swelling 03/04/2017  . Peanut-containing drug products  09/24/2018    ROS:  General:  Negative for anorexia, weight loss, fever, chills, fatigue, weakness. ENT: Negative for hoarseness, difficulty swallowing , nasal congestion. CV: Negative for chest pain, angina, palpitations, dyspnea on exertion, peripheral edema.  Respiratory: Negative for dyspnea at rest, dyspnea on exertion, cough, sputum, wheezing.  GI: See history of present illness. GU:  Negative for dysuria, hematuria, urinary incontinence, urinary frequency, nocturnal urination.  Endo: Negative for unusual weight change.    Physical Examination:   BP 104/65   Pulse 61   Ht 6\' 1"  (1.854 m)   Wt 171 lb 9.6 oz (77.8 kg)   BMI 22.64 kg/m   General: Well-nourished, well-developed in no acute distress.  Eyes: No icterus. Conjunctivae pink. Mouth: Oropharyngeal mucosa moist and pink , no lesions erythema or exudate. Lungs: Clear to auscultation bilaterally. Non-labored. Heart: Regular rate and rhythm, no murmurs rubs or gallops.  Abdomen: Bowel sounds are normal, nontender, nondistended, no hepatosplenomegaly or masses, no abdominal bruits or hernia , no rebound or guarding.   Extremities: No lower extremity edema. No clubbing or deformities. Neuro: Alert and oriented x 3.  Grossly intact. Skin: Warm and dry, no jaundice.   Psych: Alert and cooperative, normal mood and affect.   Imaging Studies: Dg Chest 2 View  Result Date: 08/26/2018 CLINICAL DATA:  Awakened yesterday with left-sided neck and posterior shoulder discomfort. Subsequent development of diaphoresis, hot sensation. History of asthma-bronchitis, former smoker. EXAM: CHEST -  2 VIEW COMPARISON:  PA and lateral chest x-ray of January 06, 2016 FINDINGS: The lungs are hyperinflated and clear. The heart and pulmonary vascularity are normal. There is no pleural effusion or pneumothorax. The mediastinum is normal in width. The bony thorax exhibits no acute abnormality. There is chronic gentle levocurvature of the thoracic spine centered at approximately T10.  IMPRESSION: COPD-reactive airway disease. No alveolar pneumonia, CHF, nor other acute cardiopulmonary abnormality. Electronically Signed   By: David  Martinique M.D.   On: 08/26/2018 09:55    Assessment and Plan:   Douglas Abbott. is a 52 y.o. y/o male here to follow up for rectal bleeding likely secondary to internal hemorrhoids.  Since it has failed conservative treatment , will set him up for banding with Dr Marius Ditch    Dr Jonathon Bellows  MD,MRCP [facility]) Follow up with Dr Marius Ditch

## 2018-09-25 ENCOUNTER — Ambulatory Visit: Payer: 59 | Admitting: Family Medicine

## 2018-09-29 ENCOUNTER — Encounter: Payer: Self-pay | Admitting: Family Medicine

## 2018-09-29 ENCOUNTER — Ambulatory Visit (INDEPENDENT_AMBULATORY_CARE_PROVIDER_SITE_OTHER): Payer: 59 | Admitting: Family Medicine

## 2018-09-29 VITALS — BP 110/74 | HR 66 | Temp 98.6°F | Ht 73.0 in | Wt 173.3 lb

## 2018-09-29 DIAGNOSIS — R001 Bradycardia, unspecified: Secondary | ICD-10-CM | POA: Diagnosis not present

## 2018-09-29 DIAGNOSIS — R499 Unspecified voice and resonance disorder: Secondary | ICD-10-CM

## 2018-09-29 DIAGNOSIS — J4540 Moderate persistent asthma, uncomplicated: Secondary | ICD-10-CM

## 2018-09-29 MED ORDER — ALBUTEROL SULFATE HFA 108 (90 BASE) MCG/ACT IN AERS: 1.0000 | INHALATION_SPRAY | Freq: Four times a day (QID) | RESPIRATORY_TRACT | 3 refills | Status: DC | PRN

## 2018-09-29 MED ORDER — BUDESONIDE-FORMOTEROL FUMARATE 160-4.5 MCG/ACT IN AERO: 2.0000 | INHALATION_SPRAY | Freq: Two times a day (BID) | RESPIRATORY_TRACT | 3 refills | Status: DC

## 2018-09-29 NOTE — Assessment & Plan Note (Signed)
Already worked up by cardiologist; no new concerns

## 2018-09-29 NOTE — Assessment & Plan Note (Signed)
Refills provided at his request

## 2018-09-29 NOTE — Progress Notes (Signed)
BP 110/74   Pulse 66   Temp 98.6 F (37 C) (Oral)   Ht 6\' 1"  (1.854 m)   Wt 173 lb 4.8 oz (78.6 kg)   SpO2 99%   BMI 22.86 kg/m    Subjective:    Patient ID: Douglas Abbott., male    DOB: 09-Mar-1966, 52 y.o.   MRN: [id number]  HPI: Douglas Abbott. is a 52 y.o. male  Chief Complaint  Patient presents with  . Follow-up    ER chest/back/shoulder pain    HPI Patient is here for ER follow-up He was seen over one month ago for upper back strain ER note reviewed He had chest pain, upper back pain, shoulder pain; started the morning before the visit Sharp, shooting, worse with left arm movement or movement of head/neck Denied SHOB, fever, chills, nausea, diaphresos Does a lot of physical activity, walking, lifting at work Hx of bradycardia; evaluted by cardiologist the year prior No regular cigarettes but does use marijuana Pain was reproducible on exam in the ER note EKG shows sinus brady Diagnosed with upper back strain, costochondritis He was treated with ibuprofen or aleve PRN, heat topically He says that he was hurting so much; went to the ER in [place]; took the muscle relaxers that evening and was off from work the next few days; that is all better Right-handed, and everything was bothering him on the left side Thought maybe he slept wrong; put the left arm up and pushed the button and it really hurt They had him walk around and his heart rate did not go up enough He had bradycardia, but did not see cardiologist that he can recall I did find two visits with cardiologist, May 2018 and Sept 2018 Heart rate had been 38 bpm at Dr. Trena Platt office He had an echocardiogram [employer] and played basketball; just stopped playing basketball maybe 7-8 years ago No SHOB; asymptomatic No vaping He would like refills of his medicines for his COPD nasonex tastes foul He sings a lot Taking acid reflux still and no symptoms  Chest xray: IMPRESSION: COPD-reactive airway  disease. No alveolar pneumonia, CHF, nor other acute cardiopulmonary abnormality.   Electronically Signed   By: David  Martinique M.D.   On: 08/26/2018 09:55  Depression screen [facility] 2/9 09/29/2018 06/29/2018 06/29/2018 03/27/2018 09/16/2017  Decreased Interest 0 0 0 0 0  Down, Depressed, Hopeless 0 0 0 0 0  PHQ - 2 Score 0 0 0 0 0  Altered sleeping 0 0 - - -  Tired, decreased energy 0 0 - - -  Change in appetite 0 0 - - -  Feeling bad or failure about yourself  0 0 - - -  Trouble concentrating 0 0 - - -  Moving slowly or fidgety/restless 0 0 - - -  Suicidal thoughts 0 0 - - -  PHQ-9 Score 0 0 - - -  Difficult doing work/chores Not difficult at all Not difficult at all - - -   Fall Risk  09/29/2018 06/29/2018 03/27/2018 09/16/2017 06/02/2017  Falls in the past year? 0 No No No No  Number falls in past yr: 0 - - - -  Injury with Fall? 0 - - - -    Relevant past medical, surgical, family and social history reviewed Past Medical History:  Diagnosis Date  . Allergic rhinitis   . Allergy   . Asthma   . Decreased libido   . History of pleurisy   .  Sinus bradycardia    a. 03/2017 Echo: Ef 50-55%.   Past Surgical History:  Procedure Laterality Date  . COLONOSCOPY WITH PROPOFOL N/A 05/04/2018   Procedure: COLONOSCOPY WITH PROPOFOL;  Surgeon: Jonathon Bellows, MD;  Location: [facility] ENDOSCOPY;  Service: Gastroenterology;  Laterality: N/A;  . NO PAST SURGERIES     Family History  Problem Relation Age of Onset  . Asthma Mother   . Lung cancer Mother   . Kidney disease Mother   . Heart disease Neg Hx    Social History   Tobacco Use  . Smoking status: Former Smoker    Packs/day: 1.00    Years: 8.00    Pack years: 8.00    Types: Cigarettes    Last attempt to quit: 2000    Years since quitting: 19.9  . Smokeless tobacco: Never Used  Substance Use Topics  . Alcohol use: No    Alcohol/week: 0.0 standard drinks    Frequency: Never  . Drug use: Yes    Frequency: 1.0 times per week     Types: Marijuana    Comment: occasional marijuana use     Office Visit from 09/29/2018 in [facility]  AUDIT-C Score  0      Interim medical history since last visit reviewed. Allergies and medications reviewed  Review of Systems Per HPI unless specifically indicated above     Objective:    BP 110/74   Pulse 66   Temp 98.6 F (37 C) (Oral)   Ht 6\' 1"  (1.854 m)   Wt 173 lb 4.8 oz (78.6 kg)   SpO2 99%   BMI 22.86 kg/m   Wt Readings from Last 3 Encounters:  09/29/18 173 lb 4.8 oz (78.6 kg)  09/24/18 171 lb 9.6 oz (77.8 kg)  08/26/18 165 lb (74.8 kg)    Physical Exam Constitutional:      General: He is not in acute distress.    Appearance: He is well-developed.  Eyes:     General: No scleral icterus. Cardiovascular:     Rate and Rhythm: Normal rate and regular rhythm.  Pulmonary:     Effort: Pulmonary effort is normal.     Breath sounds: Normal breath sounds.  Skin:    Coloration: Skin is not pale.  Neurological:     Mental Status: He is alert.       Assessment & Plan:   Problem List Items Addressed This Visit      Respiratory   Asthma, chronic (Chronic)    Refills provided at his request      Relevant Medications   budesonide-formoterol (SYMBICORT) 160-4.5 MCG/ACT inhaler   albuterol (PROVENTIL HFA;VENTOLIN HFA) 108 (90 Base) MCG/ACT inhaler     Other   Bradycardia    Already worked up by cardiologist; no new concerns       Other Visit Diagnoses    Change in voice    -  Primary   Relevant Orders   Ambulatory referral to ENT       Follow up plan: Return in about 8 weeks (around 11/24/2018) for complete physical.  An after-visit summary was printed and given to the patient at [place].  Please see the patient instructions which may contain other information and recommendations beyond what is mentioned above in the assessment and plan.  Meds ordered this encounter  Medications  . budesonide-formoterol (SYMBICORT) 160-4.5  MCG/ACT inhaler    Sig: Inhale 2 puffs into the lungs 2 (two) times daily.    Dispense:  3 Inhaler    Refill:  3    We're increasing the strength  . albuterol (PROVENTIL HFA;VENTOLIN HFA) 108 (90 Base) MCG/ACT inhaler    Sig: Inhale 1-2 puffs into the lungs every 6 (six) hours as needed for wheezing or shortness of breath.    Dispense:  1 Inhaler    Refill:  3    Orders Placed This Encounter  Procedures  . Ambulatory referral to ENT

## 2018-09-29 NOTE — Patient Instructions (Addendum)
I do encourage you to quit smoking Call [phone] to sign up for smoking cessation classes You can call 1-800-QUIT-NOW to talk with a smoking cessation coach  Continue the acid reflux medicine  We'll have you see the [facility] doctor

## 2018-11-10 ENCOUNTER — Ambulatory Visit (INDEPENDENT_AMBULATORY_CARE_PROVIDER_SITE_OTHER): Payer: 59 | Admitting: Gastroenterology

## 2018-11-10 ENCOUNTER — Encounter: Payer: Self-pay | Admitting: Gastroenterology

## 2018-11-10 VITALS — BP 106/68 | HR 63 | Resp 16 | Ht 73.0 in | Wt 174.2 lb

## 2018-11-10 DIAGNOSIS — K648 Other hemorrhoids: Secondary | ICD-10-CM | POA: Diagnosis not present

## 2018-11-10 NOTE — Progress Notes (Signed)
   Cephas Darby, MD [address]  [place]  [place], [place] [address]  Main: [phone]  Fax: [phone] Pager: [phone]   Primary Care Physician: Arnetha Courser, MD  Primary Gastroenterologist:  Dr. Jonathon Bellows  Chief Complaint  Patient presents with  . Follow-up    Banding #1    HPI: Douglas Gair Alston Berrie. is a 53 y.o. male painless rectal bleeding from internal hemorrhoids and referred here for hemorrhoid ligation.  Patient had a colonoscopy by Dr. Vicente Males in 04/2018 which revealed tiny tubular adenoma only and internal hemorrhoids.  He has persistent bright red blood per rectum.  Patient acknowledges that he spends 10 to 15 minutes on toilet during a bowel movement.  He denies any other hemorrhoidal symptoms  Current Outpatient Medications  Medication Sig Dispense Refill  . albuterol (PROVENTIL HFA;VENTOLIN HFA) 108 (90 Base) MCG/ACT inhaler Inhale 1-2 puffs into the lungs every 6 (six) hours as needed for wheezing or shortness of breath. 1 Inhaler 3  . budesonide-formoterol (SYMBICORT) 160-4.5 MCG/ACT inhaler Inhale 2 puffs into the lungs 2 (two) times daily. 3 Inhaler 3  . ketoconazole (NIZORAL) 2 % cream Apply 1 application topically daily. 30 g 0  . mometasone (NASONEX) 50 MCG/ACT nasal spray Place 2 sprays into the nose daily. 51 g 3  . montelukast (SINGULAIR) 10 MG tablet Take 1 tablet (10 mg total) by mouth at bedtime. 90 tablet 3  . omeprazole (PRILOSEC) 20 MG capsule Take 1 capsule (20 mg total) by mouth 2 (two) times daily. 180 capsule 1   No current facility-administered medications for this visit.     Allergies as of 11/10/2018 - Review Complete 11/10/2018  Allergen Reaction Noted  . Shellfish allergy Swelling 03/04/2017  . Peanut-containing drug products  09/24/2018   ROS:  General: Negative for anorexia, weight loss, fever, chills, fatigue, weakness. ENT: Negative for hoarseness, difficulty swallowing , nasal congestion. CV: Negative for chest  pain, angina, palpitations, dyspnea on exertion, peripheral edema.  Respiratory: Negative for dyspnea at rest, dyspnea on exertion, cough, sputum, wheezing.  GI: See history of present illness. GU:  Negative for dysuria, hematuria, urinary incontinence, urinary frequency, nocturnal urination.  Endo: Negative for unusual weight change.    Physical Examination:   BP 106/68 (BP Location: Left Arm, Patient Position: Sitting, Cuff Size: Normal)   Pulse 63   Resp 16   Ht 6\' 1"  (1.854 m)   Wt 174 lb 3.2 oz (79 kg)   BMI 22.98 kg/m   General: Well-nourished, well-developed in no acute distress.  Eyes: No icterus. Conjunctivae pink. Mouth: Oropharyngeal mucosa moist and pink , no lesions erythema or exudate. Lungs: Clear to auscultation bilaterally. Non-labored. Heart: Regular rate and rhythm, no murmurs rubs or gallops.  Abdomen: Bowel sounds are normal, nontender, nondistended, no hepatosplenomegaly or masses, no hernia , no rebound or guarding.   Extremities: No lower extremity edema. No clubbing or deformities. Neuro: Alert and oriented x 3.  Grossly intact. Skin: Warm and dry, no jaundice.   Psych: Alert and cooperative, normal mood and affect.   Imaging Studies: No results found.  Assessment and Plan:   Douglas Trefz. is a 53 y.o. male grade 1 internal hemorrhoids resulting in rectal bleeding Discussed with patient about risks and benefits of outpatient hemorrhoid ligation Patient is agreeable, Consent obtained Proceed with hemorrhoid ligation today  Follow up in 2 weeks   Dr Sherri Sear, MD

## 2018-11-11 NOTE — Progress Notes (Signed)

## 2018-11-25 ENCOUNTER — Ambulatory Visit
Admission: RE | Admit: 2018-11-25 | Discharge: 2018-11-25 | Disposition: A | Discharge status: Home / Self Care | Payer: 59 | Source: Ambulatory Visit | Attending: Family Medicine | Admitting: Family Medicine

## 2018-11-25 ENCOUNTER — Encounter: Payer: Self-pay | Admitting: Family Medicine

## 2018-11-25 ENCOUNTER — Ambulatory Visit (INDEPENDENT_AMBULATORY_CARE_PROVIDER_SITE_OTHER): Payer: 59 | Admitting: Family Medicine

## 2018-11-25 ENCOUNTER — Ambulatory Visit
Admission: RE | Admit: 2018-11-25 | Discharge: 2018-11-25 | Disposition: A | Discharge status: Home / Self Care | Payer: 59 | Attending: Family Medicine | Admitting: Family Medicine

## 2018-11-25 VITALS — BP 110/78 | HR 66 | Temp 97.8°F | Ht 74.0 in | Wt 173.0 lb

## 2018-11-25 DIAGNOSIS — Z125 Encounter for screening for malignant neoplasm of prostate: Secondary | ICD-10-CM | POA: Diagnosis not present

## 2018-11-25 DIAGNOSIS — Z0000 Encounter for general adult medical examination without abnormal findings: Secondary | ICD-10-CM

## 2018-11-25 DIAGNOSIS — Z87891 Personal history of nicotine dependence: Secondary | ICD-10-CM

## 2018-11-25 DIAGNOSIS — E559 Vitamin D deficiency, unspecified: Secondary | ICD-10-CM

## 2018-11-25 DIAGNOSIS — D485 Neoplasm of uncertain behavior of skin: Secondary | ICD-10-CM

## 2018-11-25 DIAGNOSIS — Z0184 Encounter for antibody response examination: Secondary | ICD-10-CM | POA: Diagnosis not present

## 2018-11-25 DIAGNOSIS — Z114 Encounter for screening for human immunodeficiency virus [HIV]: Secondary | ICD-10-CM

## 2018-11-25 NOTE — Patient Instructions (Addendum)
We'll get labs today If you have not heard anything from my staff in a week about any orders/referrals/studies from today, please contact us here to follow-up [phone] You can have the chest xray across the street  Health Maintenance, Male A healthy lifestyle and preventive care is important for your health and wellness. Ask your health care provider about what schedule of regular examinations is right for you. What should I know about weight and diet? Eat a Healthy Diet  Eat plenty of vegetables, fruits, whole grains, low-fat dairy products, and lean protein.  Do not eat a lot of foods high in solid fats, added sugars, or salt.  Maintain a Healthy Weight Regular exercise can help you achieve or maintain a healthy weight. You should:  Do at least 150 minutes of exercise each week. The exercise should increase your heart rate and make you sweat (moderate-intensity exercise).  Do strength-training exercises at least twice a week. Watch Your Levels of Cholesterol and Blood Lipids  Have your blood tested for lipids and cholesterol every 5 years starting at 52 years of age. If you are at high risk for heart disease, you should start having your blood tested when you are 52 years old. You may need to have your cholesterol levels checked more often if: ? Your lipid or cholesterol levels are high. ? You are older than 52 years of age. ? You are at high risk for heart disease. What should I know about cancer screening? Many types of cancers can be detected early and may often be prevented. Lung Cancer  You should be screened every year for lung cancer if: ? You are a current smoker who has smoked for at least 30 years. ? You are a former smoker who has quit within the past 15 years.  Talk to your health care provider about your screening options, when you should start screening, and how often you should be screened. Colorectal Cancer  Routine colorectal cancer screening usually  begins at 52 years of age and should be repeated every 5-10 years until you are 52 years old. You may need to be screened more often if early forms of precancerous polyps or small growths are found. Your health care provider may recommend screening at an earlier age if you have risk factors for colon cancer.  Your health care provider may recommend using home test kits to check for hidden blood in the stool.  A small camera at the end of a tube can be used to examine your colon (sigmoidoscopy or colonoscopy). This checks for the earliest forms of colorectal cancer. Prostate and Testicular Cancer  Depending on your age and overall health, your health care provider may do certain tests to screen for prostate and testicular cancer.  Talk to your health care provider about any symptoms or concerns you have about testicular or prostate cancer. Skin Cancer  Check your skin from head to toe regularly.  Tell your health care provider about any new moles or changes in moles, especially if: ? There is a change in a mole's size, shape, or color. ? You have a mole that is larger than a pencil eraser.  Always use sunscreen. Apply sunscreen liberally and repeat throughout the day.  Protect yourself by wearing long sleeves, pants, a wide-brimmed hat, and sunglasses when outside. What should I know about heart disease, diabetes, and high blood pressure?  If you are 66-65 years of age, have your blood pressure checked every 3-5 years. If you  are 65 years of age or older, have your blood pressure checked every year. You should have your blood pressure measured twice-once when you are at a hospital or clinic, and once when you are not at a hospital or clinic. Record the average of the two measurements. To check your blood pressure when you are not at a hospital or clinic, you can use: ? An automated blood pressure machine at a pharmacy. ? A home blood pressure monitor.  Talk to your health care provider  about your target blood pressure.  If you are between 39-65 years old, ask your health care provider if you should take aspirin to prevent heart disease.  Have regular diabetes screenings by checking your fasting blood sugar level. ? If you are at a normal weight and have a low risk for diabetes, have this test once every three years after the age of 41. ? If you are overweight and have a high risk for diabetes, consider being tested at a younger age or more often.  A one-time screening for abdominal aortic aneurysm (AAA) by ultrasound is recommended for men aged 12-75 years who are current or former smokers. What should I know about preventing infection? Hepatitis B If you have a higher risk for hepatitis B, you should be screened for this virus. Talk with your health care provider to find out if you are at risk for hepatitis B infection. Hepatitis C Blood testing is recommended for:  Everyone born from [birth year] through 1964.  Anyone with known risk factors for hepatitis C. Sexually Transmitted Diseases (STDs)  You should be screened each year for STDs including gonorrhea and chlamydia if: ? You are sexually active and are younger than 52 years of age. ? You are older than 52 years of age and your health care provider tells you that you are at risk for this type of infection. ? Your sexual activity has changed since you were last screened and you are at an increased risk for chlamydia or gonorrhea. Ask your health care provider if you are at risk.  Talk with your health care provider about whether you are at high risk of being infected with HIV. Your health care provider may recommend a prescription medicine to help prevent HIV infection. What else can I do?  Schedule regular health, dental, and eye exams.  Stay current with your vaccines (immunizations).  Do not use any tobacco products, such as cigarettes, chewing tobacco, and e-cigarettes. If you need help quitting, ask your health  care provider.  Limit alcohol intake to no more than 2 drinks per day. One drink equals 12 ounces of beer, 5 ounces of wine, or 1 ounces of hard liquor.  Do not use street drugs.  Do not share needles.  Ask your health care provider for help if you need support or information about quitting drugs.  Tell your health care provider if you often feel depressed.  Tell your health care provider if you have ever been abused or do not feel safe at home. This information is not intended to replace advice given to you by your health care provider. Make sure you discuss any questions you have with your health care provider. Document Released: 03/28/2008 Document Revised: 05/29/2016 Document Reviewed: 07/04/2015 Elsevier Interactive Patient Education  2019 [employer].

## 2018-11-25 NOTE — Progress Notes (Signed)
BP 110/78   Pulse 66   Temp 97.8 F (36.6 C)   Ht 6\' 2"  (1.88 m)   Wt 173 lb (78.5 kg)   SpO2 96%   BMI 22.21 kg/m    Subjective:    Patient ID: Douglas Abbott., male    DOB: [birth year], 53 y.o.   MRN: [id number]  HPI: Nakota Elsen. is a 53 y.o. male  Chief Complaint  Patient presents with  . Annual Exam    HPI He has the hemorrhoid banding; painful with passing BM the next day; excruciating, about to pass out; broke out in a sweat; wife helped him up and got him to bed; hurt for 3-4 BMs and then it eased up; then 2 days ago he saw some more blood, not worried about it; saw a little clot when wiping; he will go back  USPSTF grade A and B recommendations Depression:  Depression screen [facility] 2/9 11/25/2018 09/29/2018 06/29/2018 06/29/2018 03/27/2018  Decreased Interest 0 0 0 0 0  Down, Depressed, Hopeless 0 0 0 0 0  PHQ - 2 Score 0 0 0 0 0  Altered sleeping 0 0 0 - -  Tired, decreased energy 0 0 0 - -  Change in appetite 0 0 0 - -  Feeling bad or failure about yourself  0 0 0 - -  Trouble concentrating 0 0 0 - -  Moving slowly or fidgety/restless 0 0 0 - -  Suicidal thoughts 0 0 0 - -  PHQ-9 Score 0 0 0 - -  Difficult doing work/chores Not difficult at all Not difficult at all Not difficult at all - -   Hypertension: BP Readings from Last 3 Encounters:  11/25/18 110/78  11/10/18 106/68  09/29/18 110/74   Obesity: Wt Readings from Last 3 Encounters:  11/25/18 173 lb (78.5 kg)  11/10/18 174 lb 3.2 oz (79 kg)  09/29/18 173 lb 4.8 oz (78.6 kg)   BMI Readings from Last 3 Encounters:  11/25/18 22.21 kg/m  11/10/18 22.98 kg/m  09/29/18 22.86 kg/m    Immunizations: he'll consider the shingrix; never had chicken pox; all other UTD  Skin cancer: mole on the right neck, base of left thumb, all his life; new moles popping up Lung cancer:  "I'm good" still occasionally marijuana, no cigarettes Prostate cancer: occasional weak stream; right now it's  okay  Lab Results  Component Value Date   PSA 1.3 11/25/2018   PSA 1.3 02/27/2017   Colorectal cancer: UTD; five years AAA: n/a Aspirin: not taking aspirin, just when headache The 10-year ASCVD risk score Mikey Bussing DC Jr., et al., 2013) is: 3.9%   Values used to calculate the score:     Age: 51 years     Sex: Male     Is Non-Hispanic African American: Yes     Diabetic: No     Tobacco smoker: No     Systolic Blood Pressure: 269 mmHg     Is BP treated: No     HDL Cholesterol: 63 mg/dL     Total Cholesterol: 192 mg/dL Diet: just got into eating greens, not every day though; loves sweets Exercise: just got a FitBit, every day he is over 22k steps a day; some lifting and pulling and pushing Alcohol:    Office Visit from 11/25/2018 in [facility]  AUDIT-C Score  0     Tobacco use: quit in 2000 HIV, hep B, hep C: okay STD testing and  prevention (chl/gon/syphilis): declined Glucose:  Glucose, Bld  Date Value Ref Range Status  11/25/2018 89 65 - 99 mg/dL Final    Comment:    .            Fasting reference interval .   06/29/2018 96 65 - 99 mg/dL Final    Comment:    .            Fasting reference interval .   02/27/2017 83 65 - 99 mg/dL Final   Lipids:  Lab Results  Component Value Date   CHOL 192 11/25/2018   CHOL 187 06/29/2018   CHOL 177 02/27/2017   Lab Results  Component Value Date   HDL 63 11/25/2018   HDL 67 06/29/2018   HDL 75 02/27/2017   Lab Results  Component Value Date   LDLCALC 115 (H) 11/25/2018   LDLCALC 108 (H) 06/29/2018   LDLCALC 92 02/27/2017   Lab Results  Component Value Date   TRIG 46 11/25/2018   TRIG 41 06/29/2018   TRIG 49 02/27/2017   Lab Results  Component Value Date   CHOLHDL 3.0 11/25/2018   CHOLHDL 2.8 06/29/2018   CHOLHDL 2.4 02/27/2017   No results found for: LDLDIRECT   Depression screen [facility] 2/9 11/25/2018 09/29/2018 06/29/2018 06/29/2018 03/27/2018  Decreased Interest 0 0 0 0 0  Down, Depressed,  Hopeless 0 0 0 0 0  PHQ - 2 Score 0 0 0 0 0  Altered sleeping 0 0 0 - -  Tired, decreased energy 0 0 0 - -  Change in appetite 0 0 0 - -  Feeling bad or failure about yourself  0 0 0 - -  Trouble concentrating 0 0 0 - -  Moving slowly or fidgety/restless 0 0 0 - -  Suicidal thoughts 0 0 0 - -  PHQ-9 Score 0 0 0 - -  Difficult doing work/chores Not difficult at all Not difficult at all Not difficult at all - -   Fall Risk  11/25/2018 09/29/2018 06/29/2018 03/27/2018 09/16/2017  Falls in the past year? 0 0 No No No  Number falls in past yr: - 0 - - -  Injury with Fall? - 0 - - -    Relevant past medical, surgical, family and social history reviewed Past Medical History:  Diagnosis Date  . Allergic rhinitis   . Allergy   . Asthma   . Decreased libido   . History of pleurisy   . Sinus bradycardia    a. 03/2017 Echo: Ef 50-55%.   Past Surgical History:  Procedure Laterality Date  . COLONOSCOPY WITH PROPOFOL N/A 05/04/2018   Procedure: COLONOSCOPY WITH PROPOFOL;  Surgeon: Jonathon Bellows, MD;  Location: [facility] ENDOSCOPY;  Service: Gastroenterology;  Laterality: N/A;  . NO PAST SURGERIES     Family History  Problem Relation Age of Onset  . Asthma Mother   . Lung cancer Mother   . Kidney disease Mother   . Heart disease Neg Hx    Social History   Tobacco Use  . Smoking status: Former Smoker    Packs/day: 1.00    Years: 8.00    Pack years: 8.00    Types: Cigarettes    Last attempt to quit: 2000    Years since quitting: 20.1  . Smokeless tobacco: Never Used  Substance Use Topics  . Alcohol use: No    Alcohol/week: 0.0 standard drinks    Frequency: Never  . Drug use: Yes    Frequency:  1.0 times per week    Types: Marijuana    Comment: occasional marijuana use     Office Visit from 11/25/2018 in [facility]  AUDIT-C Score  0      Interim medical history since last visit reviewed. Allergies and medications reviewed  Review of Systems  Constitutional:  Negative for unexpected weight change.  HENT: Negative for hearing loss.   Respiratory: Negative for wheezing.   Cardiovascular: Negative for chest pain.  Gastrointestinal: Positive for blood in stool (hemorrhoids).  Genitourinary: Negative for dysuria.  Skin:       New moles  Allergic/Immunologic: Positive for food allergies (fish from [place], allergic to shellfish; knot on the right side jaw, went away; doesn't eat a lot; has an epi pen).  Hematological: Negative for adenopathy. Does not bruise/bleed easily.  Psychiatric/Behavioral: Negative for dysphoric mood.   Per HPI unless specifically indicated above     Objective:    BP 110/78   Pulse 66   Temp 97.8 F (36.6 C)   Ht 6\' 2"  (1.88 m)   Wt 173 lb (78.5 kg)   SpO2 96%   BMI 22.21 kg/m   Wt Readings from Last 3 Encounters:  11/25/18 173 lb (78.5 kg)  11/10/18 174 lb 3.2 oz (79 kg)  09/29/18 173 lb 4.8 oz (78.6 kg)    Physical Exam Constitutional:      General: He is not in acute distress.    Appearance: He is well-developed. He is not diaphoretic.  HENT:     Head: Normocephalic and atraumatic.     Nose: Nose normal.  Eyes:     General: No scleral icterus. Neck:     Thyroid: No thyromegaly.     Vascular: No JVD.  Cardiovascular:     Rate and Rhythm: Normal rate and regular rhythm.     Heart sounds: Normal heart sounds.  Pulmonary:     Effort: Pulmonary effort is normal. No respiratory distress.     Breath sounds: Normal breath sounds. No wheezing or rales.  Abdominal:     General: Bowel sounds are normal. There is no distension.     Palpations: Abdomen is soft.     Tenderness: There is no abdominal tenderness. There is no guarding.  Musculoskeletal: Normal range of motion.  Lymphadenopathy:     Cervical: No cervical adenopathy.  Skin:    General: Skin is warm and dry.     Coloration: Skin is not pale.     Findings: No erythema or rash.     Comments: Few dark brown macular nevi on skin  Neurological:      Mental Status: He is alert.     Motor: No abnormal muscle tone.     Coordination: Coordination normal.     Deep Tendon Reflexes: Reflexes normal.  Psychiatric:        Mood and Affect: Mood is not anxious or depressed.        Behavior: Behavior normal.        Thought Content: Thought content normal.        Judgment: Judgment normal.       Assessment & Plan:   Problem List Items Addressed This Visit      Other   Vitamin D deficiency   Relevant Orders   VITAMIN D 25 Hydroxy (Vit-D Deficiency, Fractures) (Completed)    Other Visit Diagnoses    Neoplasm of uncertain behavior of skin    -  Primary   Relevant Orders  Ambulatory referral to Dermatology   Immunity to varicella determined by serologic test       Relevant Orders   Varicella zoster antibody, IgG (Completed)   History of tobacco use       Relevant Orders   DG Chest 2 View (Completed)   Preventative health care       Relevant Orders   CBC (Completed)   COMPLETE METABOLIC PANEL WITH GFR (Completed)   Lipid panel (Completed)   TSH (Completed)   Screening for prostate cancer       Relevant Orders   PSA (Completed)   Encounter for screening for HIV       Relevant Orders   HIV Antibody (routine testing w rflx) (Completed)       Follow up plan: Return in about 1 year (around 11/26/2019) for complete physical (or just after).  An after-visit summary was printed and given to the patient at [place].  Please see the patient instructions which may contain other information and recommendations beyond what is mentioned above in the assessment and plan.  No orders of the defined types were placed in this encounter.   Orders Placed This Encounter  Procedures  . DG Chest 2 View  . Varicella zoster antibody, IgG  . PSA  . CBC  . COMPLETE METABOLIC PANEL WITH GFR  . Lipid panel  . TSH  . HIV Antibody (routine testing w rflx)  . VITAMIN D 25 Hydroxy (Vit-D Deficiency, Fractures)  . Ambulatory referral to  Dermatology

## 2018-11-26 ENCOUNTER — Other Ambulatory Visit: Payer: Self-pay | Admitting: Family Medicine

## 2018-11-26 DIAGNOSIS — Z0000 Encounter for general adult medical examination without abnormal findings: Secondary | ICD-10-CM | POA: Insufficient documentation

## 2018-11-26 LAB — LIPID PANEL
Cholesterol: 192 mg/dL (ref <200)
HDL: 63 mg/dL (ref >=40)
LDL CHOLESTEROL (CALC): 115 mg/dL — AB
NON-HDL CHOLESTEROL (CALC): 129 mg/dL (ref <130)
Total CHOL/HDL Ratio: 3 (calc) (ref <5.0)
Triglycerides: 46 mg/dL (ref <150)

## 2018-11-26 LAB — CBC
HCT: 39.7 % (ref 38.5–50.0)
Hemoglobin: 13.2 g/dL (ref 13.2–17.1)
MCH: 29.5 pg (ref 27.0–33.0)
MCHC: 33.2 g/dL (ref 32.0–36.0)
MCV: 88.8 fL (ref 80.0–100.0)
MPV: 9.7 fL (ref 7.5–12.5)
PLATELETS: 304 10*3/uL (ref 140–400)
RBC: 4.47 10*6/uL (ref 4.20–5.80)
RDW: 15 % (ref 11.0–15.0)
WBC: 4.2 10*3/uL (ref 3.8–10.8)

## 2018-11-26 LAB — HIV ANTIBODY (ROUTINE TESTING W REFLEX): HIV 1&2 Ab, 4th Generation: NONREACTIVE

## 2018-11-26 LAB — COMPLETE METABOLIC PANEL WITH GFR
AG RATIO: 2 (calc) (ref 1.0–2.5)
ALBUMIN MSPROF: 4.1 g/dL (ref 3.6–5.1)
ALT: 17 U/L (ref 9–46)
AST: 17 U/L (ref 10–35)
Alkaline phosphatase (APISO): 109 U/L (ref 35–144)
BILIRUBIN TOTAL: 0.5 mg/dL (ref 0.2–1.2)
BUN: 17 mg/dL (ref 7–25)
CALCIUM: 9.2 mg/dL (ref 8.6–10.3)
CO2: 25 mmol/L (ref 20–32)
Chloride: 108 mmol/L (ref 98–110)
Creat: 0.99 mg/dL (ref 0.70–1.33)
GFR, EST NON AFRICAN AMERICAN: 88 mL/min/{1.73_m2} (ref >=60)
GFR, Est African American: 102 mL/min/{1.73_m2} (ref >=60)
Globulin: 2.1 g/dL (calc) (ref 1.9–3.7)
Glucose, Bld: 89 mg/dL (ref 65–99)
POTASSIUM: 4.1 mmol/L (ref 3.5–5.3)
Sodium: 140 mmol/L (ref 135–146)
TOTAL PROTEIN: 6.2 g/dL (ref 6.1–8.1)

## 2018-11-26 LAB — TSH: TSH: 0.92 mIU/L (ref 0.40–4.50)

## 2018-11-26 LAB — VARICELLA ZOSTER ANTIBODY, IGG: VARICELLA IGG: 1052 {index}

## 2018-11-26 LAB — VITAMIN D 25 HYDROXY (VIT D DEFICIENCY, FRACTURES): Vit D, 25-Hydroxy: 18 ng/mL — ABNORMAL LOW (ref 30–100)

## 2018-11-26 LAB — PSA: PSA: 1.3 ng/mL (ref <=4.0)

## 2018-11-26 MED ORDER — VITAMIN D (ERGOCALCIFEROL) 1.25 MG (50000 UNIT) PO CAPS: 50000.0000 [IU] | ORAL_CAPSULE | ORAL | 1 refills | Status: DC

## 2018-11-26 NOTE — Assessment & Plan Note (Signed)
USPSTF grade A and B recommendations reviewed with patient; age-appropriate recommendations, preventive care, screening tests, etc discussed and encouraged; healthy living encouraged; see AVS for patient education given to patient  

## 2018-11-26 NOTE — Progress Notes (Signed)
Vit D Rx weekly x 8 weeks Then, 800 to 1000 iu vit D3 daily (when not getting much sunshine)

## 2018-11-30 ENCOUNTER — Ambulatory Visit (INDEPENDENT_AMBULATORY_CARE_PROVIDER_SITE_OTHER): Payer: 59 | Admitting: Gastroenterology

## 2018-11-30 ENCOUNTER — Encounter: Payer: Self-pay | Admitting: Gastroenterology

## 2018-11-30 VITALS — BP 132/90 | HR 84 | Resp 17 | Ht 74.0 in | Wt 175.2 lb

## 2018-11-30 DIAGNOSIS — K640 First degree hemorrhoids: Secondary | ICD-10-CM | POA: Diagnosis not present

## 2018-11-30 NOTE — Progress Notes (Signed)

## 2018-12-28 ENCOUNTER — Ambulatory Visit: Payer: 59 | Admitting: Nurse Practitioner

## 2018-12-29 ENCOUNTER — Other Ambulatory Visit: Payer: Self-pay

## 2018-12-29 ENCOUNTER — Encounter: Payer: Self-pay | Admitting: Gastroenterology

## 2018-12-29 ENCOUNTER — Ambulatory Visit (INDEPENDENT_AMBULATORY_CARE_PROVIDER_SITE_OTHER): Payer: 59 | Admitting: Gastroenterology

## 2018-12-29 VITALS — BP 122/79 | Ht 74.0 in | Wt 172.8 lb

## 2018-12-29 DIAGNOSIS — K640 First degree hemorrhoids: Secondary | ICD-10-CM

## 2018-12-29 NOTE — Progress Notes (Signed)

## 2018-12-30 DIAGNOSIS — K649 Unspecified hemorrhoids: Secondary | ICD-10-CM | POA: Insufficient documentation

## 2018-12-30 NOTE — Progress Notes (Deleted)
Name: Douglas Abbott.   MRN: [id number]    DOB: [birth year]   Date:12/30/2018       Progress Note  Subjective  Chief Complaint  No chief complaint on file.   HPI  Asthma  Patient is prescribed Symbicort 2 puffs daily and albuterol as needed  Allergic Rhinitis Patient is prescribed Singulair and nasonex  GERD Patient is prescribed omeprazole   Vitamin D deficiency Taking Vitamin D supplementation   Hemorrhoids Patient had hemorrhoids that were banded yesterday  Hyperlipidemia Noted at recent physical instructed on healthy diet.  Lab Results  Component Value Date   CHOL 192 11/25/2018   HDL 63 11/25/2018   LDLCALC 115 (H) 11/25/2018   TRIG 46 11/25/2018   CHOLHDL 3.0 11/25/2018    PHQ2/9: Depression screen [facility] 2/9 11/25/2018 09/29/2018 06/29/2018 06/29/2018 03/27/2018  Decreased Interest 0 0 0 0 0  Down, Depressed, Hopeless 0 0 0 0 0  PHQ - 2 Score 0 0 0 0 0  Altered sleeping 0 0 0 - -  Tired, decreased energy 0 0 0 - -  Change in appetite 0 0 0 - -  Feeling bad or failure about yourself  0 0 0 - -  Trouble concentrating 0 0 0 - -  Moving slowly or fidgety/restless 0 0 0 - -  Suicidal thoughts 0 0 0 - -  PHQ-9 Score 0 0 0 - -  Difficult doing work/chores Not difficult at all Not difficult at all Not difficult at all - -   ***  PHQ reviewed. {Pos/neg/not test:140017::"Negative"}  Patient Active Problem List   Diagnosis Date Noted  . Preventative health care 11/26/2018  . Bradycardia 09/29/2018  . Borderline high cholesterol 06/29/2018  . Vitamin D deficiency 06/29/2018  . Need for pneumococcal vaccination 03/27/2018  . BRBPR (bright red blood per rectum) 03/27/2018  . Right shoulder pain 09/16/2017  . GERD (gastroesophageal reflux disease) 01/02/2017  . Blood in the urine 09/11/2015  . Asthma, chronic 03/09/2015  . Allergic rhinitis 03/09/2015    Past Medical History:  Diagnosis Date  . Allergic rhinitis   . Allergy   . Asthma   . Decreased  libido   . History of pleurisy   . Sinus bradycardia    a. 03/2017 Echo: Ef 50-55%.    Past Surgical History:  Procedure Laterality Date  . COLONOSCOPY WITH PROPOFOL N/A 05/04/2018   Procedure: COLONOSCOPY WITH PROPOFOL;  Surgeon: Jonathon Bellows, MD;  Location: [facility] ENDOSCOPY;  Service: Gastroenterology;  Laterality: N/A;  . NO PAST SURGERIES      Social History   Tobacco Use  . Smoking status: Former Smoker    Packs/day: 1.00    Years: 8.00    Pack years: 8.00    Types: Cigarettes    Last attempt to quit: 2000    Years since quitting: 20.2  . Smokeless tobacco: Never Used  Substance Use Topics  . Alcohol use: No    Alcohol/week: 0.0 standard drinks    Frequency: Never     Current Outpatient Medications:  .  albuterol (PROVENTIL HFA;VENTOLIN HFA) 108 (90 Base) MCG/ACT inhaler, Inhale 1-2 puffs into the lungs every 6 (six) hours as needed for wheezing or shortness of breath., Disp: 1 Inhaler, Rfl: 3 .  budesonide-formoterol (SYMBICORT) 160-4.5 MCG/ACT inhaler, Inhale 2 puffs into the lungs 2 (two) times daily., Disp: 3 Inhaler, Rfl: 3 .  ketoconazole (NIZORAL) 2 % cream, Apply 1 application topically daily., Disp: 30 g, Rfl: 0 .  mometasone (  NASONEX) 50 MCG/ACT nasal spray, Place 2 sprays into the nose daily., Disp: 51 g, Rfl: 3 .  montelukast (SINGULAIR) 10 MG tablet, Take 1 tablet (10 mg total) by mouth at bedtime., Disp: 90 tablet, Rfl: 3 .  omeprazole (PRILOSEC) 20 MG capsule, Take 1 capsule (20 mg total) by mouth 2 (two) times daily., Disp: 180 capsule, Rfl: 1 .  Vitamin D, Ergocalciferol, (DRISDOL) 1.25 MG (50000 UT) CAPS capsule, Take 1 capsule (50,000 Units total) by mouth every 7 (seven) days., Disp: 4 capsule, Rfl: 1  Allergies  Allergen Reactions  . Shellfish Allergy Swelling  . Peanut-Containing Drug Products     ROS  ***  No other specific complaints in a complete review of systems (except as listed in HPI above).  Objective  There were no vitals filed for  this visit. ***  There is no height or weight on file to calculate BMI.  Nursing Note and Vital Signs reviewed.  Physical Exam  ***   No results found for this or any previous visit (from the past 48 hour(s)).  Assessment & Plan  There are no diagnoses linked to this encounter.   -Red flags and when to present for emergency care or RTC including fever >101.36F, chest pain, shortness of breath, new/worsening/un-resolving symptoms, *** reviewed with patient at time of visit. Follow up and care instructions discussed and provided in AVS. -Reviewed Health Maintenance: ***

## 2018-12-31 ENCOUNTER — Ambulatory Visit: Payer: 59 | Admitting: Nurse Practitioner

## 2019-01-02 ENCOUNTER — Encounter: Payer: Self-pay | Admitting: Family Medicine

## 2019-01-04 ENCOUNTER — Telehealth: Payer: Self-pay | Admitting: Family Medicine

## 2019-01-04 NOTE — Telephone Encounter (Signed)
Copied from [place] [phone]. Topic: Quick Communication - See Telephone Encounter >> Jan 04, 2019  8:34 AM Blase Mess A wrote: CRM for notification. See Telephone encounter for: 01/04/19.  Patient is calling because he is concerned about being at work. The patient has asthma and is requesting advice from Dr. Sanda Klein on weather he should be at work or not. Please advise. Thank you [phone] left vm

## 2019-01-04 NOTE — Telephone Encounter (Signed)
We cannot provide work notes for patients to be out unless they are symptomatic, have traveled to an endemic area, or have had known contact with a COVID19 positive patient.  Please see below and discuss high risk vs low risk and associated symptoms.  If he is feeling ill in any way, he needs to call our office for screening and/or schedule an evisit.

## 2019-01-04 NOTE — Telephone Encounter (Signed)
Patient notified

## 2019-01-05 ENCOUNTER — Encounter: Payer: Self-pay | Admitting: Nurse Practitioner

## 2019-01-05 ENCOUNTER — Ambulatory Visit (INDEPENDENT_AMBULATORY_CARE_PROVIDER_SITE_OTHER): Payer: 59 | Admitting: Nurse Practitioner

## 2019-01-05 ENCOUNTER — Other Ambulatory Visit: Payer: Self-pay

## 2019-01-05 VITALS — BP 120/76 | HR 65 | Temp 98.2°F | Resp 12 | Ht 74.0 in | Wt 175.7 lb

## 2019-01-05 DIAGNOSIS — K219 Gastro-esophageal reflux disease without esophagitis: Secondary | ICD-10-CM

## 2019-01-05 DIAGNOSIS — J309 Allergic rhinitis, unspecified: Secondary | ICD-10-CM

## 2019-01-05 DIAGNOSIS — E7841 Elevated Lipoprotein(a): Secondary | ICD-10-CM | POA: Diagnosis not present

## 2019-01-05 DIAGNOSIS — E559 Vitamin D deficiency, unspecified: Secondary | ICD-10-CM | POA: Diagnosis not present

## 2019-01-05 DIAGNOSIS — J4540 Moderate persistent asthma, uncomplicated: Secondary | ICD-10-CM

## 2019-01-05 DIAGNOSIS — J4530 Mild persistent asthma, uncomplicated: Secondary | ICD-10-CM

## 2019-01-05 DIAGNOSIS — B353 Tinea pedis: Secondary | ICD-10-CM

## 2019-01-05 MED ORDER — OMEPRAZOLE 20 MG PO CPDR: 20.0000 mg | DELAYED_RELEASE_CAPSULE | Freq: Two times a day (BID) | ORAL | 1 refills | Status: DC

## 2019-01-05 MED ORDER — BUDESONIDE-FORMOTEROL FUMARATE 160-4.5 MCG/ACT IN AERO: 2.0000 | INHALATION_SPRAY | Freq: Two times a day (BID) | RESPIRATORY_TRACT | 3 refills | Status: DC

## 2019-01-05 MED ORDER — ALBUTEROL SULFATE HFA 108 (90 BASE) MCG/ACT IN AERS: 1.0000 | INHALATION_SPRAY | Freq: Four times a day (QID) | RESPIRATORY_TRACT | 3 refills | Status: DC | PRN

## 2019-01-05 MED ORDER — KETOCONAZOLE 2 % EX CREA: 1.0000 "application " | TOPICAL_CREAM | Freq: Every day | CUTANEOUS | 0 refills | Status: DC

## 2019-01-05 NOTE — Progress Notes (Signed)
Name: Douglas Abbott.   MRN: [id number]    DOB: 06/01/1966   Date:01/05/2019       Progress Note  Subjective  Chief Complaint  Chief Complaint  Patient presents with  . Follow-up    HPI  Vitamin D deficiency: never picked up rx dose, states pharmacy did not alert him, plans to get it now  GERD: takes omeprazole here and there, maybe a few times, doesn't eat spicy foods but does have a good amount of acidic and fatty foods.   Hyperlipidemia: eats some pork but plans to increase vegetables  Lab Results  Component Value Date   CHOL 192 11/25/2018   HDL 63 11/25/2018   LDLCALC 115 (H) 11/25/2018   TRIG 46 11/25/2018   CHOLHDL 3.0 11/25/2018   Asthma: uses Symbicort 2 puffs BID, albuterol as needed- maybe a few times a week. Triggers are work- due to working around [employer].   Allergic rhinitis: takes Singulair nightly when he remembers- discussed black box warning, uses nasonex PRN   Tinea pedis- left foot, pinky toe is itching has used this  Ketoconazole cream with relief of symptoms before.   PHQ2/9: Depression screen [facility] 2/9 01/05/2019 11/25/2018 09/29/2018 06/29/2018 06/29/2018  Decreased Interest 0 0 0 0 0  Down, Depressed, Hopeless 0 0 0 0 0  PHQ - 2 Score 0 0 0 0 0  Altered sleeping 0 0 0 0 -  Tired, decreased energy 0 0 0 0 -  Change in appetite 0 0 0 0 -  Feeling bad or failure about yourself  0 0 0 0 -  Trouble concentrating 0 0 0 0 -  Moving slowly or fidgety/restless 0 0 0 0 -  Suicidal thoughts 0 0 0 0 -  PHQ-9 Score 0 0 0 0 -  Difficult doing work/chores Not difficult at all Not difficult at all Not difficult at all Not difficult at all -     PHQ reviewed. Negative  Patient Active Problem List   Diagnosis Date Noted  . Hemorrhoids 12/30/2018  . Preventative health care 11/26/2018  . Bradycardia 09/29/2018  . Borderline high cholesterol 06/29/2018  . Vitamin D deficiency 06/29/2018  . Need for pneumococcal vaccination 03/27/2018  . BRBPR (bright  red blood per rectum) 03/27/2018  . Right shoulder pain 09/16/2017  . GERD (gastroesophageal reflux disease) 01/02/2017  . Blood in the urine 09/11/2015  . Asthma, chronic 03/09/2015  . Allergic rhinitis 03/09/2015    Past Medical History:  Diagnosis Date  . Allergic rhinitis   . Allergy   . Asthma   . Decreased libido   . History of pleurisy   . Sinus bradycardia    a. 03/2017 Echo: Ef 50-55%.    Past Surgical History:  Procedure Laterality Date  . COLONOSCOPY WITH PROPOFOL N/A 05/04/2018   Procedure: COLONOSCOPY WITH PROPOFOL;  Surgeon: Jonathon Bellows, MD;  Location: [facility] ENDOSCOPY;  Service: Gastroenterology;  Laterality: N/A;  . NO PAST SURGERIES      Social History   Tobacco Use  . Smoking status: Former Smoker    Packs/day: 1.00    Years: 8.00    Pack years: 8.00    Types: Cigarettes    Last attempt to quit: 2000    Years since quitting: 20.2  . Smokeless tobacco: Never Used  Substance Use Topics  . Alcohol use: No    Alcohol/week: 0.0 standard drinks    Frequency: Never     Current Outpatient Medications:  .  albuterol (PROVENTIL  HFA;VENTOLIN HFA) 108 (90 Base) MCG/ACT inhaler, Inhale 1-2 puffs into the lungs every 6 (six) hours as needed for wheezing or shortness of breath., Disp: 1 Inhaler, Rfl: 3 .  budesonide-formoterol (SYMBICORT) 160-4.5 MCG/ACT inhaler, Inhale 2 puffs into the lungs 2 (two) times daily., Disp: 3 Inhaler, Rfl: 3 .  ketoconazole (NIZORAL) 2 % cream, Apply 1 application topically daily., Disp: 30 g, Rfl: 0 .  mometasone (NASONEX) 50 MCG/ACT nasal spray, Place 2 sprays into the nose daily., Disp: 51 g, Rfl: 3 .  montelukast (SINGULAIR) 10 MG tablet, Take 1 tablet (10 mg total) by mouth at bedtime., Disp: 90 tablet, Rfl: 3 .  omeprazole (PRILOSEC) 20 MG capsule, Take 1 capsule (20 mg total) by mouth 2 (two) times daily., Disp: 180 capsule, Rfl: 1 .  Vitamin D, Ergocalciferol, (DRISDOL) 1.25 MG (50000 UT) CAPS capsule, Take 1 capsule (50,000  Units total) by mouth every 7 (seven) days., Disp: 4 capsule, Rfl: 1  Allergies  Allergen Reactions  . Shellfish Allergy Swelling  . Peanut-Containing Drug Products     ROS  No other specific complaints in a complete review of systems (except as listed in HPI above).  Objective  Vitals:   01/05/19 0853  BP: 120/76  Pulse: 65  Resp: 12  Temp: 98.2 F (36.8 C)  SpO2: 99%  Weight: 175 lb 11.2 oz (79.7 kg)  Height: 6\' 2"  (1.88 m)    Body mass index is 22.56 kg/m.  Nursing Note and Vital Signs reviewed.  Physical Exam Vitals signs reviewed.  Constitutional:      Appearance: He is well-developed.  HENT:     Head: Normocephalic and atraumatic.  Neck:     Musculoskeletal: Normal range of motion and neck supple.     Vascular: No carotid bruit.  Cardiovascular:     Heart sounds: Normal heart sounds.  Pulmonary:     Effort: Pulmonary effort is normal.     Breath sounds: Normal breath sounds.  Abdominal:     General: Bowel sounds are normal.     Palpations: Abdomen is soft.     Tenderness: There is no abdominal tenderness.  Musculoskeletal: Normal range of motion.  Skin:    General: Skin is warm and dry.     Capillary Refill: Capillary refill takes less than 2 seconds.  Neurological:     Mental Status: He is alert and oriented to person, place, and time.     GCS: GCS eye subscore is 4. GCS verbal subscore is 5. GCS motor subscore is 6.     Sensory: No sensory deficit.  Psychiatric:        Speech: Speech normal.        Behavior: Behavior normal.        Thought Content: Thought content normal.        Judgment: Judgment normal.        No results found for this or any previous visit (from the past 48 hour(s)).  Assessment & Plan 1. Vitamin D deficiency OTC recommendation after completion of vitamin D rx   2. Allergic rhinitis, unspecified seasonality, unspecified trigger Discussed Singulair black box warning, patient would like to stay on, dissucsed  antihistamine use  3. Elevated lipoprotein(a) Discussed diet  4. Gastroesophageal reflux disease, esophagitis presence not specified - omeprazole (PRILOSEC) 20 MG capsule; Take 1 capsule (20 mg total) by mouth 2 (two) times daily.  Dispense: 180 capsule; Refill: 1  5. Moderate persistent chronic asthma without complication - budesonide-formoterol (SYMBICORT) 160-4.5 MCG/ACT  inhaler; Inhale 2 puffs into the lungs 2 (two) times daily.  Dispense: 3 Inhaler; Refill: 3 - albuterol (PROVENTIL HFA;VENTOLIN HFA) 108 (90 Base) MCG/ACT inhaler; Inhale 1-2 puffs into the lungs every 6 (six) hours as needed for wheezing or shortness of breath.  Dispense: 1 Inhaler; Refill: 3  6. Tinea pedis of left foot - ketoconazole (NIZORAL) 2 % cream; Apply 1 application topically daily.  Dispense: 30 g; Refill: 0  Face-to-face time with patient was more than 25 minutes, >50% time spent counseling and coordination of care Discussed lifestyle modification, patient wanting to try transition to vegetarian diet but plans to just increase vegetables in diet for now

## 2019-01-05 NOTE — Patient Instructions (Addendum)
Bad cholesterol, also called low-density lipoprotein (LDL), carries cholesterol and other fats that your liver makes to your body tissue. If it builds up in blood vessels, LDL can cause heart disease and other health problems. Your LDL level should be below 100. If you have diabetes or a possible heart problem, your LDL should be below 70.  Eat: Eat 20 to 30 grams of soluble fiber every day. Foods such as fruits and vegetables, whole grains, beans, peas, nuts, and seeds can help lower LDL. Avoid: Saturated fats (Dairy foods - such as butter, cream, ghee, regular-fat milk and cheese. Meat - such as fatty cuts of beef, pork and lamb, processed meats like salami, sausages and the skin on chicken. Lard., fatty snack foods, cakes, biscuits, pies and deep fried foods) Avoid smoking  - After you have completed your once a week vitamin D take 1,000-2,000 IU daily or plan to get about 10,000 IU a week.   Caution: prolonged use of proton pump inhibitors like omeprazole (Prilosec), pantoprazole (Protonix), esomeprazole (Nexium), and others like Dexilant and Aciphex may increase your risk of pneumonia, Clostridium difficile colitis, osteoporosis, anemia and other health complications Try to limit or avoid triggers like coffee, caffeinated beverages, onions, chocolate, spicy foods, peppermint, acid foods like pizza, spaghetti sauce, and orange juice Lose weight if you are overweight or obese Try elevating the head of your bed by placing a small wedge between your mattress and box springs to keep acid in the stomach at night instead of coming up into your esophagus

## 2019-01-25 ENCOUNTER — Telehealth: Payer: Self-pay | Admitting: Gastroenterology

## 2019-01-25 NOTE — Telephone Encounter (Signed)
Patient called & L/M on V/M stating he wanted to speak with Dr Marius Ditch. He is still having bleeding with his bowel movements. Please advise.

## 2019-01-25 NOTE — Telephone Encounter (Signed)
LVM asked pt to return call concerning appt.

## 2019-01-27 NOTE — Telephone Encounter (Signed)
Pt is returning a call for Douglas Abbott  He did not have his phone but has it back now please return his call.

## 2019-01-27 NOTE — Telephone Encounter (Signed)
I have made several attempts to return pt call, however there is no answer I have left 4 VM, unable to reach pt

## 2019-01-27 NOTE — Telephone Encounter (Signed)
Spoke with pt and scheduled a video visit for tomorrow 01/28/2019, pt verbalized understanding

## 2019-01-28 ENCOUNTER — Ambulatory Visit (INDEPENDENT_AMBULATORY_CARE_PROVIDER_SITE_OTHER): Payer: 59 | Admitting: Gastroenterology

## 2019-01-28 ENCOUNTER — Telehealth: Payer: Self-pay | Admitting: Family Medicine

## 2019-01-28 DIAGNOSIS — K625 Hemorrhage of anus and rectum: Secondary | ICD-10-CM | POA: Diagnosis not present

## 2019-01-28 DIAGNOSIS — K648 Other hemorrhoids: Secondary | ICD-10-CM | POA: Diagnosis not present

## 2019-01-28 MED ORDER — HYDROCORTISONE (PERIANAL) 2.5 % EX CREA: 1.0000 "application " | TOPICAL_CREAM | Freq: Two times a day (BID) | CUTANEOUS | 0 refills | Status: DC

## 2019-01-28 NOTE — Telephone Encounter (Signed)
I'm going to encourage him to have all this done by the doctor who is managing the hemorrhoids do the ordering I'm not comfortable ordering labs for another provider like this They should be able to order their own labs Thank you

## 2019-01-28 NOTE — Progress Notes (Signed)
Sherri Sear, MD [address]  [place]  [place], [place] [address]  Main: [phone]  Fax: [phone]    Gastroenterology follow-up video Visit  Referring Provider:     Arnetha Courser, MD Primary Care Physician:  Arnetha Courser, MD Primary Gastroenterologist:  Dr. Cephas Darby Reason for Consultation:     Rectal bleeding, internal hemorrhoids        HPI:   Douglas Laday. is a 53 y.o. male referred by Dr. Arnetha Courser, MD  for consultation & management of rectal bleeding, internal hemorrhoids  Virtual Visit via Telephone Note  I connected with Douglas Glaze. on 01/28/19 at 11:30 AM EDT by video and verified that I am speaking with the correct person using two identifiers.   I discussed the limitations, risks, security and privacy concerns of performing an evaluation and management service by video and the availability of in person appointments. I also discussed with the patient that there may be a patient responsible charge related to this service. The patient expressed understanding and agreed to proceed.  Location of the Patient: Home  Location of the provider: Home office   History of Present Illness: Douglas Abbott has been seeing me for management of internal hemorrhoids that have caused rectal bleeding.  He underwent ligation of RA, RP, LL internal hemorrhoids, last banding was on 12/29/2018.  He reports that he has been experiencing bright red bleeding per rectum, painless, on wiping and as well as dripping into the toilet bowl almost always with every bowel movement for the last 3 weeks.  He also tells me that he has been walking around 28,000-30,000 steps a day at his job.  Generally Thursdays and Fridays are he days off.  During this COVID-19 pandemic, he has not been working as much but he continues to have rectal bleeding.  He has not tried any topical medication.  He reports having regular bowel movements, he denies any other symptoms.  He  denies fatigue, lightheadedness, chest pain, shortness of breath.  He does not have anemia based on previous labs     Past Medical History:  Diagnosis Date   Allergic rhinitis    Allergy    Asthma    Decreased libido    History of pleurisy    Sinus bradycardia    a. 03/2017 Echo: Ef 50-55%.    Past Surgical History:  Procedure Laterality Date   COLONOSCOPY WITH PROPOFOL N/A 05/04/2018   Procedure: COLONOSCOPY WITH PROPOFOL;  Surgeon: Jonathon Bellows, MD;  Location: [facility] ENDOSCOPY;  Service: Gastroenterology;  Laterality: N/A;   NO PAST SURGERIES      Current Outpatient Medications:    albuterol (PROVENTIL HFA;VENTOLIN HFA) 108 (90 Base) MCG/ACT inhaler, Inhale 1-2 puffs into the lungs every 6 (six) hours as needed for wheezing or shortness of breath., Disp: 1 Inhaler, Rfl: 3   budesonide-formoterol (SYMBICORT) 160-4.5 MCG/ACT inhaler, Inhale 2 puffs into the lungs 2 (two) times daily., Disp: 3 Inhaler, Rfl: 3   ketoconazole (NIZORAL) 2 % cream, Apply 1 application topically daily., Disp: 30 g, Rfl: 0   mometasone (NASONEX) 50 MCG/ACT nasal spray, Place 2 sprays into the nose daily., Disp: 51 g, Rfl: 3   montelukast (SINGULAIR) 10 MG tablet, Take 1 tablet (10 mg total) by mouth at bedtime., Disp: 90 tablet, Rfl: 3   omeprazole (PRILOSEC) 20 MG capsule, Take 1 capsule (20 mg total) by mouth 2 (two) times daily., Disp: 180 capsule, Rfl: 1  Vitamin D, Ergocalciferol, (DRISDOL) 1.25 MG (50000 UT) CAPS capsule, Take 1 capsule (50,000 Units total) by mouth every 7 (seven) days., Disp: 4 capsule, Rfl: 1   hydrocortisone (ANUSOL-HC) 2.5 % rectal cream, Place 1 application rectally 2 (two) times daily., Disp: 30 g, Rfl: 0   Family History  Problem Relation Age of Onset   Asthma Mother    Lung cancer Mother    Kidney disease Mother    Heart disease Neg Hx      Social History   Tobacco Use   Smoking status: Former Smoker    Packs/day: 1.00    Years: 8.00    Pack  years: 8.00    Types: Cigarettes    Last attempt to quit: 2000    Years since quitting: 20.3   Smokeless tobacco: Never Used  Substance Use Topics   Alcohol use: No    Alcohol/week: 0.0 standard drinks    Frequency: Never   Drug use: Yes    Frequency: 1.0 times per week    Types: Marijuana    Comment: occasional marijuana use    Allergies as of 01/28/2019 - Review Complete 01/28/2019  Allergen Reaction Noted   Shellfish allergy Swelling 03/04/2017   Peanut-containing drug products  09/24/2018     Imaging Studies: No abdominal imaging  Assessment and Plan:   Douglas Abbott. is a 53 y.o. male with rectal bleeding secondary to internal hemorrhoids, status post ligation of RA, RP and LL.  He continues to have ongoing bleeding from internal hemorrhoids.  Trial of 2 weeks course of Anusol cream If his bleeding is persistent, recommend repeat hemorrhoid ligation Check CBC, iron studies, B12 and folate levels   Follow Up Instructions:   I discussed the assessment and treatment plan with the patient. The patient was provided an opportunity to ask questions and all were answered. The patient agreed with the plan and demonstrated an understanding of the instructions.   The patient was advised to call back or seek an in-person evaluation if the symptoms worsen or if the condition fails to improve as anticipated.  I provided 12 minutes of face-to-face time during this encounter.   Follow up in 4 weeks   Cephas Darby, MD

## 2019-01-28 NOTE — Telephone Encounter (Signed)
Pt.notified

## 2019-01-28 NOTE — Telephone Encounter (Signed)
Copied from [place] [phone]. Topic: Quick Communication - See Telephone Encounter >> Jan 28, 2019 12:02 PM Loma Boston wrote: CRM for notification. See Telephone encounter for: 01/28/19. Please call pt at [phone]. He had some band work done in his colon. Is having issues, and the provider that performed it told him that he needs to have his hemoglobin checked. He is wanting Dr Sanda Klein to order lab so it could be done at his primary care level rather than going to [facility]. Please contact him today if possible at [phone]. He is still bleeding some and they wanted it done by tomorrow.

## 2019-02-11 DIAGNOSIS — L821 Other seborrheic keratosis: Secondary | ICD-10-CM | POA: Diagnosis not present

## 2019-02-11 DIAGNOSIS — L720 Epidermal cyst: Secondary | ICD-10-CM | POA: Diagnosis not present

## 2019-02-11 DIAGNOSIS — Z1283 Encounter for screening for malignant neoplasm of skin: Secondary | ICD-10-CM | POA: Diagnosis not present

## 2019-02-23 ENCOUNTER — Telehealth: Payer: Self-pay | Admitting: Gastroenterology

## 2019-02-23 NOTE — Telephone Encounter (Signed)
I spoke with patient while r/s appointment & he states he's not able to go to the bathroom & stomach pain. Please call patient to advise

## 2019-02-23 NOTE — Telephone Encounter (Signed)
Pt is constipated and would like to know what he can take, please advise

## 2019-02-23 NOTE — Telephone Encounter (Signed)
He can try miralax and metamucil 1-2 times daily I ordered labs during last visit for rectal bleeding. He said he will go. Please ask him to go  Cephas Darby, MD [address]  [place]  [place], [place] [address]  Main: [phone]  Fax: [phone] Pager: [phone]

## 2019-02-24 NOTE — Telephone Encounter (Signed)
Pt has been notified via VM

## 2019-02-25 ENCOUNTER — Telehealth: Payer: Self-pay | Admitting: Gastroenterology

## 2019-02-25 NOTE — Telephone Encounter (Signed)
Pt left vm he states he spoke with someone  Wilburn Mylar and he went to [facility] for blood work but they told him he had to see their Doctor if he wanted Lab work done there please call pt

## 2019-02-25 NOTE — Telephone Encounter (Signed)
Spoke with pt and confirmed lab work, pt states he will go to [facility] lab in the morning 02/26/2019

## 2019-02-26 ENCOUNTER — Other Ambulatory Visit
Admission: RE | Admit: 2019-02-26 | Discharge: 2019-02-26 | Disposition: A | Discharge status: Home / Self Care | Payer: 59 | Source: Ambulatory Visit | Attending: Gastroenterology | Admitting: Gastroenterology

## 2019-02-26 DIAGNOSIS — K625 Hemorrhage of anus and rectum: Secondary | ICD-10-CM | POA: Insufficient documentation

## 2019-02-26 DIAGNOSIS — K648 Other hemorrhoids: Secondary | ICD-10-CM | POA: Diagnosis not present

## 2019-02-26 LAB — CBC
HCT: 38.1 % — ABNORMAL LOW (ref 39.0–52.0)
Hemoglobin: 12.6 g/dL — ABNORMAL LOW (ref 13.0–17.0)
MCH: 30.2 pg (ref 26.0–34.0)
MCHC: 33.1 g/dL (ref 30.0–36.0)
MCV: 91.4 fL (ref 80.0–100.0)
Platelets: 287 10*3/uL (ref 150–400)
RBC: 4.17 MIL/uL — ABNORMAL LOW (ref 4.22–5.81)
RDW: 14.5 % (ref 11.5–15.5)
WBC: 4.8 10*3/uL (ref 4.0–10.5)
nRBC: 0 % (ref 0.0–0.2)

## 2019-02-26 LAB — VITAMIN B12: Vitamin B-12: 414 pg/mL (ref 180–914)

## 2019-02-26 LAB — IRON AND TIBC
Iron: 73 ug/dL (ref 45–182)
Saturation Ratios: 24 % (ref 17.9–39.5)
TIBC: 300 ug/dL (ref 250–450)
UIBC: 227 ug/dL

## 2019-02-26 LAB — FOLATE: Folate: 19.3 ng/mL (ref >5.9)

## 2019-02-26 LAB — FERRITIN: Ferritin: 227 ng/mL (ref 24–336)

## 2019-03-04 ENCOUNTER — Ambulatory Visit: Payer: 59 | Admitting: Gastroenterology

## 2019-03-09 ENCOUNTER — Ambulatory Visit: Payer: 59 | Admitting: Gastroenterology

## 2019-03-24 ENCOUNTER — Telehealth: Payer: 59 | Admitting: Family

## 2019-03-24 DIAGNOSIS — J029 Acute pharyngitis, unspecified: Secondary | ICD-10-CM | POA: Diagnosis not present

## 2019-03-24 MED ORDER — LIDOCAINE VISCOUS HCL 2 % MT SOLN: 15.0000 mL | OROMUCOSAL | 0 refills | Status: DC | PRN

## 2019-03-24 NOTE — Progress Notes (Signed)
We are sorry that you are not feeling well.  Here is how we plan to help!  Your symptoms indicate a likely viral infection (Pharyngitis).   Pharyngitis is inflammation in the back of the throat which can cause a sore throat, scratchiness and sometimes difficulty swallowing.   Pharyngitis is typically caused by a respiratory virus and will just run its course.  Please keep in mind that your symptoms could last up to 10 days.  For throat pain, we recommend over the counter oral pain relief medications such as acetaminophen or aspirin, or anti-inflammatory medications such as ibuprofen or naproxen sodium.  Topical treatments such as oral throat lozenges or sprays may be used as needed.  Avoid close contact with loved ones, especially the very young and elderly.  Remember to wash your hands thoroughly throughout the day as this is the number one way to prevent the spread of infection and wipe down door knobs and counters with disinfectant.  I have sent in 2% Viscous Lidocaine 5 ml gargle and swallow every 4 hours as needed for throat pain.  Approximately 5 minutes was spent documenting and reviewing patient's chart.    After careful review of your answers, I would not recommend and antibiotic for your condition.  Antibiotics should not be used to treat conditions that we suspect are caused by viruses like the virus that causes the common cold or flu. However, some people can have Strep with atypical symptoms. You may need formal testing in clinic or office to confirm if your symptoms continue or worsen.  Providers prescribe antibiotics to treat infections caused by bacteria. Antibiotics are very powerful in treating bacterial infections when they are used properly.  To maintain their effectiveness, they should be used only when necessary.  Overuse of antibiotics has resulted in the development of super bugs that are resistant to treatment!    Home Care:  Only take medications as instructed by your medical  team.  Do not drink alcohol while taking these medications.  A steam or ultrasonic humidifier can help congestion.  You can place a towel over your head and breathe in the steam from hot water coming from a faucet.  Avoid close contacts especially the very young and the elderly.  Cover your mouth when you cough or sneeze.  Always remember to wash your hands.  Get Help Right Away If:  You develop worsening fever or throat pain.  You develop a severe head ache or visual changes.  Your symptoms persist after you have completed your treatment plan.  Make sure you  Understand these instructions.  Will watch your condition.  Will get help right away if you are not doing well or get worse.  Your e-visit answers were reviewed by a board certified advanced clinical practitioner to complete your personal care plan.  Depending on the condition, your plan could have included both over the counter or prescription medications.  If there is a problem please reply  once you have received a response from your provider.  Your safety is important to [place].  If you have drug allergies check your prescription carefully.    You can use MyChart to ask questions about todays visit, request a non-urgent call back, or ask for a work or school excuse for 24 hours related to this e-Visit. If it has been greater than 24 hours you will need to follow up with your provider, or enter a new e-Visit to address those concerns.  You will get an e-mail  in the next two days asking about your experience.  I hope that your e-visit has been valuable and will speed your recovery. Thank you for using e-visits.

## 2019-05-03 ENCOUNTER — Ambulatory Visit (INDEPENDENT_AMBULATORY_CARE_PROVIDER_SITE_OTHER): Payer: 59 | Admitting: Family Medicine

## 2019-05-03 ENCOUNTER — Ambulatory Visit
Admission: RE | Admit: 2019-05-03 | Discharge: 2019-05-03 | Disposition: A | Discharge status: Home / Self Care | Payer: 59 | Source: Ambulatory Visit | Attending: Family Medicine | Admitting: Family Medicine

## 2019-05-03 ENCOUNTER — Encounter: Payer: Self-pay | Admitting: Family Medicine

## 2019-05-03 ENCOUNTER — Ambulatory Visit
Admission: RE | Admit: 2019-05-03 | Discharge: 2019-05-03 | Disposition: A | Discharge status: Home / Self Care | Payer: 59 | Attending: Family Medicine | Admitting: Family Medicine

## 2019-05-03 ENCOUNTER — Other Ambulatory Visit: Payer: Self-pay

## 2019-05-03 VITALS — BP 112/70 | HR 76 | Temp 97.8°F | Resp 18 | Ht 74.0 in | Wt 171.5 lb

## 2019-05-03 DIAGNOSIS — M25572 Pain in left ankle and joints of left foot: Secondary | ICD-10-CM | POA: Diagnosis not present

## 2019-05-03 NOTE — Progress Notes (Signed)
Name: Douglas Abbott.   MRN: [id number]    DOB: [birth year]   Date:05/03/2019       Progress Note  Subjective  Chief Complaint  Chief Complaint  Patient presents with  . Joint Swelling    left ankle pian for 5 days    HPI  Pt presents with 5 day history of LEFT anterior foot and medial ankle pain.  Notes left ankle had injury many years ago when a car fell on the left ankle, however he did not require surgical intervention at that time.  He also notes that he is "double jointed" and very flexible.  About 5 days ago he was walking and stepped wrong - ankle did not roll, he did not fall, but he felt some "crunchiness" in the anterior/medial ankle.  Tried compression, elevation, and Biofreeze without relief.  Having trouble bearing weight, but is able to walk without assistance.     Patient Active Problem List   Diagnosis Date Noted  . Hemorrhoids 12/30/2018  . Preventative health care 11/26/2018  . Bradycardia 09/29/2018  . Borderline high cholesterol 06/29/2018  . Vitamin D deficiency 06/29/2018  . Need for pneumococcal vaccination 03/27/2018  . BRBPR (bright red blood per rectum) 03/27/2018  . Right shoulder pain 09/16/2017  . GERD (gastroesophageal reflux disease) 01/02/2017  . Blood in the urine 09/11/2015  . Asthma, chronic 03/09/2015  . Allergic rhinitis 03/09/2015    Social History   Tobacco Use  . Smoking status: Former Smoker    Packs/day: 1.00    Years: 8.00    Pack years: 8.00    Types: Cigarettes    Quit date: 2000    Years since quitting: 20.5  . Smokeless tobacco: Never Used  Substance Use Topics  . Alcohol use: No    Alcohol/week: 0.0 standard drinks    Frequency: Never     Current Outpatient Medications:  .  albuterol (PROVENTIL HFA;VENTOLIN HFA) 108 (90 Base) MCG/ACT inhaler, Inhale 1-2 puffs into the lungs every 6 (six) hours as needed for wheezing or shortness of breath., Disp: 1 Inhaler, Rfl: 3 .  budesonide-formoterol (SYMBICORT) 160-4.5  MCG/ACT inhaler, Inhale 2 puffs into the lungs 2 (two) times daily., Disp: 3 Inhaler, Rfl: 3 .  mometasone (NASONEX) 50 MCG/ACT nasal spray, Place 2 sprays into the nose daily., Disp: 51 g, Rfl: 3 .  montelukast (SINGULAIR) 10 MG tablet, Take 1 tablet (10 mg total) by mouth at bedtime., Disp: 90 tablet, Rfl: 3 .  omeprazole (PRILOSEC) 20 MG capsule, Take 1 capsule (20 mg total) by mouth 2 (two) times daily., Disp: 180 capsule, Rfl: 1 .  hydrocortisone (ANUSOL-HC) 2.5 % rectal cream, Place 1 application rectally 2 (two) times daily. (Patient not taking: Reported on 05/03/2019), Disp: 30 g, Rfl: 0 .  ketoconazole (NIZORAL) 2 % cream, Apply 1 application topically daily. (Patient not taking: Reported on 05/03/2019), Disp: 30 g, Rfl: 0 .  lidocaine (XYLOCAINE) 2 % solution, Use as directed 15 mLs in the mouth or throat as needed for mouth pain. (Patient not taking: Reported on 05/03/2019), Disp: 100 mL, Rfl: 0 .  Vitamin D, Ergocalciferol, (DRISDOL) 1.25 MG (50000 UT) CAPS capsule, Take 1 capsule (50,000 Units total) by mouth every 7 (seven) days. (Patient not taking: Reported on 05/03/2019), Disp: 4 capsule, Rfl: 1  Allergies  Allergen Reactions  . Shellfish Allergy Swelling  . Peanut-Containing Drug Products     I personally reviewed active problem list, medication list, allergies, notes from last encounter,  lab results with the patient/caregiver today.  ROS  Constitutional: Negative for fever or weight change.  Respiratory: Negative for cough and shortness of breath.   Cardiovascular: Negative for chest pain or palpitations.  Gastrointestinal: Negative for abdominal pain, no bowel changes.  Musculoskeletal: Negative for gait problem or joint swelling.  Skin: Negative for rash.  Neurological: Negative for dizziness or headache.  No other specific complaints in a complete review of systems (except as listed in HPI above).  Objective  Vitals:   05/03/19 0807  BP: 112/70  Pulse: 76  Resp: 18   Temp: 97.8 F (36.6 C)  TempSrc: Oral  SpO2: 97%  Weight: 171 lb 8 oz (77.8 kg)  Height: 6\' 2"  (1.88 m)   Body mass index is 22.02 kg/m.  Nursing Note and Vital Signs reviewed.  Physical Exam  Constitutional: Patient appears well-developed and well-nourished. No distress.  HENT: Head: Normocephalic and atraumatic.  Eyes: Conjunctivae and EOM are normal. No scleral icterus. Neck: Normal range of motion. Neck supple. No JVD present. No thyromegaly present.  Cardiovascular: Normal rate, regular rhythm and normal heart sounds.  No murmur heard. No BLE edema. Pulmonary/Chest: Effort normal and breath sounds normal. No respiratory distress. Musculoskeletal: No joint effusions. No gross deformities.  There is decreased medial motion and moderate non-pitting edema for the LEFT ankle; There is significant pain with lateral and medial motions against light resistance.  Neurological: Pt is alert and oriented to person, place, and time. No cranial nerve deficit. Coordination, balance, strength, speech and gait are normal.  Skin: Skin is warm and dry. No rash noted. No erythema.  Psychiatric: Patient has a normal mood and affect. behavior is normal. Judgment and thought content normal.  No results found for this or any previous visit (from the past 72 hour(s)).  Assessment & Plan  1. Acute left ankle pain - He has been seeing Dr. Marius Ditch for rectal bleeding and some mild anemia.  We will avoid PO NSAID for now.  He will continue biofreeze, compression, and elevation.  He likely needs MRI after Xray. Discussed with patient regarding referral to ortho vs MRI order from PCP - he would like to see Xray results first and then decide. Advised to take Tylenol PRN. - DG Ankle Complete Left; Future - DG Foot Complete Left; Future  -Red flags and when to present for emergency care or RTC including fever >101.97F, chest pain, shortness of breath, new/worsening/un-resolving symptoms, reviewed with patient at  time of visit. Follow up and care instructions discussed and provided in AVS.

## 2019-05-11 DIAGNOSIS — M659 Synovitis and tenosynovitis, unspecified: Secondary | ICD-10-CM | POA: Insufficient documentation

## 2019-06-30 ENCOUNTER — Ambulatory Visit: Payer: Self-pay | Admitting: *Deleted

## 2019-06-30 NOTE — Telephone Encounter (Signed)
Patient is calling to report he has had chronic rectal bleeding on/off and he has had a couple colonoscopies without resolution. Patient is bleeding again. Patient states he had stomach discomfort- then has BM with blood in it.  Call to office to see if patient can be seen- no appointment available- advised UC. Patient informed to go to UC- and also may reach out to GI office for follow up with this..    Reason for Disposition . MODERATE rectal bleeding (small blood clots, passing blood without stool, or toilet water turns red)  Answer Assessment - Initial Assessment Questions 1. APPEARANCE of BLOOD: "What color is it?" "Is it passed separately, on the surface of the stool, or mixed in with the stool?"      Red- fresh blood 2. AMOUNT: "How much blood was passed?"      Not sure amount 3. FREQUENCY: "How many times has blood been passed with the stools?"      Fri/Sat- it started again 4. ONSET: "When was the blood first seen in the stools?" (Days or weeks)      This has been months on/off 5. DIARRHEA: "Is there also some diarrhea?" If so, ask: "How many diarrhea stools were passed in past 24 hours?"      no 6. CONSTIPATION: "Do you have constipation?" If so, "How bad is it?"     no 7. RECURRENT SYMPTOMS: "Have you had blood in your stools before?" If so, ask: "When was the last time?" and "What happened that time?"      Yes- he has had a couple colonoscopy  8. BLOOD THINNERS: "Do you take any blood thinners?" (e.g., Coumadin/warfarin, Pradaxa/dabigatran, aspirin)     no 9. OTHER SYMPTOMS: "Do you have any other symptoms?"  (e.g., abdominal pain, vomiting, dizziness, fever)     Abdominal pain- then follows blood in stool 10. PREGNANCY: "Is there any chance you are pregnant?" "When was your last menstrual period?"       n/a  Protocols used: RECTAL BLEEDING-A-AH

## 2019-07-01 NOTE — Telephone Encounter (Signed)
FYI I reached out to patient and he was at work

## 2019-07-01 NOTE — Telephone Encounter (Signed)
Needs eval in office or UC

## 2019-07-02 NOTE — Telephone Encounter (Signed)
Left message for patient to call and set up appointment with GI doctor, if symptoms persist over the weekend go to [facility]

## 2019-07-08 ENCOUNTER — Ambulatory Visit: Payer: 59 | Admitting: Family Medicine

## 2019-07-20 ENCOUNTER — Ambulatory Visit (INDEPENDENT_AMBULATORY_CARE_PROVIDER_SITE_OTHER): Payer: 59 | Admitting: Family Medicine

## 2019-07-20 ENCOUNTER — Other Ambulatory Visit: Payer: Self-pay

## 2019-07-20 ENCOUNTER — Encounter: Payer: Self-pay | Admitting: Family Medicine

## 2019-07-20 VITALS — BP 126/72 | HR 74 | Temp 97.3°F | Resp 18 | Ht 74.0 in | Wt 173.2 lb

## 2019-07-20 DIAGNOSIS — K219 Gastro-esophageal reflux disease without esophagitis: Secondary | ICD-10-CM | POA: Diagnosis not present

## 2019-07-20 DIAGNOSIS — K625 Hemorrhage of anus and rectum: Secondary | ICD-10-CM

## 2019-07-20 DIAGNOSIS — J4540 Moderate persistent asthma, uncomplicated: Secondary | ICD-10-CM | POA: Diagnosis not present

## 2019-07-20 DIAGNOSIS — E789 Disorder of lipoprotein metabolism, unspecified: Secondary | ICD-10-CM | POA: Diagnosis not present

## 2019-07-20 DIAGNOSIS — Z23 Encounter for immunization: Secondary | ICD-10-CM | POA: Diagnosis not present

## 2019-07-20 DIAGNOSIS — E559 Vitamin D deficiency, unspecified: Secondary | ICD-10-CM

## 2019-07-20 DIAGNOSIS — J3089 Other allergic rhinitis: Secondary | ICD-10-CM

## 2019-07-20 NOTE — Progress Notes (Signed)
Name: Douglas Abbott.   MRN: [id number]    DOB: [birth year]   Date:07/20/2019       Progress Note  Subjective  Chief Complaint  Chief Complaint  Patient presents with  . Asthma    6 month  follow up  . Rectal Bleeding    HPI  Vitamin D deficiency:he has picked up rx dose, and has tolerated the medication without difficulty. Energy is adequate, but has not noticed any major changes with supplement.  GERD: takes omeprazole here and there, maybe a few times, doesn't eat spicy foods but does have a good amount of acidic and fatty foods. He reports a taste in his mouth and thinks it was related to his asthma medication he is using the omeprazole maybe six times a month.   Hyperlipidemia: eats some pork but plans to increase vegetables. He has been allergy tested and is significantly allergic to most fruits and is tolerating cooked vegetables.   Asthma: uses Symbicort 2 puffs BID, albuterol as needed. 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 Only times he feels someone is "stepping on my chest" is when the weather changes. He is using albuterol 4 times a week. Denies new coughing, wheezing or awakening at night.   Allergic rhinitis: takes Singulair nightly when he remembers- discussed black box warning, uses nasonex PRN. Last time used was 2 days ago.   Tinea pedis- left foot, pinky toe is itching and continues. He has used Ketoconazole cream for 2 days now with relief of symptoms. Trying to remember to dry his feet and keeping his socks dry. Skin remains is intact.   Rectal Bleeding: Seeing Dr. Marius Ditch later this month; reports having multiple procedures related to his colon issues. Currently believes that the blood in stools could be related to his stress levels at work. Last bleeding episode was last week. He notices the bleeding occurs when he is drinking alcohol or spicy foods that's when he bleeds. Had colonoscopy July 2019.  Patient Active Problem  List   Diagnosis Date Noted  . Hemorrhoids 12/30/2018  . Bradycardia 09/29/2018  . Borderline high cholesterol 06/29/2018  . Vitamin D deficiency 06/29/2018  . BRBPR (bright red blood per rectum) 03/27/2018  . Right shoulder pain 09/16/2017  . GERD (gastroesophageal reflux disease) 01/02/2017  . Blood in the urine 09/11/2015  . Asthma, chronic 03/09/2015  . Allergic rhinitis 03/09/2015    Past Surgical History:  Procedure Laterality Date  . COLONOSCOPY WITH PROPOFOL N/A 05/04/2018   Procedure: COLONOSCOPY WITH PROPOFOL;  Surgeon: Jonathon Bellows, MD;  Location: [facility] ENDOSCOPY;  Service: Gastroenterology;  Laterality: N/A;  . NO PAST SURGERIES      Family History  Problem Relation Age of Onset  . Asthma Mother   . Lung cancer Mother   . Kidney disease Mother   . Heart disease Neg Hx     Social History   Socioeconomic History  . Marital status: Married    Spouse name: Kenney Houseman  . Number of children: 3  . Years of education: 36  . Highest education level: High school graduate  Occupational History  . Occupation: Scientist, forensic: [employer]  . Financial resource strain: Not hard at all  . Food insecurity    Worry: Never true    Inability: Never true  . Transportation needs    Medical: No    Non-medical: Not on file  Tobacco Use  . Smoking status: Former Smoker  Packs/day: 1.00    Years: 8.00    Pack years: 8.00    Types: Cigarettes    Quit date: 2000    Years since quitting: 20.7  . Smokeless tobacco: Never Used  Substance and Sexual Activity  . Alcohol use: No    Alcohol/week: 0.0 standard drinks    Frequency: Never  . Drug use: Yes    Frequency: 1.0 times per week    Types: Marijuana    Comment: occasional marijuana use  . Sexual activity: Yes    Partners: Female  Lifestyle  . Physical activity    Days per week: 0 days    Minutes per session: 0 min  . Stress: Not at all  Relationships  . Social Herbalist on phone: Once a week     Gets together: More than three times a week    Attends religious service: More than 4 times per year    Active member of club or organization: No    Attends meetings of clubs or organizations: Never    Relationship status: Married  . Intimate partner violence    Fear of current or ex partner: No    Emotionally abused: No    Physically abused: No    Forced sexual activity: No  Other Topics Concern  . Not on file  Social History Narrative  . Not on file     Current Outpatient Medications:  .  albuterol (PROVENTIL HFA;VENTOLIN HFA) 108 (90 Base) MCG/ACT inhaler, Inhale 1-2 puffs into the lungs every 6 (six) hours as needed for wheezing or shortness of breath., Disp: 1 Inhaler, Rfl: 3 .  budesonide-formoterol (SYMBICORT) 160-4.5 MCG/ACT inhaler, Inhale 2 puffs into the lungs 2 (two) times daily., Disp: 3 Inhaler, Rfl: 3 .  montelukast (SINGULAIR) 10 MG tablet, Take 1 tablet (10 mg total) by mouth at bedtime., Disp: 90 tablet, Rfl: 3 .  omeprazole (PRILOSEC) 20 MG capsule, Take 1 capsule (20 mg total) by mouth 2 (two) times daily., Disp: 180 capsule, Rfl: 1 .  hydrocortisone (ANUSOL-HC) 2.5 % rectal cream, Place 1 application rectally 2 (two) times daily. (Patient not taking: Reported on 05/03/2019), Disp: 30 g, Rfl: 0 .  ketoconazole (NIZORAL) 2 % cream, Apply 1 application topically daily. (Patient not taking: Reported on 05/03/2019), Disp: 30 g, Rfl: 0 .  lidocaine (XYLOCAINE) 2 % solution, Use as directed 15 mLs in the mouth or throat as needed for mouth pain. (Patient not taking: Reported on 05/03/2019), Disp: 100 mL, Rfl: 0 .  mometasone (NASONEX) 50 MCG/ACT nasal spray, Place 2 sprays into the nose daily. (Patient not taking: Reported on 07/20/2019), Disp: 51 g, Rfl: 3 .  Vitamin D, Ergocalciferol, (DRISDOL) 1.25 MG (50000 UT) CAPS capsule, Take 1 capsule (50,000 Units total) by mouth every 7 (seven) days. (Patient not taking: Reported on 05/03/2019), Disp: 4 capsule, Rfl: 1  Allergies   Allergen Reactions  . Shellfish Allergy Swelling  . Peanut-Containing Drug Products     I personally reviewed active problem list, medication list, allergies, health maintenance, notes from last encounter, lab results with the patient/caregiver today.   ROS  Constitutional: Negative for fever or weight change.  Respiratory: Negative for cough and shortness of breath.   Cardiovascular: Negative for chest pain or palpitations.  Gastrointestinal: Negative for abdominal pain, no bowel changes.  Musculoskeletal: Negative for gait problem or joint swelling.  Skin: Negative for rash.  Neurological: Negative for dizziness or headache.  No other specific complaints in a  complete review of systems (except as listed in HPI above).  Objective  Vitals:   07/20/19 0804  BP: 126/72  Pulse: 74  Resp: 18  Temp: (!) 97.3 F (36.3 C)  TempSrc: Oral  SpO2: 99%  Weight: 173 lb 3.2 oz (78.6 kg)  Height: 6\' 2"  (1.88 m)   Body mass index is 22.24 kg/m.  Physical Exam   Physical Exam Vitals signs reviewed.  Constitutional:      Appearance: He is well-developed.  HENT:     Head: Normocephalic and atraumatic.  Neck:     Musculoskeletal: Normal range of motion and neck supple.    Cardiovascular:     Heart sounds: Normal heart sounds.  Pulmonary:     Effort: Pulmonary effort is normal.     Breath sounds: Normal breath sounds.  Abdominal:     General: Bowel sounds are normal.     Palpations: Abdomen is soft.     Tenderness: There is no abdominal tenderness.  Musculoskeletal: Normal range of motion.  Skin:    General: Skin is warm and dry.     Capillary Refill: Capillary refill takes less than 2 seconds.  Neurological:     Mental Status: He is alert and oriented to person, place, and time.   Psychiatric:        Speech: Speech normal.        Behavior: Behavior normal.        Thought Content: Thought content normal.        Judgment: Judgment normal.   No results found for this or  any previous visit (from the past 72 hour(s)).  PHQ2/9: Depression screen [facility] 2/9 07/20/2019 05/03/2019 01/05/2019 11/25/2018 09/29/2018  Decreased Interest 0 0 0 0 0  Down, Depressed, Hopeless 0 0 0 0 0  PHQ - 2 Score 0 0 0 0 0  Altered sleeping 0 0 0 0 0  Tired, decreased energy 0 0 0 0 0  Change in appetite 0 0 0 0 0  Feeling bad or failure about yourself  0 0 0 0 0  Trouble concentrating 0 0 0 0 0  Moving slowly or fidgety/restless 0 0 0 0 0  Suicidal thoughts 0 0 0 0 0  PHQ-9 Score 0 0 0 0 0  Difficult doing work/chores Not difficult at all Not difficult at all Not difficult at all Not difficult at all Not difficult at all   PHQ-2/9 Result is negative.    Fall Risk: Fall Risk  07/20/2019 05/03/2019 01/05/2019 11/25/2018 09/29/2018  Falls in the past year? 0 0 0 0 0  Number falls in past yr: 0 0 - - 0  Injury with Fall? 0 0 - - 0  Follow up Falls evaluation completed Falls evaluation completed - - -    Assessment & Plan  1. Vitamin D deficiency - Taking supplement, continue to monitor energy levels   2. Gastroesophageal reflux disease, unspecified whether esophagitis present -Taking omeprazole daily to decrease symptoms. Consider changing antacid if no improvement on next follow up visit.   3. Borderline high cholesterol -Nutrition management, increase vegetables intake while decreasing fried foods.   4. Moderate persistent chronic asthma without complication -Taking Symbicort BID. Monitor use of albuterol.  Use Singular at bedtime, avoid skipping doses  5. Seasonal allergic rhinitis due to other allergic trigger - Use nasal spray daily  6. BRBPR (bright red blood per rectum) -Following up with Dr. Marius Ditch. Monitor stools, no alcohol or spicy foods.   7. Needs  flu shot - Flu Vaccine QUAD 6+ mos PF IM (Fluarix Quad PF)

## 2019-07-20 NOTE — Patient Instructions (Signed)
Take singulair each night.

## 2019-07-27 ENCOUNTER — Other Ambulatory Visit: Payer: Self-pay

## 2019-07-27 ENCOUNTER — Encounter: Payer: Self-pay | Admitting: Gastroenterology

## 2019-07-27 ENCOUNTER — Ambulatory Visit (INDEPENDENT_AMBULATORY_CARE_PROVIDER_SITE_OTHER): Payer: 59 | Admitting: Gastroenterology

## 2019-07-27 VITALS — BP 117/73 | HR 61 | Temp 98.2°F | Resp 16 | Ht 74.0 in | Wt 172.0 lb

## 2019-07-27 DIAGNOSIS — K640 First degree hemorrhoids: Secondary | ICD-10-CM

## 2019-07-27 DIAGNOSIS — K625 Hemorrhage of anus and rectum: Secondary | ICD-10-CM

## 2019-07-27 NOTE — Progress Notes (Signed)
Cephas Darby, MD [address]  [place]  [place], [place] [address]  Main: [phone]  Fax: [phone] Pager: [phone]   Primary Care Physician: Hubbard Hartshorn, FNP  Primary Gastroenterologist:  Dr. Jonathon Bellows  Chief Complaint  Patient presents with  . Blood In Stools    HPI: Douglas Abbott. is a 53 y.o. male painless rectal bleeding from internal hemorrhoids and referred here for hemorrhoid ligation.  Patient had a colonoscopy by Dr. Vicente Males in 04/2018 which revealed tiny tubular adenoma only and internal hemorrhoids.  He has persistent bright red blood per rectum.  Patient acknowledges that he spends 10 to 15 minutes on toilet during a bowel movement.  He denies any other hemorrhoidal symptoms  Follow-up tele visit 01/28/2019 Douglas Abbott has been seeing me for management of internal hemorrhoids that have caused rectal bleeding.  He underwent ligation of RA, RP, LL internal hemorrhoids, last banding was on 12/29/2018.  He reports that he has been experiencing bright red bleeding per rectum, painless, on wiping and as well as dripping into the toilet bowl almost always with every bowel movement for the last 3 weeks.  He also tells me that he has been walking around 28,000-30,000 steps a day at his job.  Generally Thursdays and Fridays are he days off.  During this COVID-19 pandemic, he has not been working as much but he continues to have rectal bleeding.  He has not tried any topical medication.  He reports having regular bowel movements, he denies any other symptoms.  He denies fatigue, lightheadedness, chest pain, shortness of breath.  He does not have anemia based on previous labs  Follow-up visit 07/27/2019 He no longer reports rectal bleeding since the episode in May.  He has been doing well since then.  He had a slight drop in hemoglobin in 02/2019 from 13.2-12.6 He noticed that drinking beer triggers rectal bleeding.   Current Outpatient Medications  Medication  Sig Dispense Refill  . albuterol (PROVENTIL HFA;VENTOLIN HFA) 108 (90 Base) MCG/ACT inhaler Inhale 1-2 puffs into the lungs every 6 (six) hours as needed for wheezing or shortness of breath. 1 Inhaler 3  . budesonide-formoterol (SYMBICORT) 160-4.5 MCG/ACT inhaler Inhale 2 puffs into the lungs 2 (two) times daily. 3 Inhaler 3  . ketoconazole (NIZORAL) 2 % cream Apply 1 application topically daily. 30 g 0  . montelukast (SINGULAIR) 10 MG tablet Take 1 tablet (10 mg total) by mouth at bedtime. 90 tablet 3  . omeprazole (PRILOSEC) 20 MG capsule Take 1 capsule (20 mg total) by mouth 2 (two) times daily. 180 capsule 1   No current facility-administered medications for this visit.     Allergies as of 07/27/2019 - Review Complete 07/27/2019  Allergen Reaction Noted  . Shellfish allergy Swelling 03/04/2017  . Peanut-containing drug products  09/24/2018   ROS:  General: Negative for anorexia, weight loss, fever, chills, fatigue, weakness. ENT: Negative for hoarseness, difficulty swallowing , nasal congestion. CV: Negative for chest pain, angina, palpitations, dyspnea on exertion, peripheral edema.  Respiratory: Negative for dyspnea at rest, dyspnea on exertion, cough, sputum, wheezing.  GI: See history of present illness. GU:  Negative for dysuria, hematuria, urinary incontinence, urinary frequency, nocturnal urination.  Endo: Negative for unusual weight change.    Physical Examination:   BP 117/73 (BP Location: Left Arm, Patient Position: Sitting, Cuff Size: Normal)   Pulse 61   Temp 98.2 F (36.8 C)   Resp 16   Ht 6\' 2"  (  1.88 m)   Wt 172 lb (78 kg)   BMI 22.08 kg/m   General: Well-nourished, well-developed in no acute distress.  Eyes: No icterus. Conjunctivae pink. Mouth: Oropharyngeal mucosa moist and pink , no lesions erythema or exudate. Lungs: Clear to auscultation bilaterally. Non-labored. Heart: Regular rate and rhythm, no murmurs rubs or gallops.  Abdomen: Bowel sounds are  normal, nontender, nondistended, no hepatosplenomegaly or masses, no hernia , no rebound or guarding.   Extremities: No lower extremity edema. No clubbing or deformities. Neuro: Alert and oriented x 3.  Grossly intact. Skin: Warm and dry, no jaundice.   Psych: Alert and cooperative, normal mood and affect.   Imaging Studies: No results found.  Assessment and Plan:   Douglas Folks. is a 53 y.o. male grade 1 internal hemorrhoids resulting in rectal bleeding status post hemorrhoid ligation x3.  Patient is no longer experiencing rectal bleeding.  If his bleeding recurs consistently, next it would be hemorrhoid surgery as patient prefers not to undergo ligation again  Follow up as needed   Dr Sherri Sear, MD

## 2019-07-30 ENCOUNTER — Telehealth: Payer: Self-pay | Admitting: Emergency Medicine

## 2019-07-30 NOTE — Telephone Encounter (Signed)
Copied from [place] [id number]. Topic: General - Other >> Jul 29, 2019  3:59 PM Ivar Drape wrote: Reason for CRM:   Pt would like a return call to discuss Covid on the job

## 2019-07-30 NOTE — Telephone Encounter (Signed)
Patient has no symptoms, but his job has had 3 lines shut down at [employer]. No precautions are being take as far as temperatures and mask. They are only checking people that are symptomatic and telling her one else use your better judgement. Patient was notified that if he get in symptoms please call office for virtual appointment due to his history of Asthma. He understands and mainly wanted you to be aware of what was going on

## 2019-07-30 NOTE — Telephone Encounter (Signed)
Documentation reviewed.  I can order a test for him if he has been near someone with known COVID-19 infection.  Please do offer testing to him if needed, even if asymptomatic if he has been exposed.

## 2019-10-25 ENCOUNTER — Encounter: Payer: Self-pay | Admitting: Family Medicine

## 2019-11-29 ENCOUNTER — Encounter: Payer: 59 | Admitting: Family Medicine

## 2020-01-14 ENCOUNTER — Other Ambulatory Visit: Payer: Self-pay | Admitting: Family Medicine

## 2020-01-14 DIAGNOSIS — J4540 Moderate persistent asthma, uncomplicated: Secondary | ICD-10-CM

## 2020-02-08 ENCOUNTER — Ambulatory Visit: Payer: Self-pay | Admitting: Family Medicine

## 2020-02-08 ENCOUNTER — Emergency Department
Admission: EM | Admit: 2020-02-08 | Discharge: 2020-02-08 | Disposition: A | Discharge status: Home / Self Care | Payer: 59 | Attending: Student in an Organized Health Care Education/Training Program | Admitting: Student in an Organized Health Care Education/Training Program

## 2020-02-08 ENCOUNTER — Other Ambulatory Visit: Payer: Self-pay

## 2020-02-08 DIAGNOSIS — K625 Hemorrhage of anus and rectum: Secondary | ICD-10-CM | POA: Diagnosis present

## 2020-02-08 DIAGNOSIS — Z87891 Personal history of nicotine dependence: Secondary | ICD-10-CM | POA: Insufficient documentation

## 2020-02-08 DIAGNOSIS — J45909 Unspecified asthma, uncomplicated: Secondary | ICD-10-CM | POA: Diagnosis not present

## 2020-02-08 DIAGNOSIS — K649 Unspecified hemorrhoids: Secondary | ICD-10-CM | POA: Diagnosis not present

## 2020-02-08 LAB — COMPREHENSIVE METABOLIC PANEL
ALT: 21 U/L (ref 0–44)
AST: 21 U/L (ref 15–41)
Albumin: 4.5 g/dL (ref 3.5–5.0)
Alkaline Phosphatase: 119 U/L (ref 38–126)
Anion gap: 7 (ref 5–15)
BUN: 18 mg/dL (ref 6–20)
CO2: 26 mmol/L (ref 22–32)
Calcium: 9.4 mg/dL (ref 8.9–10.3)
Chloride: 105 mmol/L (ref 98–111)
Creatinine, Ser: 1.16 mg/dL (ref 0.61–1.24)
GFR calc Af Amer: >60 mL/min (ref >60)
GFR calc non Af Amer: >60 mL/min (ref >60)
Glucose, Bld: 101 mg/dL — ABNORMAL HIGH (ref 70–99)
Potassium: 4 mmol/L (ref 3.5–5.1)
Sodium: 138 mmol/L (ref 135–145)
Total Bilirubin: 0.9 mg/dL (ref 0.3–1.2)
Total Protein: 7.5 g/dL (ref 6.5–8.1)

## 2020-02-08 LAB — CBC
HCT: 42.3 % (ref 39.0–52.0)
Hemoglobin: 14.3 g/dL (ref 13.0–17.0)
MCH: 30.8 pg (ref 26.0–34.0)
MCHC: 33.8 g/dL (ref 30.0–36.0)
MCV: 91 fL (ref 80.0–100.0)
Platelets: 335 10*3/uL (ref 150–400)
RBC: 4.65 MIL/uL (ref 4.22–5.81)
RDW: 14 % (ref 11.5–15.5)
WBC: 5.7 10*3/uL (ref 4.0–10.5)
nRBC: 0 % (ref 0.0–0.2)

## 2020-02-08 LAB — LIPASE, BLOOD: Lipase: 26 U/L (ref 11–51)

## 2020-02-08 MED ORDER — HYDROCORTISONE ACETATE 25 MG RE SUPP: 25.0000 mg | Freq: Two times a day (BID) | RECTAL | 1 refills | Status: DC

## 2020-02-08 MED ORDER — POLYETHYLENE GLYCOL 3350 17 G PO PACK: 17.0000 g | PACK | Freq: Every day | ORAL | 0 refills | Status: DC

## 2020-02-08 MED ORDER — SODIUM CHLORIDE 0.9% FLUSH: 3.0000 mL | Freq: Once | INTRAVENOUS | Status: DC

## 2020-02-08 NOTE — ED Triage Notes (Signed)
Pt comes via POV from home with c/o blood in stool. Pt states bright red and clots in stool. Pt states some abdominal pain that was intense this am  Pt states he has had several episode of bright red in stool.   Pt states he used to drink several years ago. Pt states he recently made some [employer] chicken. Pt states this started after that.   Pt unsure if it is a high hemorrhoid or not.

## 2020-02-08 NOTE — ED Provider Notes (Signed)
[facility] Emergency Department Provider Note    First MD Initiated Contact with Patient 02/08/20 1902     (approximate)  I have reviewed the triage vital signs and the nursing notes.   HISTORY  Chief Complaint blood in stool    HPI Douglas Abbott. is a 54 y.o. male pro blood per rectum and history of internal hemorrhoids.  Is not any anticoagulation.  States that he does have a history of having to strain to use the restroom.  He does not drink alcohol.  He denies any abdominal pain.  No symptoms of reflux.  Denies any fevers.  No lower abdominal pain at this time.  States he was having bleeding earlier this morning feel like he was passing clots.    Past Medical History:  Diagnosis Date  . Allergic rhinitis   . Allergy   . Asthma   . BRBPR (bright red blood per rectum) 03/27/2018  . Decreased libido   . History of pleurisy   . Sinus bradycardia    a. 03/2017 Echo: Ef 50-55%.   Family History  Problem Relation Age of Onset  . Asthma Mother   . Lung cancer Mother   . Kidney disease Mother   . Heart disease Neg Hx    Past Surgical History:  Procedure Laterality Date  . COLONOSCOPY WITH PROPOFOL N/A 05/04/2018   Procedure: COLONOSCOPY WITH PROPOFOL;  Surgeon: Jonathon Bellows, MD;  Location: [facility] ENDOSCOPY;  Service: Gastroenterology;  Laterality: N/A;  . NO PAST SURGERIES     Patient Active Problem List   Diagnosis Date Noted  . Synovitis and tenosynovitis 05/11/2019  . Hemorrhoids 12/30/2018  . Bradycardia 09/29/2018  . Borderline high cholesterol 06/29/2018  . Vitamin D deficiency 06/29/2018  . Right shoulder pain 09/16/2017  . GERD (gastroesophageal reflux disease) 01/02/2017  . Blood in the urine 09/11/2015  . Asthma, chronic 03/09/2015  . Allergic rhinitis 03/09/2015      Prior to Admission medications   Medication Sig Start Date End Date Taking? Authorizing Provider  albuterol (PROVENTIL HFA;VENTOLIN HFA) 108 (90 Base) MCG/ACT  inhaler Inhale 1-2 puffs into the lungs every 6 (six) hours as needed for wheezing or shortness of breath. 01/05/19   Poulose, Bethel Born, NP  budesonide-formoterol (SYMBICORT) 160-4.5 MCG/ACT inhaler INHALE 2 PUFFS INTO THE LUNGS TWICE DAILY 01/14/20   Hubbard Hartshorn, FNP  hydrocortisone (ANUSOL-HC) 25 MG suppository Place 1 suppository (25 mg total) rectally every 12 (twelve) hours. 02/08/20 02/07/21  Merlyn Lot, MD  ketoconazole (NIZORAL) 2 % cream Apply 1 application topically daily. 01/05/19   Poulose, Bethel Born, NP  montelukast (SINGULAIR) 10 MG tablet Take 1 tablet (10 mg total) by mouth at bedtime. 03/27/18   Arnetha Courser, MD  omeprazole (PRILOSEC) 20 MG capsule Take 1 capsule (20 mg total) by mouth 2 (two) times daily. 01/05/19   Poulose, Bethel Born, NP  polyethylene glycol (MIRALAX / GLYCOLAX) 17 g packet Take 17 g by mouth daily. Mix one tablespoon with 8oz of your favorite juice or water every day until you are having soft formed stools. Then start taking once daily if you didn't have a stool the day before. 02/08/20   Merlyn Lot, MD    Allergies Shellfish allergy and Peanut-containing drug products    Social History Social History   Tobacco Use  . Smoking status: Former Smoker    Packs/day: 1.00    Years: 8.00    Pack years: 8.00    Types:  Cigarettes    Quit date: 2000    Years since quitting: 21.3  . Smokeless tobacco: Never Used  Substance Use Topics  . Alcohol use: No    Alcohol/week: 0.0 standard drinks  . Drug use: Yes    Frequency: 1.0 times per week    Types: Marijuana    Comment: occasional marijuana use    Review of Systems Patient denies headaches, rhinorrhea, blurry vision, numbness, shortness of breath, chest pain, edema, cough, abdominal pain, nausea, vomiting, diarrhea, dysuria, fevers, rashes or hallucinations unless otherwise stated above in HPI. ____________________________________________   PHYSICAL EXAM:  VITAL SIGNS: Vitals:    02/08/20 1924 02/08/20 2024  BP: 121/88 (!) 126/92  Pulse: (!) 56 67  Resp: 16 18  Temp:    SpO2: 99% 98%    Constitutional: Alert and oriented.  Eyes: Conjunctivae are normal.  Head: Atraumatic. Nose: No congestion/rhinnorhea. Mouth/Throat: Mucous membranes are moist.   Neck: No stridor. Painless ROM.  Cardiovascular: Normal rate, regular rhythm. Grossly normal heart sounds.  Good peripheral circulation. Respiratory: Normal respiratory effort.  No retractions. Lungs CTAB. Gastrointestinal: Soft and nontender. No distention. No abdominal bruits. No CVA tenderness. Genitourinary: Nontender nonthrombosed external hemorrhoids.  No stenosis.  No blood on DRE. Musculoskeletal: No lower extremity tenderness nor edema.  No joint effusions. Neurologic:  Normal speech and language. No gross focal neurologic deficits are appreciated. No facial droop Skin:  Skin is warm, dry and intact. No rash noted. Psychiatric: Mood and affect are normal. Speech and behavior are normal.  ____________________________________________   LABS (all labs ordered are listed, but only abnormal results are displayed)  Results for orders placed or performed during the hospital encounter of 02/08/20 (from the past 24 hour(s))  Lipase, blood     Status: None   Collection Time: 02/08/20  4:13 PM  Result Value Ref Range   Lipase 26 11 - 51 U/L  Comprehensive metabolic panel     Status: Abnormal   Collection Time: 02/08/20  4:13 PM  Result Value Ref Range   Sodium 138 135 - 145 mmol/L   Potassium 4.0 3.5 - 5.1 mmol/L   Chloride 105 98 - 111 mmol/L   CO2 26 22 - 32 mmol/L   Glucose, Bld 101 (H) 70 - 99 mg/dL   BUN 18 6 - 20 mg/dL   Creatinine, Ser 1.16 0.61 - 1.24 mg/dL   Calcium 9.4 8.9 - 10.3 mg/dL   Total Protein 7.5 6.5 - 8.1 g/dL   Albumin 4.5 3.5 - 5.0 g/dL   AST 21 15 - 41 U/L   ALT 21 0 - 44 U/L   Alkaline Phosphatase 119 38 - 126 U/L   Total Bilirubin 0.9 0.3 - 1.2 mg/dL   GFR calc non Af Amer >60  >60 mL/min   GFR calc Af Amer >60 >60 mL/min   Anion gap 7 5 - 15  CBC     Status: None   Collection Time: 02/08/20  4:13 PM  Result Value Ref Range   WBC 5.7 4.0 - 10.5 K/uL   RBC 4.65 4.22 - 5.81 MIL/uL   Hemoglobin 14.3 13.0 - 17.0 g/dL   HCT 42.3 39.0 - 52.0 %   MCV 91.0 80.0 - 100.0 fL   MCH 30.8 26.0 - 34.0 pg   MCHC 33.8 30.0 - 36.0 g/dL   RDW 14.0 11.5 - 15.5 %   Platelets 335 150 - 400 K/uL   nRBC 0.0 0.0 - 0.2 %   ____________________________________________  ____________________________________________  RADIOLOGY   ____________________________________________   PROCEDURES  Procedure(s) performed:  Procedures    Critical Care performed: no ____________________________________________   INITIAL IMPRESSION / ASSESSMENT AND PLAN / ED COURSE  Pertinent labs & imaging results that were available during my care of the patient were reviewed by me and considered in my medical decision making (see chart for details).   DDX: Hemorrhoid, diverticulosis, anal stricture anal fissure, upper GI bleed  Brylan L Luqmaan Pakkala. is a 54 y.o. who presents to the ED with symptoms as described above.  Patient very well-appearing with stable hemoglobin.  His abdominal exam is soft and benign.  Rectal exam also reassuring.  Has extensive history of hemorrhoids requiring banding in the past.  Is not on any anticoagulation.  Given reassuring work-up and review of his previous colonoscopy showing no evidence of diverticulosis I discussed the case with Dr. Marius Ditch of GI who does agree to follow patient closely in clinic.  I will place him on suppository as well as MiraLAX and I suspect these episodes are secondary to hemorrhoid.  We discussed signs and symptoms for which he should return to the ER.     The patient was evaluated in Emergency Department today for the symptoms described in the history of present illness. He/she was evaluated in the context of the global COVID-19 pandemic,  which necessitated consideration that the patient might be at risk for infection with the SARS-CoV-2 virus that causes COVID-19. Institutional protocols and algorithms that pertain to the evaluation of patients at risk for COVID-19 are in a state of rapid change based on information released by regulatory bodies including the CDC and federal and state organizations. These policies and algorithms were followed during the patient's care in the ED.  As part of my medical decision making, I reviewed the following data within the [facility] notes reviewed and incorporated, Labs reviewed, notes from prior ED visits and [place] Controlled Substance Database   ____________________________________________   FINAL CLINICAL IMPRESSION(S) / ED DIAGNOSES  Final diagnoses:  Rectal bleeding  Hemorrhoids, unspecified hemorrhoid type      NEW MEDICATIONS STARTED DURING THIS VISIT:  Discharge Medication List as of 02/08/2020  8:03 PM    START taking these medications   Details  hydrocortisone (ANUSOL-HC) 25 MG suppository Place 1 suppository (25 mg total) rectally every 12 (twelve) hours., Starting Tue 02/08/2020, Until Wed 02/07/2021, Normal    polyethylene glycol (MIRALAX / GLYCOLAX) 17 g packet Take 17 g by mouth daily. Mix one tablespoon with 8oz of your favorite juice or water every day until you are having soft formed stools. Then start taking once daily if you didn't have a stool the day before., Starting Tue 02/08/2020, Normal         Note:  This document was prepared using Dragon voice recognition software and may include unintentional dictation errors.    Merlyn Lot, MD 02/08/20 2059

## 2020-02-08 NOTE — ED Notes (Signed)
Pt states that he came from his PCP for blood in his stool that has been an issue off and on for years. Pt denies abdominal pain, fevers, and states he feels fine.

## 2020-02-08 NOTE — Telephone Encounter (Signed)
Pt reports H/O rectal bleeding, "Several" colonoscopies over 2 years with polyps removed. States intermittent rectal bleeding since December, "Just a little then stops." States increased rectal bleeding 3 days ago. Reports clots present "Toilet paper full" commode water red, "Dripping down my leg at times." 3-4 times today with each BM, clots present.  States yesterday 1 episode with 2 BMS. Reports stool consistency WNL. Also reports lower abdominal pain, 4/10, some relief after having BMs. Reports nausea at times. Pt directed to ED, states unsure he would go, hesitant. NT called practice, Suanne Marker who reiterates disposition. Pt made aware, states will go to ED, wife to drive.  Reason for Disposition . [1] MODERATE rectal bleeding (small blood clots, passing blood without stool, or toilet water turns red) AND [2] more than once a day  Answer Assessment - Initial Assessment Questions 1. APPEARANCE of BLOOD: "What color is it?" "Is it passed separately, on the surface of the stool, or mixed in with the stool?"      Bright red, varies in stool, sometimes not, clot at times 2. AMOUNT: "How much blood was passed?"      Water very red 3. FREQUENCY: "How many times has blood been passed with the stools?"      Today, 3-4 times. Yesterday x 1 4. ONSET: "When was the blood first seen in the stools?" (Days or weeks)      2-3 days ago worsening 5. DIARRHEA: "Is there also some diarrhea?" If so, ask: "How many diarrhea stools were passed in past 24 hours?"      Stools WNL 6. CONSTIPATION: "Do you have constipation?" If so, "How bad is it?"     no 7. RECURRENT SYMPTOMS: "Have you had blood in your stools before?" If so, ask: "When was the last time?" and "What happened that time?"      1-2 years ago, colonoscopy, polyps removed 8. BLOOD THINNERS: "Do you take any blood thinners?" (e.g., Coumadin/warfarin, Pradaxa/dabigatran, aspirin)     no 9. OTHER SYMPTOMS: "Do you have any other symptoms?"  (e.g., abdominal  pain, vomiting, dizziness, fever)     Abdominal pain onset last night,lower stomach, "Burning" 4/10, 3 BMS after pain, nausea prior to BR.  Protocols used: RECTAL BLEEDING-A-AH

## 2020-02-09 ENCOUNTER — Telehealth: Payer: Self-pay

## 2020-02-09 NOTE — Telephone Encounter (Signed)
Please set up a virtual visit to see me if patient is agreeable ER visit, history of hemorrhoids   RV

## 2020-02-09 NOTE — Telephone Encounter (Signed)
Patient seen in ER

## 2020-02-09 NOTE — Telephone Encounter (Signed)
Tried to call patient but mailbox is full unable to leave a message

## 2020-02-09 NOTE — Telephone Encounter (Signed)
Tried to call patient someone answer and then hung up

## 2020-02-09 NOTE — Telephone Encounter (Signed)
Sent patient a mychart message.

## 2020-03-06 ENCOUNTER — Ambulatory Visit: Payer: 59 | Admitting: Family Medicine

## 2020-03-08 NOTE — Progress Notes (Signed)
Patient ID: Douglas Glaze., male    DOB: 09/05/1966, 54 y.o.   MRN: [id number]  PCP: Towanda Malkin, MD  Chief Complaint  Patient presents with  . Follow-up  . Rectal Bleeding    went to GI and has been doing well, has not noticed any blood lately    Subjective:   Douglas L Leeroy Ruedas. is a 54 y.o. male, presents to clinic with CC of the following:  Chief Complaint  Patient presents with  . Follow-up  . Rectal Bleeding    went to GI and has been doing well, has not noticed any blood lately    HPI:  Patient is a 54 year old male patient of Douglas Abbott His last visit with her was in October 2020. Follows up today.  He was recently seen in the emergency room in April 2021 for rectal bleeding. Patient was noted to be very well-appearing with stable hemoglobin.  His abdominal exam was soft and benign.  Rectal exam also reassuring.  Noted an extensive history of hemorrhoids requiring banding in the past.  Was not on any anticoagulation.  Given reassuring work-up and review of his previous colonoscopy showing no evidence of diverticulosis the case was discussed with Dr. Marius Ditch of GI who did agree to follow patient closely in clinic.  He was prescribed a suppository as well as MiraLAX and the bleeding was felt likely due to hemorrhoids.  Since that ER visit, he notes the bleeding has stopped, with no bleeding per rectum so far in May.  The GI doctor has tried to get in touch with him to have a follow-up visit after the ER visit, and it noted they have not been able to get in touch with him.  He was instructed today to call their office to help schedule that follow-up.  A CBC and comprehensive panel that were done on that emergency room encounter were okay and reviewed.  Other medical issues that have been followed in the past and that were reviewed today included:  Vitamin D deficiency: Had been on a vitamin D supplement, but not in the recent past. Last vitamin  D Lab Results  Component Value Date   VD25OH 18 (L) 11/25/2018    GERD: takes omeprazole int'ly, is helpful when he uses, used about 4X past month. doesn't eat spicy foods but does have a good amount of acidic and fatty foods.   Hyperlipidemia: He has been allergy tested and is significantly allergic to most fruits and is tolerating cooked vegetables. If cooks fruits, ok with that.  States wife eats very healthy and helps him eat healthy. Medication regimen-none Lab Results  Component Value Date   CHOL 192 11/25/2018   HDL 63 11/25/2018   LDLCALC 115 (H) 11/25/2018   TRIG 46 11/25/2018   CHOLHDL 3.0 11/25/2018   Denies any recent chest pains, shortness of breath, palpitations, increased ankle swelling, although did note at times his right ankle can be bothersome as he frequently turns it. Recommended trying an ankle support to help when he is out and about  Asthma: uses Symbicort 2 puffs BID, albuterol as needed.  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  He is using albuterol once a week now.  Denies new coughing, wheezing or awakening at night.   Allergic rhinitis: takes Singulair nightly. Thinks is helping.  uses nasonex PRN.  Tinea pedis- left foot, often around pinky toe itches,  He has used Ketoconazole cream  int'ly with relief of symptoms. Trying to remember to dry his feet and keeping his socks dry.   Rectal Bleeding: As above, recent ER visit noted.   Bleeding has stopped in the recent past, will follow up with GI  Patient Active Problem List   Diagnosis Date Noted  . Synovitis and tenosynovitis 05/11/2019  . Hemorrhoids 12/30/2018  . Bradycardia 09/29/2018  . Borderline high cholesterol 06/29/2018  . Vitamin D deficiency 06/29/2018  . Right shoulder pain 09/16/2017  . GERD (gastroesophageal reflux disease) 01/02/2017  . Blood in the urine 09/11/2015  . Asthma, chronic 03/09/2015  . Allergic rhinitis 03/09/2015      Current  Outpatient Medications:  .  albuterol (PROVENTIL HFA;VENTOLIN HFA) 108 (90 Base) MCG/ACT inhaler, Inhale 1-2 puffs into the lungs every 6 (six) hours as needed for wheezing or shortness of breath., Disp: 1 Inhaler, Rfl: 3 .  budesonide-formoterol (SYMBICORT) 160-4.5 MCG/ACT inhaler, INHALE 2 PUFFS INTO THE LUNGS TWICE DAILY, Disp: 30.6 g, Rfl: 0 .  hydrocortisone (ANUSOL-HC) 25 MG suppository, Place 1 suppository (25 mg total) rectally every 12 (twelve) hours., Disp: 12 suppository, Rfl: 1 .  ketoconazole (NIZORAL) 2 % cream, Apply 1 application topically daily., Disp: 30 g, Rfl: 0 .  montelukast (SINGULAIR) 10 MG tablet, Take 1 tablet (10 mg total) by mouth at bedtime., Disp: 90 tablet, Rfl: 3 .  omeprazole (PRILOSEC) 20 MG capsule, Take 1 capsule (20 mg total) by mouth 2 (two) times daily., Disp: 180 capsule, Rfl: 1 .  polyethylene glycol (MIRALAX / GLYCOLAX) 17 g packet, Take 17 g by mouth daily. Mix one tablespoon with 8oz of your favorite juice or water every day until you are having soft formed stools. Then start taking once daily if you didn't have a stool the day before., Disp: 30 each, Rfl: 0   Allergies  Allergen Reactions  . Shellfish Allergy Swelling  . Peanut-Containing Drug Products      Past Surgical History:  Procedure Laterality Date  . COLONOSCOPY WITH PROPOFOL N/A 05/04/2018   Procedure: COLONOSCOPY WITH PROPOFOL;  Surgeon: Jonathon Bellows, MD;  Location: [facility] ENDOSCOPY;  Service: Gastroenterology;  Laterality: N/A;  . NO PAST SURGERIES       Family History  Problem Relation Age of Onset  . Asthma Mother   . Lung cancer Mother   . Kidney disease Mother   . Heart disease Neg Hx      Social History   Tobacco Use  . Smoking status: Former Smoker    Packs/day: 1.00    Years: 8.00    Pack years: 8.00    Types: Cigarettes    Quit date: 2000    Years since quitting: 21.4  . Smokeless tobacco: Never Used  Substance Use Topics  . Alcohol use: No    Alcohol/week:  0.0 standard drinks    With staff assistance, above reviewed with the patient today.  ROS: As per HPI,+ intermittent right ankle pains with it giving out at times, otherwise no specific complaints on a limited and focused system review   No results found for this or any previous visit (from the past 72 hour(s)).   PHQ2/9: Depression screen [facility] 2/9 03/09/2020 07/20/2019 05/03/2019 01/05/2019 11/25/2018  Decreased Interest 0 0 0 0 0  Down, Depressed, Hopeless 0 0 0 0 0  PHQ - 2 Score 0 0 0 0 0  Altered sleeping 0 0 0 0 0  Tired, decreased energy 0 0 0 0 0  Change in appetite 0 0  0 0 0  Feeling bad or failure about yourself  0 0 0 0 0  Trouble concentrating 0 0 0 0 0  Moving slowly or fidgety/restless 0 0 0 0 0  Suicidal thoughts 0 0 0 0 0  PHQ-9 Score 0 0 0 0 0  Difficult doing work/chores Not difficult at all Not difficult at all Not difficult at all Not difficult at all Not difficult at all  Some recent data might be hidden   PHQ-2/9 Result is neg  Fall Risk: Fall Risk  03/09/2020 07/20/2019 05/03/2019 01/05/2019 11/25/2018  Falls in the past year? 0 0 0 0 0  Number falls in past yr: 0 0 0 - -  Injury with Fall? 0 0 0 - -  Follow up - Falls evaluation completed Falls evaluation completed - -      Objective:   Vitals:   03/09/20 0735  BP: 116/78  Pulse: 61  Resp: 16  Temp: 98.1 F (36.7 C)  TempSrc: Temporal  SpO2: 100%  Weight: 174 lb 3.2 oz (79 kg)  Height: 6\' 2"  (1.88 m)    Body mass index is 22.37 kg/m.  Physical Exam   NAD, masked, very pleasant HEENT - [place]/AT, sclera anicteric, PERRL, EOMI, conj - non-inj'ed, pharynx clear Neck - supple, no adenopathy, no TM, carotids 2+ and = without bruits bilat Car - RRR without m/g/r Pulm- RR and effort normal at rest, CTA without wheeze or rales Abd - soft, NT, ND, BS+,  no masses, no HSM Back - no CVA tenderness Skin- no rash noted on exposed areas,  Ext - no LE edema, no active joints, left foot at the base of the fifth  toe and between the fourth and fifth toe had mild scaliness, minimal erythema, no open areas of the skin evident. Neuro/psychiatric - affect was not flat, appropriate with conversation  Alert and oriented  Grossly non-focal - good strength on testing extremities, sensation intact to LT in distal extremities  Speech normal   Results for orders placed or performed during the hospital encounter of 02/08/20  Lipase, blood  Result Value Ref Range   Lipase 26 11 - 51 U/L  Comprehensive metabolic panel  Result Value Ref Range   Sodium 138 135 - 145 mmol/L   Potassium 4.0 3.5 - 5.1 mmol/L   Chloride 105 98 - 111 mmol/L   CO2 26 22 - 32 mmol/L   Glucose, Bld 101 (H) 70 - 99 mg/dL   BUN 18 6 - 20 mg/dL   Creatinine, Ser 1.16 0.61 - 1.24 mg/dL   Calcium 9.4 8.9 - 10.3 mg/dL   Total Protein 7.5 6.5 - 8.1 g/dL   Albumin 4.5 3.5 - 5.0 g/dL   AST 21 15 - 41 U/L   ALT 21 0 - 44 U/L   Alkaline Phosphatase 119 38 - 126 U/L   Total Bilirubin 0.9 0.3 - 1.2 mg/dL   GFR calc non Af Amer >60 >60 mL/min   GFR calc Af Amer >60 >60 mL/min   Anion gap 7 5 - 15  CBC  Result Value Ref Range   WBC 5.7 4.0 - 10.5 K/uL   RBC 4.65 4.22 - 5.81 MIL/uL   Hemoglobin 14.3 13.0 - 17.0 g/dL   HCT 42.3 39.0 - 52.0 %   MCV 91.0 80.0 - 100.0 fL   MCH 30.8 26.0 - 34.0 pg   MCHC 33.8 30.0 - 36.0 g/dL   RDW 14.0 11.5 - 15.5 %  Platelets 335 150 - 400 K/uL   nRBC 0.0 0.0 - 0.2 %       Assessment & Plan:   1. Tinea pedis of left foot Emphasized keeping the areas clean and dry, also can use the topical antifungal cream intermittently when more problematic as she has been doing, and it is keeping this well controlled. Refill the ketoconazole cream today. - ketoconazole (NIZORAL) 2 % cream; Apply 1 application topically daily.  Dispense: 30 g; Refill: 1  2. Moderate persistent chronic asthma without complication He notes he is only using the albuterol about once a week, with him using the Symbicort regularly and  continue to do so.  His symptoms have been well controlled with this. Also notes that Singulair has been helpful, and continue that nightly. - albuterol (VENTOLIN HFA) 108 (90 Base) MCG/ACT inhaler; Inhale 1-2 puffs into the lungs every 6 (six) hours as needed for wheezing or shortness of breath.  Dispense: 6.7 g; Refill: 3  3. BRBPR (bright red blood per rectum) As noted no further bleeding after that ER visit, and will follow up with GI as recommended. He was given the number today to call to follow-up as they have been trying to reach and without success.  4. Seasonal allergic rhinitis due to other allergic trigger Does well with the Singulair nightly and a Nasonex product intermittently as well. His asthma symptoms seem to be better when utilizing these entities to help control his allergic rhinitis.  We will continue.  5. Gastroesophageal reflux disease, unspecified whether esophagitis present He takes the omeprazole intermittently, only about 4 times this past month, and is helpful when he takes. Does reflux precautions Can continue the intermittent use of the omeprazole presently.  6. Borderline high cholesterol Reviewed, and his last labs were in February 2020.  Do feel rechecking his lipid panel would be helpful, and he did just have a comp panel and CBC done in the emergency room without a reason to recheck these entities. Agreed to hold off on checking today, and will do on his follow-up visit. Recommended continuing to eat healthy as he has been doing recently.  7. Vitamin D deficiency We will check of the vitamin D level on his follow-up when we recheck labs. Did feel resuming a vitamin D supplement was appropriate, with 1000 IUs of vitamin D recommended and he can pick up an over-the-counter supplement at the pharmacy to start.  That was put on his AVS to help as well.  With him having recent labs in the ER, did not push to recheck today, and will do on follow-up. Follow-up  in approximately 6 months time, sooner as needed.      Towanda Malkin, MD 03/09/20 7:46 AM

## 2020-03-09 ENCOUNTER — Ambulatory Visit (INDEPENDENT_AMBULATORY_CARE_PROVIDER_SITE_OTHER): Payer: 59 | Admitting: Internal Medicine

## 2020-03-09 ENCOUNTER — Encounter: Payer: Self-pay | Admitting: Internal Medicine

## 2020-03-09 ENCOUNTER — Other Ambulatory Visit: Payer: Self-pay

## 2020-03-09 VITALS — BP 116/78 | HR 61 | Temp 98.1°F | Resp 16 | Ht 74.0 in | Wt 174.2 lb

## 2020-03-09 DIAGNOSIS — B353 Tinea pedis: Secondary | ICD-10-CM | POA: Insufficient documentation

## 2020-03-09 DIAGNOSIS — K219 Gastro-esophageal reflux disease without esophagitis: Secondary | ICD-10-CM

## 2020-03-09 DIAGNOSIS — E559 Vitamin D deficiency, unspecified: Secondary | ICD-10-CM

## 2020-03-09 DIAGNOSIS — K625 Hemorrhage of anus and rectum: Secondary | ICD-10-CM

## 2020-03-09 DIAGNOSIS — J3089 Other allergic rhinitis: Secondary | ICD-10-CM | POA: Diagnosis not present

## 2020-03-09 DIAGNOSIS — E789 Disorder of lipoprotein metabolism, unspecified: Secondary | ICD-10-CM

## 2020-03-09 DIAGNOSIS — J4540 Moderate persistent asthma, uncomplicated: Secondary | ICD-10-CM

## 2020-03-09 MED ORDER — KETOCONAZOLE 2 % EX CREA: 1.0000 "application " | TOPICAL_CREAM | Freq: Every day | CUTANEOUS | 1 refills | Status: DC

## 2020-03-09 MED ORDER — ALBUTEROL SULFATE HFA 108 (90 BASE) MCG/ACT IN AERS: 1.0000 | INHALATION_SPRAY | Freq: Four times a day (QID) | RESPIRATORY_TRACT | 3 refills | Status: DC | PRN

## 2020-03-09 NOTE — Patient Instructions (Signed)
Please obtain a Vitamin D supplement - 1000IU's to take daily  Please call the GI doctors for f/u

## 2020-04-21 ENCOUNTER — Ambulatory Visit: Payer: 59 | Admitting: Gastroenterology

## 2020-06-06 ENCOUNTER — Other Ambulatory Visit: Payer: Self-pay | Admitting: Family Medicine

## 2020-06-06 NOTE — Telephone Encounter (Signed)
Requested medication (s) are due for refill today: na  Requested medication (s) are on the active medication list: no  Last refill:  na  Future visit scheduled: yes 09/12/20  Notes to clinic:  not delegated per protocol, med ordered by Dr. Sanda Klein     Requested Prescriptions  Pending Prescriptions Disp Refills   Vitamin D, Ergocalciferol, (DRISDOL) 1.25 MG (50000 UNIT) CAPS capsule [Pharmacy Med Name: VITAMIN D2 50,000IU (ERGO) CAP RX] 4 capsule     Sig: TAKE 1 CAPSULE BY MOUTH EVERY 7 DAYS      Endocrinology:  Vitamins - Vitamin D Supplementation Failed - 06/06/2020  3:48 PM      Failed - 50,000 IU strengths are not delegated      Failed - Phosphate in normal range and within 360 days    No results found for: PHOS        Failed - Vitamin D in normal range and within 360 days    Vit D, 25-Hydroxy  Date Value Ref Range Status  11/25/2018 18 (L) 30 - 100 ng/mL Final    Comment:    Vitamin D Status         25-OH Vitamin D: . Deficiency:                    <20 ng/mL Insufficiency:             20 - 29 ng/mL Optimal:                 > or = 30 ng/mL . For 25-OH Vitamin D testing on patients on  D2-supplementation and patients for whom quantitation  of D2 and D3 fractions is required, the QuestAssureD(TM) 25-OH VIT D, (D2,D3), LC/MS/MS is recommended: order  code [id number] (patients >33yrs). . For more information on this test, go to: http://education.questdiagnostics.com/faq/FAQ163 (This link is being provided for  informational/educational purposes only.)           Passed - Ca in normal range and within 360 days    Calcium  Date Value Ref Range Status  02/08/2020 9.4 8.9 - 10.3 mg/dL Final          Passed - Valid encounter within last 12 months    Recent Outpatient Visits           2 months ago BRBPR (bright red blood per rectum)   [place], MD   10 months ago Vitamin D deficiency   [place], FNP   1 year ago Acute left ankle pain   [place], FNP   1 year ago Vitamin D deficiency   [place], NP   1 year ago Preventative health care   [place], Satira Anis, MD       Future Appointments             In 2 days Vanga, Tally Due, MD [place]   In 3 months Towanda Malkin, MD [facility], [facility]

## 2020-06-07 ENCOUNTER — Other Ambulatory Visit: Payer: Self-pay

## 2020-06-07 DIAGNOSIS — J4540 Moderate persistent asthma, uncomplicated: Secondary | ICD-10-CM

## 2020-06-07 DIAGNOSIS — K219 Gastro-esophageal reflux disease without esophagitis: Secondary | ICD-10-CM

## 2020-06-07 MED ORDER — BUDESONIDE-FORMOTEROL FUMARATE 160-4.5 MCG/ACT IN AERO: 2.0000 | INHALATION_SPRAY | Freq: Two times a day (BID) | RESPIRATORY_TRACT | 3 refills | Status: DC

## 2020-06-07 MED ORDER — OMEPRAZOLE 20 MG PO CPDR: 20.0000 mg | DELAYED_RELEASE_CAPSULE | Freq: Two times a day (BID) | ORAL | 1 refills | Status: DC

## 2020-06-08 ENCOUNTER — Telehealth (INDEPENDENT_AMBULATORY_CARE_PROVIDER_SITE_OTHER): Payer: 59 | Admitting: Gastroenterology

## 2020-06-08 ENCOUNTER — Other Ambulatory Visit: Payer: Self-pay

## 2020-06-08 ENCOUNTER — Encounter: Payer: Self-pay | Admitting: *Deleted

## 2020-06-09 NOTE — Progress Notes (Signed)
[place], no show

## 2020-06-10 IMAGING — CR DG CHEST 2V
2 series · 3 of 3 positions shown · non-contrast
Comparison: PA and lateral chest x-ray January 06, 2016

CLINICAL DATA: Awakened yesterday with left-sided neck and
posterior shoulder discomfort. Subsequent development of
diaphoresis, hot sensation. History of asthma-bronchitis, former
smoker.

EXAM:
CHEST - 2 VIEW

[chest pa]
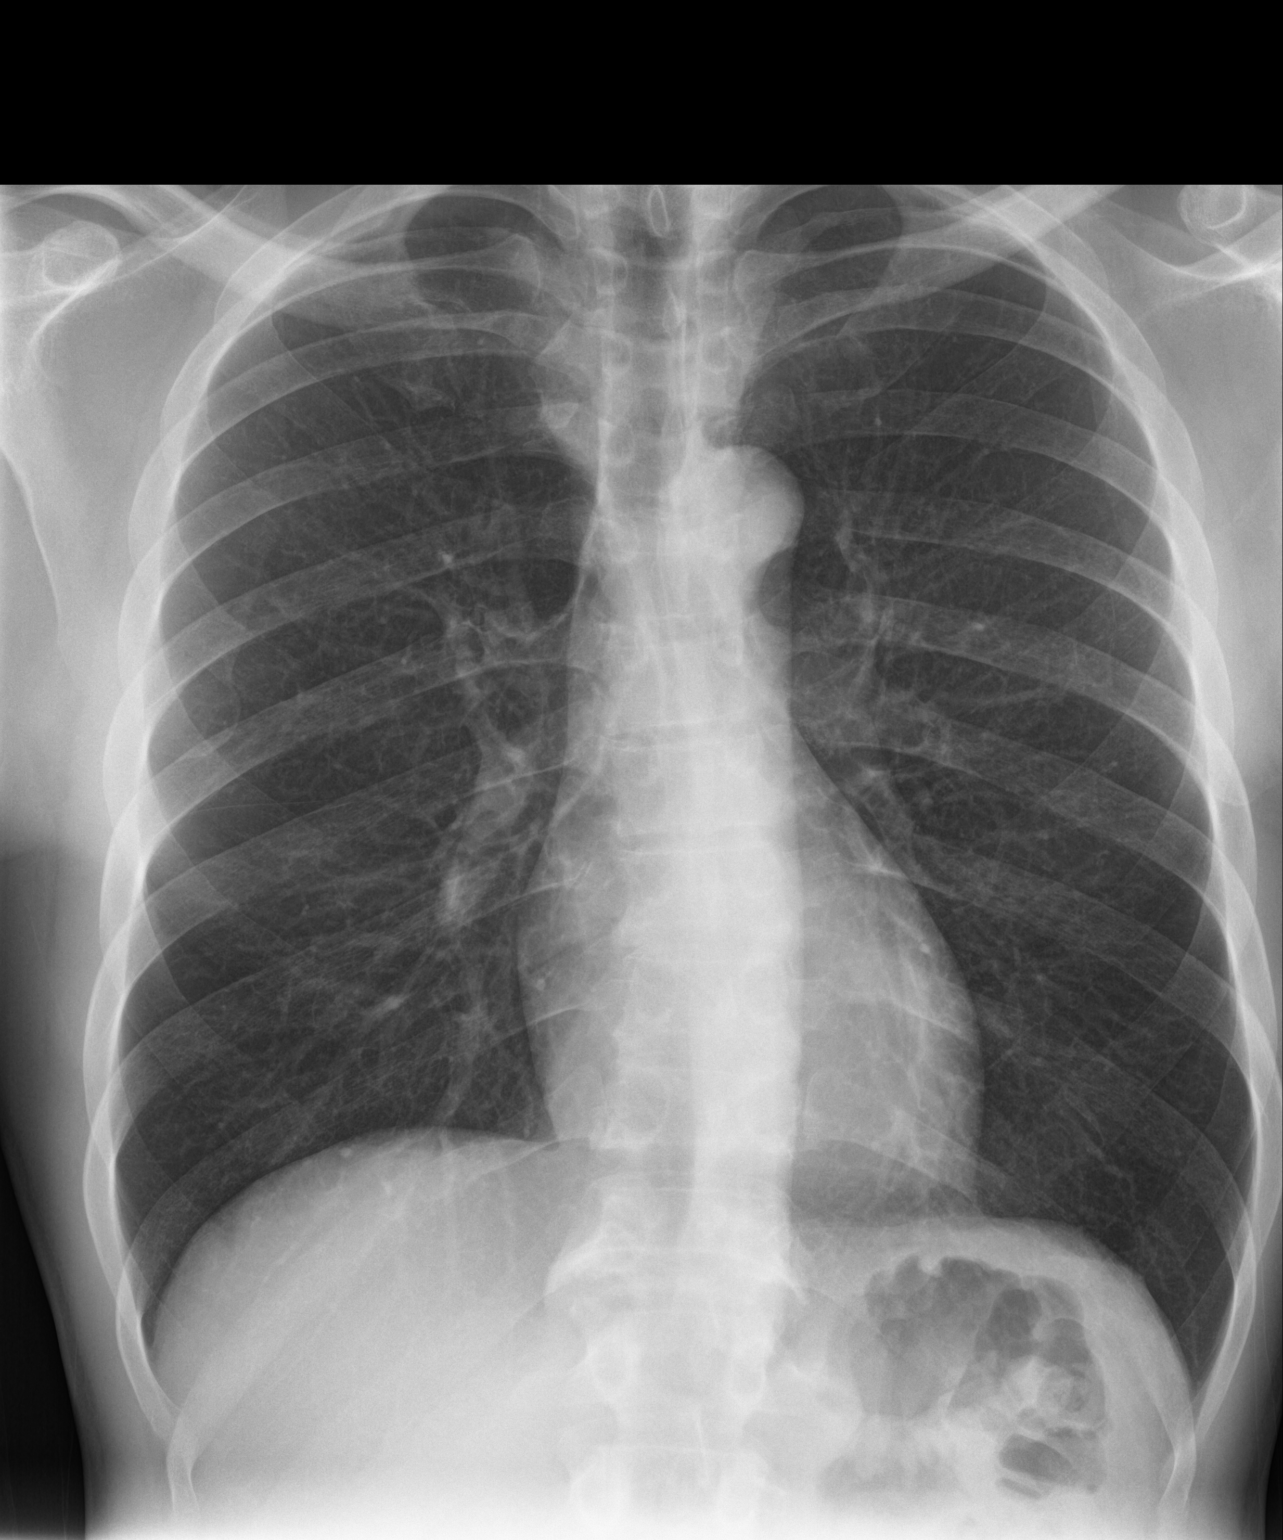

[Series 2: chest lat · 0.14mm/px · 2 of 2 slices shown]
[im 1/2]
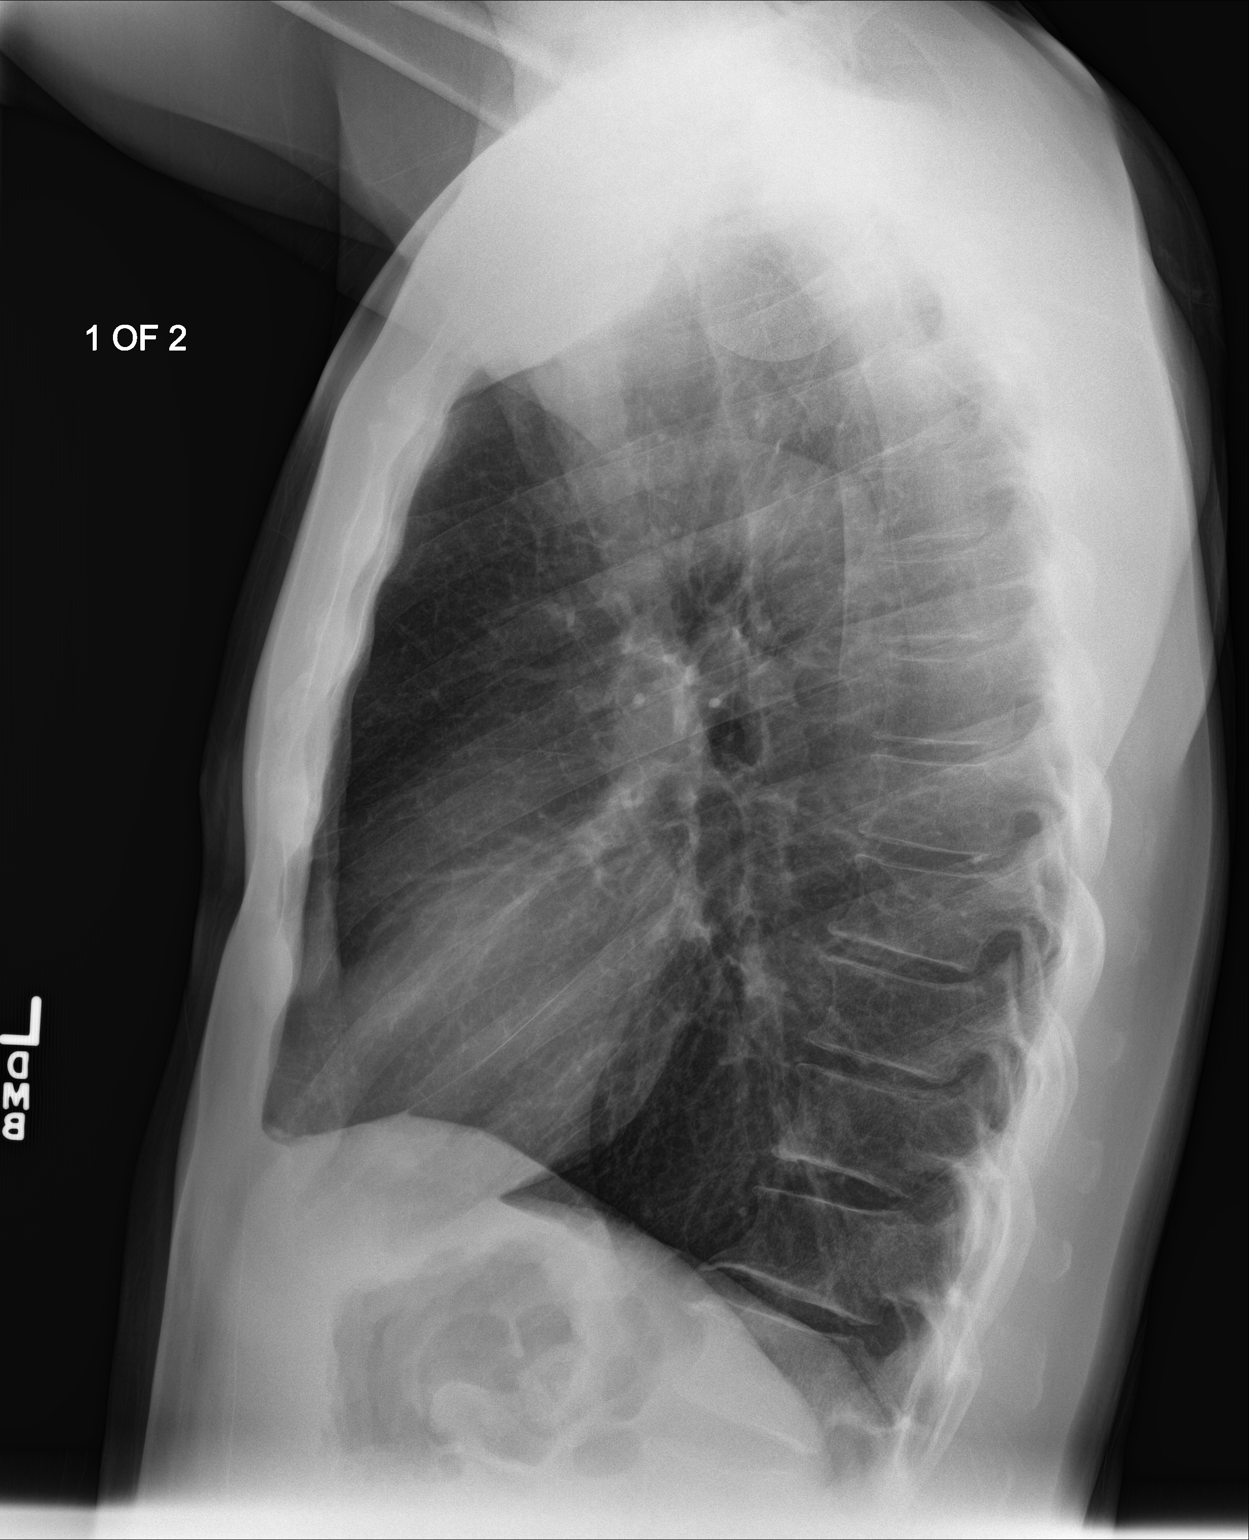
[im 2/2]
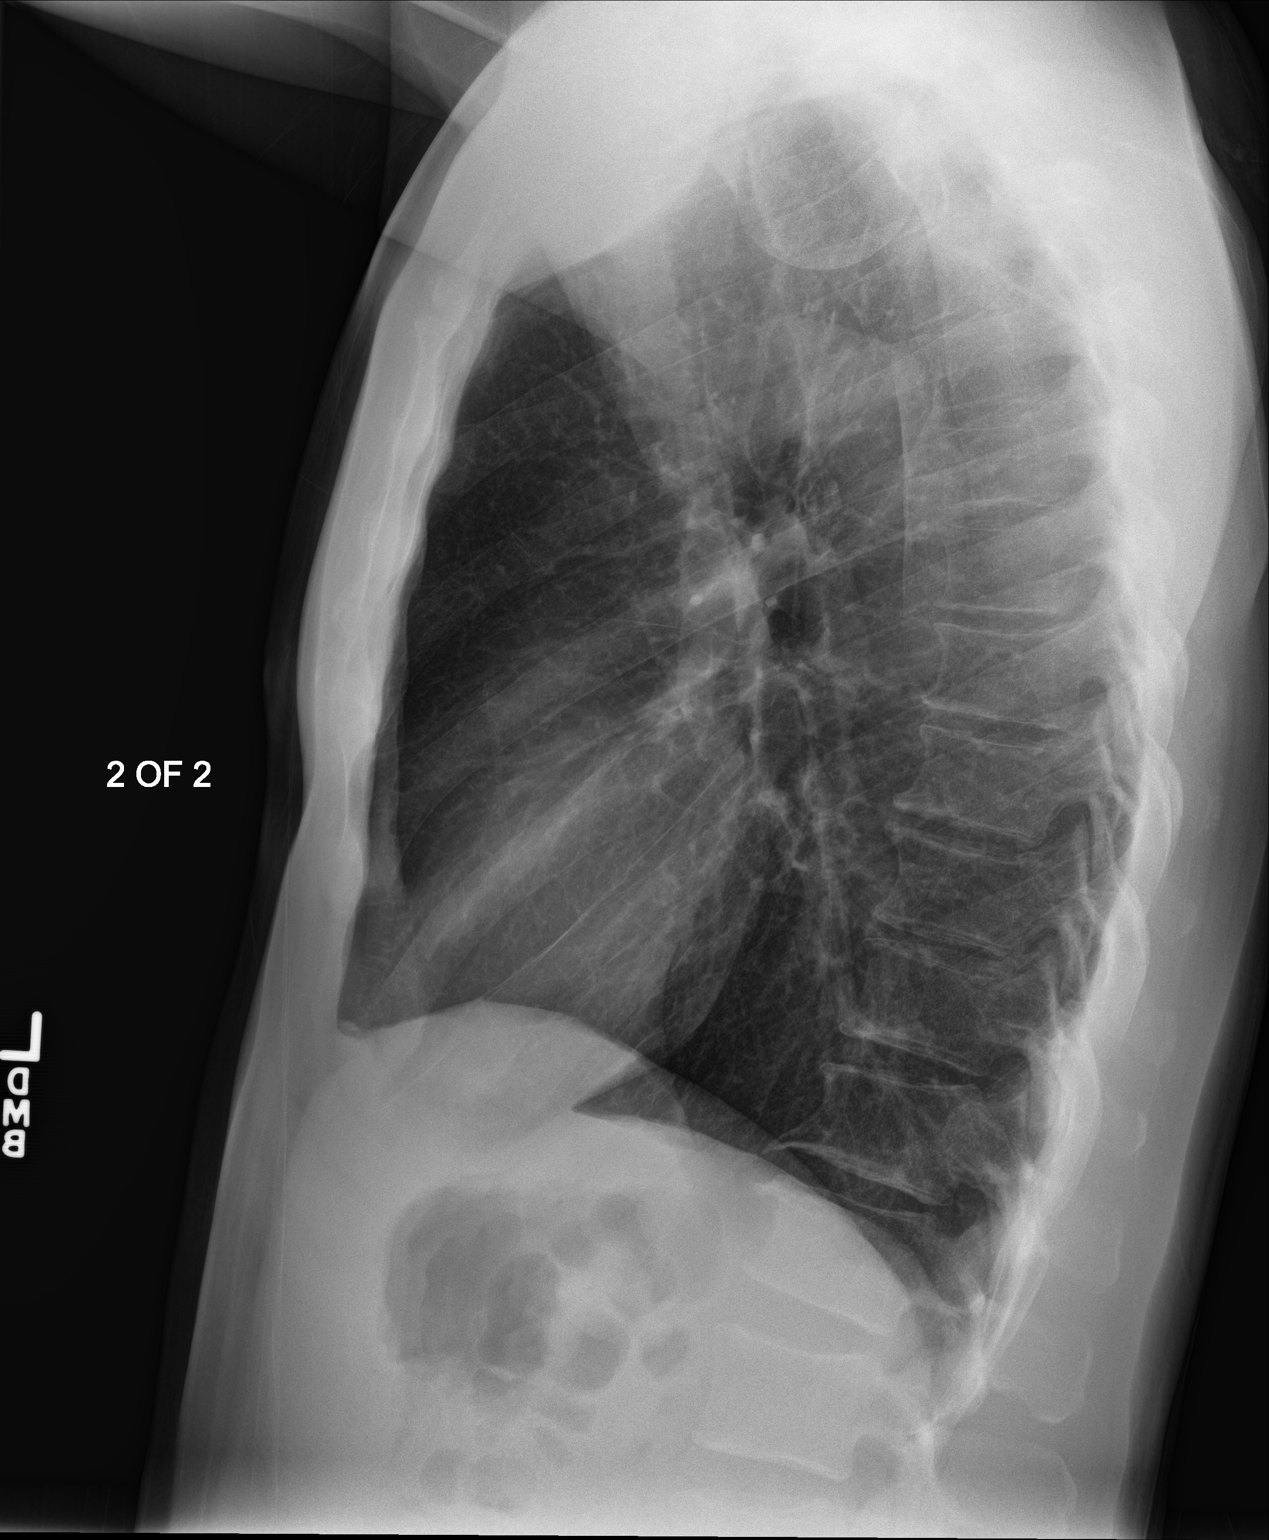

[3 of 3 positions shown; findings below may reference images not displayed]

FINDINGS: The lungs are hyperinflated and clear. The heart and pulmonary
vascularity are normal. There is no pleural effusion or
pneumothorax. The mediastinum is normal in width. The bony thorax
exhibits no acute abnormality. There is chronic gentle levocurvature
of the thoracic spine centered at approximately T10.
IMPRESSION: COPD-reactive airway disease. No alveolar pneumonia, CHF, nor other
acute cardiopulmonary abnormality.

## 2020-06-12 ENCOUNTER — Other Ambulatory Visit: Payer: Self-pay

## 2020-09-11 NOTE — Progress Notes (Signed)
Patient ID: Douglas Abbott., male    DOB: [birth year], 54 y.o.   MRN: [id number]  PCP: Towanda Malkin, MD  Chief Complaint  Patient presents with  . Follow-up    Subjective:   Douglas Abbott. is a 54 y.o. male, presents to clinic with CC of the following:  Chief Complaint  Patient presents with  . Follow-up    HPI:  Patient is a 54 year old male  His last visit with me was 03/09/20 He was a no show for GI appt in August, 2021 Follows up today.  All in all, in the recent past has felt   He had been seen in the emergency room in April 2021 for rectal bleeding.Noted an extensive history of hemorrhoids requiring banding in the past and not on any anticoagulation. Given reassuring work-up and review of his previous colonoscopy showing no evidence of diverticulosis the case was discussed with Dr. Marius Ditch of GI who did agree to follow patient closely in clinic. He was prescribed a suppository as well as MiraLAX and the bleeding was felt likely due to hemorrhoids. He noted no further increased bleeding episodes, has had minimal BRBPR only minimally and many weeks apart when occurs, none in past month, stools not hard   Vitamin D deficiency: Had been on a vitamin D supplement, but not in the recent past. Last vitamin D Lab Results  Component Value Date   VD25OH 18 (L) 11/25/2018   GERD: takes omeprazole int'ly, is helpful when he uses, uses almost daily now  Hyperlipidemia: - States wife eats very healthy and helps him eat healthy. Medication regimen-none Lab Results  Component Value Date   CHOL 192 11/25/2018   HDL 63 11/25/2018   LDLCALC 115 (H) 11/25/2018   TRIG 46 11/25/2018   CHOLHDL 3.0 11/25/2018   Denies any recent chest pains, shortness of breath, palpitations, increased ankle swelling,   Asthma:  Med regimen -  Symbicort 2 puffs BID, albuterol as needed. 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 He is  using albuterol about once a week now.  Denies new coughing, wheezing or awakening at night.  Allergic rhinitis: takes Singulair - has used less regularly in recent past, uses nasonex PRN.  Sx's well controlled presently  Tinea pedis- had used Ketoconazole creamint'ly with relief of symptoms.Not need cream in recent past as has been good  Rectal Bleeding: As above, prior ER visit noted.Denies increased episodes in recent past. If do increase, will need f/u again with GI      Patient Active Problem List   Diagnosis Date Noted  . Tinea pedis of left foot 03/09/2020  . Synovitis and tenosynovitis 05/11/2019  . Hemorrhoids 12/30/2018  . Bradycardia 09/29/2018  . Borderline high cholesterol 06/29/2018  . Vitamin D deficiency 06/29/2018  . Right shoulder pain 09/16/2017  . GERD (gastroesophageal reflux disease) 01/02/2017  . Blood in the urine 09/11/2015  . Asthma, chronic 03/09/2015  . Allergic rhinitis 03/09/2015      Current Outpatient Medications:  .  albuterol (VENTOLIN HFA) 108 (90 Base) MCG/ACT inhaler, Inhale 1-2 puffs into the lungs every 6 (six) hours as needed for wheezing or shortness of breath., Disp: 6.7 g, Rfl: 3 .  budesonide-formoterol (SYMBICORT) 160-4.5 MCG/ACT inhaler, Inhale 2 puffs into the lungs 2 (two) times daily., Disp: 30.6 g, Rfl: 3 .  ketoconazole (NIZORAL) 2 % cream, Apply 1 application topically daily., Disp: 30 g, Rfl: 1 .  montelukast (SINGULAIR)  10 MG tablet, Take 1 tablet (10 mg total) by mouth at bedtime., Disp: 90 tablet, Rfl: 3 .  omeprazole (PRILOSEC) 20 MG capsule, Take 1 capsule (20 mg total) by mouth 2 (two) times daily., Disp: 180 capsule, Rfl: 1   Allergies  Allergen Reactions  . Shellfish Allergy Swelling  . Peanut-Containing Drug Products      Past Surgical History:  Procedure Laterality Date  . COLONOSCOPY WITH PROPOFOL N/A 05/04/2018   Procedure: COLONOSCOPY WITH PROPOFOL;  Surgeon: Jonathon Bellows, MD;  Location: [facility]  ENDOSCOPY;  Service: Gastroenterology;  Laterality: N/A;  . NO PAST SURGERIES       Family History  Problem Relation Age of Onset  . Asthma Mother   . Lung cancer Mother   . Kidney disease Mother   . Heart disease Neg Hx      Social History   Tobacco Use  . Smoking status: Former Smoker    Packs/day: 1.00    Years: 8.00    Pack years: 8.00    Types: Cigarettes    Quit date: 2000    Years since quitting: 21.9  . Smokeless tobacco: Never Used  Substance Use Topics  . Alcohol use: No    Alcohol/week: 0.0 standard drinks    With staff assistance, above reviewed with the patient today.  ROS: As per HPI, otherwise no specific complaints on a limited and focused system review   No results found for this or any previous visit (from the past 72 hour(s)).   PHQ2/9: Depression screen [facility] 2/9 09/12/2020 03/09/2020 07/20/2019 05/03/2019 01/05/2019  Decreased Interest 0 0 0 0 0  Down, Depressed, Hopeless 0 0 0 0 0  PHQ - 2 Score 0 0 0 0 0  Altered sleeping - 0 0 0 0  Tired, decreased energy - 0 0 0 0  Change in appetite - 0 0 0 0  Feeling bad or failure about yourself  - 0 0 0 0  Trouble concentrating - 0 0 0 0  Moving slowly or fidgety/restless - 0 0 0 0  Suicidal thoughts - 0 0 0 0  PHQ-9 Score - 0 0 0 0  Difficult doing work/chores - Not difficult at all Not difficult at all Not difficult at all Not difficult at all  Some recent data might be hidden   PHQ-2/9 Result is neg  Fall Risk: Fall Risk  09/12/2020 03/09/2020 07/20/2019 05/03/2019 01/05/2019  Falls in the past year? 0 0 0 0 0  Number falls in past yr: 0 0 0 0 -  Injury with Fall? 0 0 0 0 -  Follow up - - Falls evaluation completed Falls evaluation completed -      Objective:   Vitals:   09/12/20 0748  BP: 140/86  Pulse: (!) 55  Resp: 16  Temp: 97.6 F (36.4 C)  TempSrc: Oral  SpO2: 98%  Weight: 172 lb 6.4 oz (78.2 kg)  Height: 6\' 2"  (1.88 m)    Body mass index is 22.13 kg/m.  Physical Exam   NAD,  masked, very pleasant HEENT - [place]/AT, sclera anicteric, PERRL, EOMI, conj - non-inj'ed, pharynx clear Neck - supple, no adenopathy, no TM, carotids 2+ and = without bruits bilat Car - RRR without m/g/r, borderline bradycardic with heart rate approximately 60 on my exam Pulm- RR and effort normal at rest, CTA without wheeze or rales Abd - soft, NT diffusely, ND,  Back - no CVA tenderness Ext - no LE edema, Neuro/psychiatric - affect was  not flat, appropriate with conversation             Alert and oriented             Grossly non-focal              Speech normal   Results for orders placed or performed during the hospital encounter of 02/08/20  Lipase, blood  Result Value Ref Range   Lipase 26 11 - 51 U/L  Comprehensive metabolic panel  Result Value Ref Range   Sodium 138 135 - 145 mmol/L   Potassium 4.0 3.5 - 5.1 mmol/L   Chloride 105 98 - 111 mmol/L   CO2 26 22 - 32 mmol/L   Glucose, Bld 101 (H) 70 - 99 mg/dL   BUN 18 6 - 20 mg/dL   Creatinine, Ser 1.16 0.61 - 1.24 mg/dL   Calcium 9.4 8.9 - 10.3 mg/dL   Total Protein 7.5 6.5 - 8.1 g/dL   Albumin 4.5 3.5 - 5.0 g/dL   AST 21 15 - 41 U/L   ALT 21 0 - 44 U/L   Alkaline Phosphatase 119 38 - 126 U/L   Total Bilirubin 0.9 0.3 - 1.2 mg/dL   GFR calc non Af Amer >60 >60 mL/min   GFR calc Af Amer >60 >60 mL/min   Anion gap 7 5 - 15  CBC  Result Value Ref Range   WBC 5.7 4.0 - 10.5 K/uL   RBC 4.65 4.22 - 5.81 MIL/uL   Hemoglobin 14.3 13.0 - 17.0 g/dL   HCT 42.3 39 - 52 %   MCV 91.0 80.0 - 100.0 fL   MCH 30.8 26.0 - 34.0 pg   MCHC 33.8 30.0 - 36.0 g/dL   RDW 14.0 11.5 - 15.5 %   Platelets 335 150 - 400 K/uL   nRBC 0.0 0.0 - 0.2 %    Last labs reviewed Assessment & Plan:    1. Tinea pedis of left foot Emphasized keeping the areas clean and dry, also can use the topical antifungal cream intermittently when more problematic  Has remained well managed  2. Moderate persistent chronic asthma without complication Continue the  Symbicort regularly and the albuterol prn, sx's remain well controlled Also notes that Singulair has not been needed as much in the recent past..  3. BRBPR (bright red blood per rectum) No recent increases noted  He is aware will need to f/u with G/I if sx's again increasing  4. Seasonal allergic rhinitis due to other allergic trigger As done well with the Singulair nightly and a Nasonex product intermittently in the past, has used the Singulair less regularly recently  5. Gastroesophageal reflux disease, unspecified whether esophagitis present He takes the omeprazole more regularly now, and is helpful when he takes. Also aware of reflux precautions to help manage Noted the long term use of PPIs and concerns with that that can be problematic over time, and trying to use the omeprazole less frequently recommended Continue the intermittent use of the omeprazole presently.  6. Borderline high cholesterol Reviewed, and his last labs were in February 2020, mild increase in LDL noted Agreed to have a f/u in February 2022 to recheck lab panel again Recommended continuing to eat healthy as he has been trying to do in recent past  7. Vitamin D deficiency We will check of the vitamin D level on his follow-up when we recheck labs.   F/u in 3 months, sooner prn and come fasting on f/u visit, as  plan to recheck labs on that f/u visit He was made aware the f/u will be with a new provider as I will leaving the practice prior to that f/u planned. Marland Kitchen      Towanda Malkin, MD 09/12/20 7:56 AM

## 2020-09-12 ENCOUNTER — Encounter: Payer: Self-pay | Admitting: Internal Medicine

## 2020-09-12 ENCOUNTER — Other Ambulatory Visit: Payer: Self-pay

## 2020-09-12 ENCOUNTER — Ambulatory Visit (INDEPENDENT_AMBULATORY_CARE_PROVIDER_SITE_OTHER): Payer: 59 | Admitting: Internal Medicine

## 2020-09-12 VITALS — BP 140/86 | HR 55 | Temp 97.6°F | Resp 16 | Ht 74.0 in | Wt 172.4 lb

## 2020-09-12 DIAGNOSIS — Z23 Encounter for immunization: Secondary | ICD-10-CM | POA: Diagnosis not present

## 2020-09-12 DIAGNOSIS — E789 Disorder of lipoprotein metabolism, unspecified: Secondary | ICD-10-CM

## 2020-09-12 DIAGNOSIS — J4540 Moderate persistent asthma, uncomplicated: Secondary | ICD-10-CM | POA: Diagnosis not present

## 2020-09-12 DIAGNOSIS — K219 Gastro-esophageal reflux disease without esophagitis: Secondary | ICD-10-CM

## 2020-09-12 DIAGNOSIS — B353 Tinea pedis: Secondary | ICD-10-CM | POA: Diagnosis not present

## 2020-09-12 DIAGNOSIS — J3089 Other allergic rhinitis: Secondary | ICD-10-CM | POA: Diagnosis not present

## 2020-09-12 DIAGNOSIS — E559 Vitamin D deficiency, unspecified: Secondary | ICD-10-CM

## 2020-09-12 DIAGNOSIS — K625 Hemorrhage of anus and rectum: Secondary | ICD-10-CM

## 2020-09-12 NOTE — Patient Instructions (Signed)

## 2020-11-13 ENCOUNTER — Encounter: Payer: Self-pay | Admitting: Intensive Care

## 2020-11-13 ENCOUNTER — Other Ambulatory Visit: Payer: Self-pay

## 2020-11-13 ENCOUNTER — Emergency Department
Admission: EM | Admit: 2020-11-13 | Discharge: 2020-11-13 | Disposition: A | Discharge status: Home / Self Care | Payer: 59 | Attending: Emergency Medicine | Admitting: Emergency Medicine

## 2020-11-13 ENCOUNTER — Emergency Department: Payer: 59

## 2020-11-13 DIAGNOSIS — J45909 Unspecified asthma, uncomplicated: Secondary | ICD-10-CM | POA: Insufficient documentation

## 2020-11-13 DIAGNOSIS — S61411A Laceration without foreign body of right hand, initial encounter: Secondary | ICD-10-CM | POA: Diagnosis not present

## 2020-11-13 DIAGNOSIS — Z91010 Allergy to peanuts: Secondary | ICD-10-CM | POA: Insufficient documentation

## 2020-11-13 DIAGNOSIS — Z23 Encounter for immunization: Secondary | ICD-10-CM | POA: Diagnosis not present

## 2020-11-13 DIAGNOSIS — W11XXXA Fall on and from ladder, initial encounter: Secondary | ICD-10-CM | POA: Insufficient documentation

## 2020-11-13 DIAGNOSIS — Y9389 Activity, other specified: Secondary | ICD-10-CM | POA: Diagnosis not present

## 2020-11-13 DIAGNOSIS — Z7951 Long term (current) use of inhaled steroids: Secondary | ICD-10-CM | POA: Diagnosis not present

## 2020-11-13 DIAGNOSIS — Y92019 Unspecified place in single-family (private) house as the place of occurrence of the external cause: Secondary | ICD-10-CM | POA: Diagnosis not present

## 2020-11-13 DIAGNOSIS — S6991XA Unspecified injury of right wrist, hand and finger(s), initial encounter: Secondary | ICD-10-CM | POA: Diagnosis present

## 2020-11-13 MED ORDER — TETANUS-DIPHTH-ACELL PERTUSSIS 5-2.5-18.5 LF-MCG/0.5 IM SUSY
0.5000 mL | PREFILLED_SYRINGE | Freq: Once | INTRAMUSCULAR | Status: AC
  Administered 2020-11-13: 0.5 mL via INTRAMUSCULAR
  Filled 2020-11-13: qty 0.5

## 2020-11-13 MED ORDER — BACITRACIN ZINC 500 UNIT/GM EX OINT: TOPICAL_OINTMENT | Freq: Once | CUTANEOUS | Status: AC

## 2020-11-13 MED ORDER — OXYCODONE-ACETAMINOPHEN 5-325 MG PO TABS
1.0000 | ORAL_TABLET | ORAL | Status: AC | PRN
  Administered 2020-11-13 (×2): 1 via ORAL
  Filled 2020-11-13 (×3): qty 1

## 2020-11-13 MED ORDER — LIDOCAINE-EPINEPHRINE 2 %-1:100000 IJ SOLN
20.0000 mL | Freq: Once | INTRAMUSCULAR | Status: AC
  Administered 2020-11-13: 20 mL
  Filled 2020-11-13: qty 1

## 2020-11-13 MED ORDER — IBUPROFEN 600 MG PO TABS
600.0000 mg | ORAL_TABLET | Freq: Once | ORAL | Status: AC
  Administered 2020-11-13: 600 mg via ORAL
  Filled 2020-11-13: qty 1

## 2020-11-13 NOTE — ED Provider Notes (Signed)
[facility] Emergency Department Provider Note ____________________________________________   Event Date/Time   First MD Initiated Contact with Patient 11/13/20 1817     (approximate)  I have reviewed the triage vital signs and the nursing notes.  HISTORY  Chief Complaint Extremity Laceration   HPI Douglas Abbott. is a 55 y.o. malewho presents to the ED for evaluation of right palmar laceration.  Chart review indicates history of asthma.  No thinners.  Patient reports being on a ladder to work on a ceiling fan at his home, when he lost balance falling to the side and catching himself with outstretched right hand/arm. Patient reports this hand striking a metal bar and causing injury and laceration. This occurred just prior to arrival.  Patient is uncertain of his last tetanus booster. He is right-hand dominant. No further injuries, head trauma, syncope or signs of pain beyond his right hand.  Past Medical History:  Diagnosis Date  . Allergic rhinitis   . Allergy   . Asthma   . BRBPR (bright red blood per rectum) 03/27/2018  . Decreased libido   . History of pleurisy   . Sinus bradycardia    a. 03/2017 Echo: Ef 50-55%.    Patient Active Problem List   Diagnosis Date Noted  . Tinea pedis of left foot 03/09/2020  . Synovitis and tenosynovitis 05/11/2019  . Hemorrhoids 12/30/2018  . Bradycardia 09/29/2018  . Borderline high cholesterol 06/29/2018  . Vitamin D deficiency 06/29/2018  . Right shoulder pain 09/16/2017  . GERD (gastroesophageal reflux disease) 01/02/2017  . Blood in the urine 09/11/2015  . Asthma, chronic 03/09/2015  . Allergic rhinitis 03/09/2015    Past Surgical History:  Procedure Laterality Date  . COLONOSCOPY WITH PROPOFOL N/A 05/04/2018   Procedure: COLONOSCOPY WITH PROPOFOL;  Surgeon: Jonathon Bellows, MD;  Location: [facility] ENDOSCOPY;  Service: Gastroenterology;  Laterality: N/A;  . NO PAST SURGERIES      Prior to Admission  medications   Medication Sig Start Date End Date Taking? Authorizing Provider  albuterol (VENTOLIN HFA) 108 (90 Base) MCG/ACT inhaler Inhale 1-2 puffs into the lungs every 6 (six) hours as needed for wheezing or shortness of breath. 03/09/20   Towanda Malkin, MD  budesonide-formoterol (SYMBICORT) 160-4.5 MCG/ACT inhaler Inhale 2 puffs into the lungs 2 (two) times daily. 06/07/20   Towanda Malkin, MD  ketoconazole (NIZORAL) 2 % cream Apply 1 application topically daily. 03/09/20   Towanda Malkin, MD  montelukast (SINGULAIR) 10 MG tablet Take 1 tablet (10 mg total) by mouth at bedtime. 03/27/18   Arnetha Courser, MD  omeprazole (PRILOSEC) 20 MG capsule Take 1 capsule (20 mg total) by mouth 2 (two) times daily. 06/07/20   Towanda Malkin, MD    Allergies Shellfish allergy and Peanut-containing drug products  Family History  Problem Relation Age of Onset  . Asthma Mother   . Lung cancer Mother   . Kidney disease Mother   . Heart disease Neg Hx     Social History Social History   Tobacco Use  . Smoking status: Former Smoker    Packs/day: 1.00    Years: 8.00    Pack years: 8.00    Types: Cigarettes    Quit date: 2000    Years since quitting: 22.0  . Smokeless tobacco: Never Used  Vaping Use  . Vaping Use: Never used  Substance Use Topics  . Alcohol use: Yes    Alcohol/week: 0.0 standard drinks  Comment: occ  . Drug use: Yes    Frequency: 1.0 times per week    Types: Marijuana    Comment: occasional marijuana use    Review of Systems  Constitutional: No fever/chills Eyes: No visual changes. ENT: No sore throat. Cardiovascular: Denies chest pain. Respiratory: Denies shortness of breath. Gastrointestinal: No abdominal pain.  No nausea, no vomiting.  No diarrhea.  No constipation. Genitourinary: Negative for dysuria. Musculoskeletal: Negative for back pain. Right hand injury and pain. Skin: Negative for rash. Neurological: Negative for  headaches, focal weakness or numbness.  ____________________________________________   PHYSICAL EXAM:  VITAL SIGNS: Vitals:   11/13/20 1752  BP: 105/82  Pulse: 88  Resp: 18  Temp: 99.2 F (37.3 C)  SpO2: 98%     Constitutional: Alert and oriented. Well appearing and in no acute distress. Eyes: Conjunctivae are normal. PERRL. EOMI. Head: Atraumatic. Nose: No congestion/rhinnorhea. Mouth/Throat: Mucous membranes are moist.  Oropharynx non-erythematous. Neck: No stridor. No cervical spine tenderness to palpation. Cardiovascular: Normal rate, regular rhythm. Grossly normal heart sounds.  Good peripheral circulation. Respiratory: Normal respiratory effort.  No retractions. Lungs CTAB. Gastrointestinal: Soft , nondistended, nontender to palpation. No CVA tenderness. Musculoskeletal: No lower extremity tenderness nor edema.  No joint effusions.  Transverse laceration to the radial and palmar aspect of his right hand, inspected below. About 3 cm in length. Extends into the subcutaneous tissue I can visualize his flexor digitorum tendons. Full range of motion and strength with flexion and extension, and no notable transections or injuries to these flexor tendons. Hemostatic with direct pressure. Distally neurovascularly intact. Neurologic:  Normal speech and language. No gross focal neurologic deficits are appreciated. No gait instability noted. Skin:  Skin is warm, dry. No rash noted. Psychiatric: Mood and affect are normal. Speech and behavior are normal.     ____________________________________________  RADIOLOGY  ED MD interpretation: Plain film of the right hand reviewed by me without evidence of bony injury or retained foreign bodies.  Official radiology report(s): DG Hand Complete Right  Result Date: 11/13/2020 CLINICAL DATA:  55 year old male with laceration of the right palm. EXAM: RIGHT HAND - COMPLETE 3+ VIEW COMPARISON:  Right third digit radiograph dated 03/31/2009.  FINDINGS: There is no acute fracture or dislocation. Fixation screws through the middle phalanx of the third digit as seen on the prior radiograph. Mild osteopenia. Degenerative changes of the wrist. The soft tissues are unremarkable. IMPRESSION: No acute fracture or dislocation. Electronically Signed   By: Anner Crete M.D.   On: 11/13/2020 18:26    ____________________________________________   PROCEDURES and INTERVENTIONS  Procedure(s) performed (including Critical Care):  Marland KitchenMarland KitchenLaceration Repair  Date/Time: 11/13/2020 9:01 PM Performed by: Vladimir Crofts, MD Authorized by: Vladimir Crofts, MD   Consent:    Consent obtained:  Verbal   Consent given by:  Patient   Risks, benefits, and alternatives were discussed: yes     Risks discussed:  Infection, pain, poor cosmetic result, retained foreign body and need for additional repair Anesthesia:    Anesthesia method:  Local infiltration   Local anesthetic:  Lidocaine 1% WITH epi Laceration details:    Location:  Hand   Hand location:  R palm   Length (cm):  3 Pre-procedure details:    Preparation:  Patient was prepped and draped in usual sterile fashion Exploration:    Hemostasis achieved with:  Epinephrine and direct pressure   Imaging obtained: x-ray     Imaging outcome: foreign body not noted     Wound  exploration: wound explored through full range of motion     Wound extent: no tendon damage noted and no underlying fracture noted     Contaminated: no   Treatment:    Area cleansed with:  Povidone-iodine   Amount of cleaning:  Standard   Irrigation solution:  Sterile saline   Irrigation volume:  500   Irrigation method:  Pressure wash   Visualized foreign bodies/material removed: no     Debridement:  None   Undermining:  None Skin repair:    Repair method:  Sutures   Suture size:  4-0   Suture material:  Nylon   Suture technique:  Simple interrupted   Number of sutures:  6 Approximation:    Approximation:  Close Repair  type:    Repair type:  Simple Post-procedure details:    Dressing:  Antibiotic ointment, non-adherent dressing and splint for protection   Procedure completion:  Tolerated well, no immediate complications    Medications  oxyCODONE-acetaminophen (PERCOCET/ROXICET) 5-325 MG per tablet 1 tablet (1 tablet Oral Given 11/13/20 1800)  bacitracin ointment (has no administration in time range)  ibuprofen (ADVIL) tablet 600 mg (600 mg Oral Given 11/13/20 2019)  lidocaine-EPINEPHrine (XYLOCAINE W/EPI) 2 %-1:100000 (with pres) injection 20 mL (20 mLs Infiltration Given 11/13/20 2019)  Tdap (BOOSTRIX) injection 0.5 mL (0.5 mLs Intramuscular Given 11/13/20 2016)    ____________________________________________   MDM / ED COURSE   Fairly healthy 55 year old male presents to the ED after an accidental fall causing a palm laceration, requiring bedside repair, and subsequently amenable to outpatient management. Normal vitals on room air. Exam with an isolated transverse laceration to his right palmar hand, as pictured above. No evidence of tendinous involvement, laceration or disruption. No evidence of foreign body or underlying fracture. Repaired, as above, without complication. Tdap provided. Provided splint to ensure limited ROM as this heals. Return precautions for the ED were discussed prior to discharge.   Clinical Course as of 11/13/20 2107  Mon Nov 13, 2020  2057 Lac repair complete. Educated patient on outpatient management and return precautions for the ED as this was being performed. Provided a splint to reduce flexion/distention motion with stress the sutures. [DS]    Clinical Course User Index [DS] Vladimir Crofts, MD    ____________________________________________   FINAL CLINICAL IMPRESSION(S) / ED DIAGNOSES  Final diagnoses:  Laceration of palm, right, initial encounter     ED Discharge Orders    None       Maliha Outten Tamala Julian   Note:  This document was prepared using Dragon voice  recognition software and may include unintentional dictation errors.   Vladimir Crofts, MD 11/13/20 2108

## 2020-11-13 NOTE — ED Triage Notes (Signed)
Patient presents with right palm laceration

## 2020-11-13 NOTE — Discharge Instructions (Addendum)
Please take Tylenol and ibuprofen/Advil for your pain.  It is safe to take them together, or to alternate them every few hours.  Take up to 1000mg  of Tylenol at a time, up to 4 times per day.  Do not take more than 4000 mg of Tylenol in 24 hours.  For ibuprofen, take 400-600 mg, 4-5 times per day.  In general, keep the area clean and dry. Then apply a Bacitracin/Neosporin type of antibiotic ointment, or Vaseline.  Gently wash with warm soap and water once daily, and again if it gets dirty. Don't vigorously scrub at the wound.  Gently pat dry. Once dry, apply Neosporin / bacitracin type antibiotic ointment. It is important to keep the wound moist with an antibiotic ointment, or a small amount of Vaseline, for the first 5 days. Keeping it moist/clean will help the area heal faster and prevent scar tissue formation.  If you are doing anything active, keep it covered.  If you are sitting around at home, you may leave it uncovered.  Use the Velcro splint that we provided to make sure you do not stretch or extend the skin too much that would repeat the stitches.  We placed 6 stitches that will not absorb on their own. These will need to be removed in about 10 days. This can be done at the local walk-in clinic, any urgent care or ED.  If you develop any fevers, pus coming from this wound or spreading red rash, please return to the ED

## 2020-11-22 ENCOUNTER — Other Ambulatory Visit: Payer: Self-pay

## 2020-11-22 ENCOUNTER — Ambulatory Visit (INDEPENDENT_AMBULATORY_CARE_PROVIDER_SITE_OTHER): Payer: 59 | Admitting: Family Medicine

## 2020-11-22 ENCOUNTER — Encounter: Payer: Self-pay | Admitting: Family Medicine

## 2020-11-22 VITALS — BP 116/62 | HR 87 | Temp 98.3°F | Resp 16 | Ht 75.0 in | Wt 173.3 lb

## 2020-11-22 DIAGNOSIS — Z4802 Encounter for removal of sutures: Secondary | ICD-10-CM | POA: Diagnosis not present

## 2020-11-22 DIAGNOSIS — R202 Paresthesia of skin: Secondary | ICD-10-CM
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
## 2021-02-08 ENCOUNTER — Encounter: Payer: Self-pay | Admitting: Family Medicine

## 2021-02-08 ENCOUNTER — Ambulatory Visit: Payer: Self-pay | Admitting: Family Medicine

## 2021-02-08 ENCOUNTER — Other Ambulatory Visit: Payer: Self-pay

## 2021-02-08 VITALS — BP 124/82 | HR 72 | Temp 98.2°F | Resp 18 | Ht 75.0 in | Wt 174.0 lb

## 2021-02-08 DIAGNOSIS — S42102D Fracture of unspecified part of scapula, left shoulder, subsequent encounter for fracture with routine healing: Secondary | ICD-10-CM
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
## 2021-06-07 ENCOUNTER — Telehealth: Payer: Self-pay

## 2021-06-07 NOTE — Telephone Encounter (Signed)
Copied from [place] [id number]. Topic: General - Other >> Jun 07, 2021 11:51 AM Tessa Lerner A wrote: Reason for CRM: Anderson Malta with [facility] has called with concerns related to the prior auth of patient's prescription for omeprazole (PRILOSEC) 20 MG capsule [id number]   Anderson Malta is aware that the patient has been directed to take the medication twice daily and would like to know if the patient has previously attempted taking the medication once a day   Please contact to further discuss patient's treatment history with the medication when possible  The case reference number is [id number]

## 2021-06-13 NOTE — Telephone Encounter (Signed)
This med has not been recently prescribed

## 2021-07-23 ENCOUNTER — Ambulatory Visit (INDEPENDENT_AMBULATORY_CARE_PROVIDER_SITE_OTHER): Payer: 59 | Admitting: Family Medicine

## 2021-07-23 ENCOUNTER — Other Ambulatory Visit: Payer: Self-pay

## 2021-07-23 ENCOUNTER — Other Ambulatory Visit: Payer: Self-pay | Admitting: Family Medicine

## 2021-07-23 ENCOUNTER — Encounter: Payer: Self-pay | Admitting: Family Medicine

## 2021-07-23 VITALS — BP 128/84 | HR 80 | Temp 98.3°F | Resp 16 | Ht 75.0 in | Wt 168.9 lb

## 2021-07-23 DIAGNOSIS — Z1159 Encounter for screening for other viral diseases: Secondary | ICD-10-CM

## 2021-07-23 DIAGNOSIS — Z23 Encounter for immunization: Secondary | ICD-10-CM | POA: Diagnosis not present

## 2021-07-23 DIAGNOSIS — J4540 Moderate persistent asthma, uncomplicated: Secondary | ICD-10-CM | POA: Diagnosis not present

## 2021-07-23 DIAGNOSIS — E785 Hyperlipidemia, unspecified: Secondary | ICD-10-CM | POA: Diagnosis not present

## 2021-07-23 DIAGNOSIS — R3916 Straining to void: Secondary | ICD-10-CM | POA: Diagnosis not present

## 2021-07-23 DIAGNOSIS — R490 Dysphonia: Secondary | ICD-10-CM | POA: Insufficient documentation

## 2021-07-23 LAB — POCT URINALYSIS DIPSTICK
Bilirubin, UA: NEGATIVE
Glucose, UA: NEGATIVE
Ketones, UA: NEGATIVE
Leukocytes, UA: NEGATIVE
Nitrite, UA: NEGATIVE
Odor: NORMAL
Protein, UA: NEGATIVE
Spec Grav, UA: 1.01 (ref 1.010–1.025)
Urobilinogen, UA: 0.2 E.U./dL
pH, UA: 6 (ref 5.0–8.0)

## 2021-07-23 MED ORDER — BUDESONIDE-FORMOTEROL FUMARATE 160-4.5 MCG/ACT IN AERO: 2.0000 | INHALATION_SPRAY | Freq: Two times a day (BID) | RESPIRATORY_TRACT | 3 refills | Status: DC

## 2021-07-23 MED ORDER — TAMSULOSIN HCL 0.4 MG PO CAPS: 0.4000 mg | ORAL_CAPSULE | Freq: Every day | ORAL | 0 refills | Status: DC

## 2021-07-23 MED ORDER — ALBUTEROL SULFATE HFA 108 (90 BASE) MCG/ACT IN AERS: 1.0000 | INHALATION_SPRAY | Freq: Four times a day (QID) | RESPIRATORY_TRACT | 3 refills | Status: DC | PRN

## 2021-07-23 NOTE — Assessment & Plan Note (Signed)
Likely 2/2 BPH given history and enlarged prostate on exam, trial flomax. Will obtain UA given h/o hematuria. Will also obtain PSA and BMP to rule out malignancy and assess degree of obstruction.

## 2021-07-23 NOTE — Progress Notes (Signed)
   SUBJECTIVE:   CHIEF COMPLAINT / HPI:   Asthma, Allergic rhinitis - Medications: albuterol PRN, singulair, symbicort - Taking: daily symbicort. Forgets singulair a lot.  - Common triggers: working around [employer], mowing grass/environmental allergies - ED visits/hospitalization in the last 6 months: no - has seen allergist in the past.   URINARY SYMPTOMS Duration: months, progressively worse Dysuria: no Urinary frequency: yes Urgency: yes Small volume voids: yes Urinary incontinence: no Foul odor:  occasionally when drinking [employer] Dew Hematuria: no Abdominal pain: no Back pain: no Fever:  no Vomiting: no Relief with cranberry juice:  hasn't tried Relief with pyridium:  hasn't tried Previous urinary tract infection: no Recurrent urinary tract infection: no Sexual activity: monogomous History of sexually transmitted disease: yes, treated Penile discharge: no Treatments attempted: none No known FH of prostate cancer.  Prior normal PSA.  Throat - has FH of polyps in throat. Recently noticing hoarse voice and thinks he may have polyps. No trouble swallowing or breathing. Has noticed cracking in voice more recently when singing.  No increase in phlegm. On PPI. No nighttime coughing. No worsening of asthma.   OBJECTIVE:   BP 128/84   Pulse 80   Temp 98.3 F (36.8 C) (Oral)   Resp 16   Ht 6\' 3"  (1.905 m)   Wt 168 lb 14.4 oz (76.6 kg)   SpO2 97%   BMI 21.11 kg/m   Gen: well appearing, in NAD HEENT: oropharynx clear without erythema, exudate, lesions. Uvula midline. No tonsillar hypertrophy. Good dentition.  Card: RRR Lungs: CTAB Rectal: enlarged prostate, smooth, firm. No nodules appreciated. Ext: WWP, no edema   ASSESSMENT/PLAN:   Asthma, chronic Doing well on current regimen, encouraged compliance with singulair, no changes made today.  Urinary straining Likely 2/2 BPH given history and enlarged prostate on exam, trial flomax. Will obtain UA given h/o  hematuria. Will also obtain PSA and BMP to rule out malignancy and assess degree of obstruction.   Hoarse voice quality Oropharynx exam wnl. Symptoms not consistent with GERD and currently on PPI. Former smoker. Has FH of polyps, refer to ENT for closer examination and workup.      Myles Gip, DO

## 2021-07-23 NOTE — Assessment & Plan Note (Signed)
Doing well on current regimen, encouraged compliance with singulair, no changes made today.

## 2021-07-23 NOTE — Assessment & Plan Note (Signed)
Oropharynx exam wnl. Symptoms not consistent with GERD and currently on PPI. Former smoker. Has FH of polyps, refer to ENT for closer examination and workup.

## 2021-07-23 NOTE — Patient Instructions (Signed)
It was great to see you!  Our plans for today:  - We sent refills of your medications to the pharmacy. - Try the tamsulosin for your urine.  - We referred you to the Ear, Nose, and Throat doctor for your hoarse voice. Let us know if you don't hear about an appointment in the next few weeks.   We are checking some labs today, we will release these results to your MyChart.  Take care and seek immediate care sooner if you develop any concerns.   Dr. Ky Barban

## 2021-07-24 LAB — BASIC METABOLIC PANEL
BUN: 16 mg/dL (ref 7–25)
CO2: 28 mmol/L (ref 20–32)
Calcium: 9.6 mg/dL (ref 8.6–10.3)
Chloride: 106 mmol/L (ref 98–110)
Creat: 1.07 mg/dL (ref 0.70–1.30)
Glucose, Bld: 95 mg/dL (ref 65–99)
Potassium: 4.2 mmol/L (ref 3.5–5.3)
Sodium: 139 mmol/L (ref 135–146)

## 2021-07-24 LAB — HEPATITIS C ANTIBODY
Hepatitis C Ab: NONREACTIVE
SIGNAL TO CUT-OFF: <0.02 (ref <1.00)

## 2021-07-24 LAB — LIPID PANEL
Cholesterol: 198 mg/dL (ref <200)
HDL: 69 mg/dL (ref >=40)
LDL Cholesterol (Calc): 114 mg/dL (calc) — ABNORMAL HIGH
Non-HDL Cholesterol (Calc): 129 mg/dL (calc) (ref <130)
Total CHOL/HDL Ratio: 2.9 (calc) (ref <5.0)
Triglycerides: 68 mg/dL (ref <150)

## 2021-07-24 LAB — PSA: PSA: 1.32 ng/mL (ref <=4.00)

## 2021-10-20 ENCOUNTER — Other Ambulatory Visit: Payer: Self-pay | Admitting: Family Medicine
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
## 2021-11-02 ENCOUNTER — Encounter: Payer: Self-pay | Admitting: Nurse Practitioner

## 2021-11-02 ENCOUNTER — Ambulatory Visit (INDEPENDENT_AMBULATORY_CARE_PROVIDER_SITE_OTHER): Payer: 59 | Admitting: Nurse Practitioner

## 2021-11-02 ENCOUNTER — Other Ambulatory Visit: Payer: Self-pay

## 2021-11-02 VITALS — BP 138/72 | HR 76 | Temp 98.2°F | Resp 18 | Ht 75.0 in | Wt 169.0 lb

## 2021-11-02 DIAGNOSIS — J4541 Moderate persistent asthma with (acute) exacerbation: Secondary | ICD-10-CM

## 2021-11-02 DIAGNOSIS — J069 Acute upper respiratory infection, unspecified: Secondary | ICD-10-CM

## 2021-11-02 LAB — POCT INFLUENZA A/B
Influenza A, POC: NEGATIVE
Influenza B, POC: NEGATIVE

## 2021-11-02 MED ORDER — PREDNISONE 10 MG PO TABS: ORAL_TABLET | ORAL | 0 refills | Status: DC

## 2021-11-02 MED ORDER — FLUTICASONE PROPIONATE 50 MCG/ACT NA SUSP: 2.0000 | Freq: Every day | NASAL | 6 refills | Status: DC

## 2021-11-02 NOTE — Progress Notes (Signed)
BP 138/72    Pulse 76    Temp 98.2 F (36.8 C) (Oral)    Resp 18    Ht 6\' 3"  (1.905 m)    Wt 169 lb (76.7 kg)    SpO2 95%    BMI 21.12 kg/m    Subjective:    Patient ID: Douglas Abbott., male    DOB: 22-Sep-1966, 56 y.o.   MRN: [id number]  HPI: Douglas Abbott. is a 56 y.o. male, here alone  Chief Complaint  Patient presents with   URI    Coughing up phlegm, headache for 3 days, bodyaches, SOB    URI: Symptoms started three days.  He has been taking benadryl for symptoms.  Symptoms include headache, felt feverish, nasal congestion, body aches.  He says he is feeling a little better today. He said he was a little short of breath yesterday.  Got Flu and Covid swab. Flu came back negative.  Discussed antiviral treatment if positive for covid and he does want it if positive.  Discussed OTC treatments for symptoms.   Asthma:  He is currently taking his Symbicort as directed.  He said he used his albuterol inhaler a couple times yesterday but not today.  Discussed continue current treatment and will send in course of oral steroids.   Relevant past medical, surgical, family and social history reviewed and updated as indicated. Interim medical history since our last visit reviewed. Allergies and medications reviewed and updated.  Review of Systems  Constitutional: Negative for fever or weight change.  HEENT: positive for nasal congestion Respiratory: positive for cough and shortness of breath.   Cardiovascular: Negative for chest pain or palpitations.  Gastrointestinal: Negative for abdominal pain, no bowel changes.  Musculoskeletal: Negative for gait problem or joint swelling.  Skin: Negative for rash.  Neurological: Negative for dizziness, positive for  headache.  No other specific complaints in a complete review of systems (except as listed in HPI above).      Objective:    BP 138/72    Pulse 76    Temp 98.2 F (36.8 C) (Oral)    Resp 18    Ht 6\' 3"  (1.905 m)    Wt 169 lb  (76.7 kg)    SpO2 95%    BMI 21.12 kg/m   Wt Readings from Last 3 Encounters:  11/02/21 169 lb (76.7 kg)  07/23/21 168 lb 14.4 oz (76.6 kg)  02/08/21 174 lb (78.9 kg)    Physical Exam Constitutional: Patient appears well-developed and well-nourished.  No distress.  HEENT: head atraumatic, normocephalic, pupils equal and reactive to light, ears TMs clear, neck supple, throat within normal limits Cardiovascular: Normal rate, regular rhythm and normal heart sounds.  No murmur heard. No BLE edema. Pulmonary/Chest: Effort normal and breath sounds wheezing. No respiratory distress. Abdominal: Soft.  There is no tenderness. Psychiatric: Patient has a normal mood and affect. behavior is normal. Judgment and thought content normal.   Results for orders placed or performed in visit on 11/02/21  POCT Influenza A/B  Result Value Ref Range   Influenza A, POC Negative Negative   Influenza B, POC Negative Negative      Assessment & Plan:    1. Viral upper respiratory tract infection  - Novel Coronavirus, NAA ([facility]) - POCT Influenza A/B - fluticasone (FLONASE) 50 MCG/ACT nasal spray; Place 2 sprays into both nostrils daily.  Dispense: 16 g; Refill: 6 - predniSONE (DELTASONE) 10 MG tablet; Day 1 take 6  pills, day 2 take 5 pills, day 3 take 4 pills, day 4 take 3 pills, day 5 take 2 pills, day 6 take 1 pill  Dispense: 21 tablet; Refill: 0  2. Moderate persistent chronic asthma with acute exacerbation  - predniSONE (DELTASONE) 10 MG tablet; Day 1 take 6 pills, day 2 take 5 pills, day 3 take 4 pills, day 4 take 3 pills, day 5 take 2 pills, day 6 take 1 pill  Dispense: 21 tablet; Refill: 0   Follow up plan: Return if symptoms worsen or fail to improve.

## 2021-11-03 LAB — SARS-COV-2, NAA 2 DAY TAT

## 2021-11-03 LAB — NOVEL CORONAVIRUS, NAA: SARS-CoV-2, NAA: NOT DETECTED

## 2021-11-03 LAB — SPECIMEN STATUS REPORT

## 2021-11-21 ENCOUNTER — Ambulatory Visit (INDEPENDENT_AMBULATORY_CARE_PROVIDER_SITE_OTHER): Payer: 59 | Admitting: Nurse Practitioner

## 2021-11-21 ENCOUNTER — Other Ambulatory Visit: Payer: Self-pay

## 2021-11-21 ENCOUNTER — Encounter: Payer: Self-pay | Admitting: Nurse Practitioner

## 2021-11-21 VITALS — BP 124/82 | HR 96 | Temp 98.0°F | Resp 18 | Ht 75.0 in | Wt 170.7 lb

## 2021-11-21 DIAGNOSIS — J4540 Moderate persistent asthma, uncomplicated: Secondary | ICD-10-CM | POA: Diagnosis not present

## 2021-11-21 NOTE — Progress Notes (Signed)
BP 124/82    Pulse 96    Temp 98 F (36.7 C) (Oral)    Resp 18    Ht 6\' 3"  (1.905 m)    Wt 170 lb 11.2 oz (77.4 kg)    SpO2 97%    BMI 21.34 kg/m    Subjective:    Patient ID: Douglas Glaze., male    DOB: 18-Dec-1965, 56 y.o.   MRN: [id number]  HPI: Douglas Peretz. is a 56 y.o. male, here alone  Chief Complaint  Patient presents with   Consult    For FMLA forms   Asthma/ FMLA paperwork:  He says that he works at [employer]. 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  Flare up causes shortness of breath, headaches and cough.  This restricts his activities.  He says he has FMLA paperwork for when that happens. He is doing well today.  He currently takes Singulair, Flonase Symbicort and albuterol when needed.   Relevant past medical, surgical, family and social history reviewed and updated as indicated. Interim medical history since our last visit reviewed. Allergies and medications reviewed and updated.  Review of Systems  Constitutional: Negative for fever or weight change.  Respiratory: Positive for cough and shortness of breath.   Cardiovascular: Negative for chest pain or palpitations.  Gastrointestinal: Negative for abdominal pain, no bowel changes.  Musculoskeletal: Negative for gait problem or joint swelling.  Skin: Negative for rash.  Neurological: Negative for dizziness, positive for headache.  No other specific complaints in a complete review of systems (except as listed in HPI above).      Objective:    BP 124/82    Pulse 96    Temp 98 F (36.7 C) (Oral)    Resp 18    Ht 6\' 3"  (1.905 m)    Wt 170 lb 11.2 oz (77.4 kg)    SpO2 97%    BMI 21.34 kg/m   Wt Readings from Last 3 Encounters:  11/21/21 170 lb 11.2 oz (77.4 kg)  11/02/21 169 lb (76.7 kg)  07/23/21 168 lb 14.4 oz (76.6 kg)    Physical Exam  Constitutional: Patient appears well-developed and well-nourished.  No distress.  HEENT: head  atraumatic, normocephalic, pupils equal and reactive to light, neck supple Cardiovascular: Normal rate, regular rhythm and normal heart sounds.  No murmur heard. No BLE edema. Pulmonary/Chest: Effort normal and breath sounds normal. No respiratory distress. Abdominal: Soft.  There is no tenderness. Psychiatric: Patient has a normal mood and affect. behavior is normal. Judgment and thought content normal.   Results for orders placed or performed in visit on 11/02/21  Novel Coronavirus, NAA ([facility])   Specimen: Nasopharyngeal(NP) swabs in vial transport medium   Nasopharynge  Previous  Result Value Ref Range   SARS-CoV-2, NAA Not Detected Not Detected  SARS-COV-2, NAA 2 DAY TAT   Nasopharynge  Previous  Result Value Ref Range   SARS-CoV-2, NAA 2 DAY TAT Performed   Specimen status report  Result Value Ref Range   specimen status report Comment   POCT Influenza A/B  Result Value Ref Range   Influenza A, POC Negative Negative   Influenza B, POC Negative Negative      Assessment & Plan:   1. Moderate persistent chronic asthma without complication -doing well right now -continue current treatment plan -FMLA paperwork will be filled out as soon as we receive the forms.  Follow up plan: Return if symptoms worsen or fail to improve.

## 2022-01-02 ENCOUNTER — Other Ambulatory Visit: Payer: Self-pay

## 2022-01-02 ENCOUNTER — Encounter: Payer: Self-pay | Admitting: Nurse Practitioner

## 2022-01-02 ENCOUNTER — Ambulatory Visit (INDEPENDENT_AMBULATORY_CARE_PROVIDER_SITE_OTHER): Payer: 59 | Admitting: Nurse Practitioner

## 2022-01-02 VITALS — BP 130/70 | HR 96 | Temp 97.7°F | Resp 18 | Ht 73.0 in | Wt 170.3 lb

## 2022-01-02 DIAGNOSIS — J339 Nasal polyp, unspecified: Secondary | ICD-10-CM

## 2022-01-02 DIAGNOSIS — J3089 Other allergic rhinitis: Secondary | ICD-10-CM

## 2022-01-02 DIAGNOSIS — R0981 Nasal congestion: Secondary | ICD-10-CM | POA: Diagnosis not present

## 2022-01-02 DIAGNOSIS — K219 Gastro-esophageal reflux disease without esophagitis: Secondary | ICD-10-CM | POA: Diagnosis not present

## 2022-01-02 DIAGNOSIS — J4540 Moderate persistent asthma, uncomplicated: Secondary | ICD-10-CM

## 2022-01-02 MED ORDER — OMEPRAZOLE 20 MG PO CPDR: 20.0000 mg | DELAYED_RELEASE_CAPSULE | Freq: Two times a day (BID) | ORAL | 1 refills | Status: DC

## 2022-01-02 MED ORDER — PREDNISONE 10 MG (21) PO TBPK: ORAL_TABLET | ORAL | 0 refills | Status: DC

## 2022-01-02 MED ORDER — MONTELUKAST SODIUM 10 MG PO TABS: 10.0000 mg | ORAL_TABLET | Freq: Every day | ORAL | 3 refills | Status: DC

## 2022-01-02 NOTE — Progress Notes (Signed)
? ?BP 130/70   Pulse 96   Temp 97.7 ?F (36.5 ?C) (Oral)   Resp 18   Ht '6\' 1"'$  (1.854 m)   Wt 170 lb 4.8 oz (77.2 kg)   SpO2 98%   BMI 22.47 kg/m?   ? ?Subjective:  ? ? Patient ID: Douglas Glaze., male    DOB: [birth year], 56 y.o.   MRN: [id number] ? ?HPI: ?Douglas Montfort. is a 56 y.o. male ? ?Chief Complaint  ?Patient presents with  ? Nasal Congestion  ?  Nose feels like it has polyps are closing up  ? Allergic Rhinitis   ? ?Nasal polyps: He says he noticed something in his nose a few weeks ago,  that is blocking his air flow.  Upon inspection he does have nasal polyps in both nares. Discussed if he has taking his allergy medication and he says he is.  Will send in a steroid taper and refer him to ENT. He says that he was established with [facility] but has not seen them in awhile.  He is going to call and see if they will see him otherwise I did place a new referral.  ? ?AR/nasal congestion/asthma: He says his nose has been stopped up for about a week and a half.  He says he has been taking his medication.  He denies any fever, chills or cough.  He says he is breathing fine right now. He says his asthma does act up when his allergies flare up. He says he has been using his Symbicort as prescribed. He says he needs a refill of his Singulair. Refill sent. ? ?GERD: he says his acid reflux is well controlled with medication.  He says he is out of his omeprazole and needs a refill. Refill sent.   ? ?Relevant past medical, surgical, family and social history reviewed and updated as indicated. Interim medical history since our last visit reviewed. ?Allergies and medications reviewed and updated. ? ?Review of Systems ? ?Constitutional: Negative for fever or weight change.  ?HEENT: positive for nasal congestion and polyps ?Respiratory: Negative for cough and shortness of breath.   ?Cardiovascular: Negative for chest pain or palpitations.  ?Gastrointestinal: Negative for abdominal pain, no bowel changes.   ?Musculoskeletal: Negative for gait problem or joint swelling.  ?Skin: Negative for rash.  ?Neurological: Negative for dizziness or headache.  ?No other specific complaints in a complete review of systems (except as listed in HPI above).  ? ?   ?Objective:  ?  ?BP 130/70   Pulse 96   Temp 97.7 ?F (36.5 ?C) (Oral)   Resp 18   Ht '6\' 1"'$  (1.854 m)   Wt 170 lb 4.8 oz (77.2 kg)   SpO2 98%   BMI 22.47 kg/m?   ?Wt Readings from Last 3 Encounters:  ?01/02/22 170 lb 4.8 oz (77.2 kg)  ?11/21/21 170 lb 11.2 oz (77.4 kg)  ?11/02/21 169 lb (76.7 kg)  ?  ?Physical Exam ? ?Constitutional: Patient appears well-developed and well-nourished.  No distress.  ?HEENT: head atraumatic, normocephalic, pupils equal and reactive to light, ears TMs clear, neck supple, throat within normal limits, nasal polyps noted to both nasal cavities ?Cardiovascular: Normal rate, regular rhythm and normal heart sounds.  No murmur heard. No BLE edema. ?Pulmonary/Chest: Effort normal and breath sounds normal. No respiratory distress. ?Abdominal: Soft.  There is no tenderness. ?Psychiatric: Patient has a normal mood and affect. behavior is normal. Judgment and thought content normal.  ? ?Results for  orders placed or performed in visit on 11/02/21  ?Novel Coronavirus, NAA ([facility])  ? Specimen: Nasopharyngeal(NP) swabs in vial transport medium  ? Nasopharynge  Previous  ?Result Value Ref Range  ? SARS-CoV-2, NAA Not Detected Not Detected  ?SARS-COV-2, NAA 2 DAY TAT  ? Nasopharynge  Previous  ?Result Value Ref Range  ? SARS-CoV-2, NAA 2 DAY TAT Performed   ?Specimen status report  ?Result Value Ref Range  ? specimen status report Comment   ?POCT Influenza A/B  ?Result Value Ref Range  ? Influenza A, POC Negative Negative  ? Influenza B, POC Negative Negative  ? ?   ?Assessment & Plan:  ? ?1. Nasal polyps ?-continue taking allergy medication ?- predniSONE (STERAPRED UNI-PAK 21 TAB) 10 MG (21) TBPK tablet; Take as directed on package.  (60 mg po on day 1,  50 mg po on day 2...)  Dispense: 21 tablet; Refill: 0 ?- Ambulatory referral to ENT ? ?2. Seasonal allergic rhinitis due to other allergic trigger ?-continue taking allergy medication ?- predniSONE (STERAPRED UNI-PAK 21 TAB) 10 MG (21) TBPK tablet; Take as directed on package.  (60 mg po on day 1, 50 mg po on day 2...)  Dispense: 21 tablet; Refill: 0 ? ?3. Nasal congestion ?-continue taking allergy medication ?- predniSONE (STERAPRED UNI-PAK 21 TAB) 10 MG (21) TBPK tablet; Take as directed on package.  (60 mg po on day 1, 50 mg po on day 2...)  Dispense: 21 tablet; Refill: 0 ? ?4. Gastroesophageal reflux disease ? ?- omeprazole (PRILOSEC) 20 MG capsule; Take 1 capsule (20 mg total) by mouth 2 (two) times daily.  Dispense: 180 capsule; Refill: 1 ? ?5. Moderate persistent chronic asthma without complication ? ?- montelukast (SINGULAIR) 10 MG tablet; Take 1 tablet (10 mg total) by mouth at bedtime.  Dispense: 90 tablet; Refill: 3  ? ?Follow up plan: ?Return if symptoms worsen or fail to improve. ? ? ? ? ? ?

## 2022-01-19 ENCOUNTER — Other Ambulatory Visit: Payer: Self-pay | Admitting: Nurse Practitioner

## 2022-01-21 ENCOUNTER — Other Ambulatory Visit: Payer: Self-pay

## 2022-01-21 ENCOUNTER — Ambulatory Visit: Payer: 59 | Admitting: Nurse Practitioner

## 2022-01-21 ENCOUNTER — Encounter: Payer: Self-pay | Admitting: Nurse Practitioner

## 2022-01-21 VITALS — BP 118/72 | HR 78 | Temp 97.9°F | Resp 16 | Ht 73.0 in | Wt 173.2 lb

## 2022-01-21 DIAGNOSIS — J3089 Other allergic rhinitis: Secondary | ICD-10-CM | POA: Diagnosis not present

## 2022-01-21 DIAGNOSIS — J339 Nasal polyp, unspecified: Secondary | ICD-10-CM | POA: Diagnosis not present

## 2022-01-21 DIAGNOSIS — R3916 Straining to void: Secondary | ICD-10-CM

## 2022-01-21 DIAGNOSIS — J4540 Moderate persistent asthma, uncomplicated: Secondary | ICD-10-CM | POA: Diagnosis not present

## 2022-01-21 DIAGNOSIS — Z79899 Other long term (current) drug therapy: Secondary | ICD-10-CM

## 2022-01-21 DIAGNOSIS — K219 Gastro-esophageal reflux disease without esophagitis: Secondary | ICD-10-CM | POA: Diagnosis not present

## 2022-01-21 DIAGNOSIS — E785 Hyperlipidemia, unspecified: Secondary | ICD-10-CM

## 2022-01-21 DIAGNOSIS — K625 Hemorrhage of anus and rectum: Secondary | ICD-10-CM

## 2022-01-21 LAB — COMPLETE METABOLIC PANEL WITH GFR
AG Ratio: 1.7 (calc) (ref 1.0–2.5)
ALT: 16 U/L (ref 9–46)
AST: 20 U/L (ref 10–35)
Albumin: 4.1 g/dL (ref 3.6–5.1)
Alkaline phosphatase (APISO): 103 U/L (ref 35–144)
BUN: 17 mg/dL (ref 7–25)
CO2: 29 mmol/L (ref 20–32)
Calcium: 9.3 mg/dL (ref 8.6–10.3)
Chloride: 106 mmol/L (ref 98–110)
Creat: 1.04 mg/dL (ref 0.70–1.30)
Globulin: 2.4 g/dL (calc) (ref 1.9–3.7)
Glucose, Bld: 102 mg/dL — ABNORMAL HIGH (ref 65–99)
Potassium: 4.6 mmol/L (ref 3.5–5.3)
Sodium: 140 mmol/L (ref 135–146)
Total Bilirubin: 0.7 mg/dL (ref 0.2–1.2)
Total Protein: 6.5 g/dL (ref 6.1–8.1)
eGFR: 85 mL/min/{1.73_m2} (ref >=60)

## 2022-01-21 LAB — CBC WITH DIFFERENTIAL/PLATELET
Absolute Monocytes: 467 cells/uL (ref 200–950)
Basophils Absolute: 29 cells/uL (ref 0–200)
Basophils Relative: 0.9 %
Eosinophils Absolute: 141 cells/uL (ref 15–500)
Eosinophils Relative: 4.4 %
HCT: 44.1 % (ref 38.5–50.0)
Hemoglobin: 14.8 g/dL (ref 13.2–17.1)
Lymphs Abs: 1075 cells/uL (ref 850–3900)
MCH: 30.8 pg (ref 27.0–33.0)
MCHC: 33.6 g/dL (ref 32.0–36.0)
MCV: 91.9 fL (ref 80.0–100.0)
MPV: 10.2 fL (ref 7.5–12.5)
Monocytes Relative: 14.6 %
Neutro Abs: 1488 cells/uL — ABNORMAL LOW (ref 1500–7800)
Neutrophils Relative %: 46.5 %
Platelets: 272 10*3/uL (ref 140–400)
RBC: 4.8 10*6/uL (ref 4.20–5.80)
RDW: 13.7 % (ref 11.0–15.0)
Total Lymphocyte: 33.6 %
WBC: 3.2 10*3/uL — ABNORMAL LOW (ref 3.8–10.8)

## 2022-01-21 LAB — LIPID PANEL
Cholesterol: 221 mg/dL — ABNORMAL HIGH (ref <200)
HDL: 74 mg/dL (ref >=40)
LDL Cholesterol (Calc): 132 mg/dL (calc) — ABNORMAL HIGH
Non-HDL Cholesterol (Calc): 147 mg/dL (calc) — ABNORMAL HIGH (ref <130)
Total CHOL/HDL Ratio: 3 (calc) (ref <5.0)
Triglycerides: 55 mg/dL (ref <150)

## 2022-01-21 MED ORDER — BUDESONIDE-FORMOTEROL FUMARATE 160-4.5 MCG/ACT IN AERO: 2.0000 | INHALATION_SPRAY | Freq: Two times a day (BID) | RESPIRATORY_TRACT | 3 refills | Status: DC

## 2022-01-21 MED ORDER — ALBUTEROL SULFATE HFA 108 (90 BASE) MCG/ACT IN AERS: 1.0000 | INHALATION_SPRAY | Freq: Four times a day (QID) | RESPIRATORY_TRACT | 3 refills | Status: DC | PRN

## 2022-01-21 MED ORDER — TAMSULOSIN HCL 0.4 MG PO CAPS: ORAL_CAPSULE | ORAL | 3 refills | Status: DC

## 2022-01-21 NOTE — Progress Notes (Signed)
? ?BP 118/72   Pulse 78   Temp 97.9 ?F (36.6 ?C) (Oral)   Resp 16   Ht '6\' 1"'$  (1.854 m)   Wt 173 lb 3.2 oz (78.6 kg)   SpO2 98%   BMI 22.85 kg/m?   ? ?Subjective:  ? ? Patient ID: Douglas Glaze., male    DOB: 01-22-1966, 56 y.o.   MRN: [id number] ? ?HPI: ?Douglas Ebel. is a 56 y.o. male ? ?Chief Complaint  ?Patient presents with  ? Asthma  ? Medication Refill  ?  Tamsulosin  ? Gastroesophageal Reflux  ? ?Asthma/Allergic rhinitis/nasal polyps: He is going to see ENT the beginning of May for nasal polyps. He says his asthma has been doing good. He says that most days he does not use his albuterol but when he does it is mostly once a day.  He is using Symbicort twice a day. He tries to avoid triggers.  He has been taking his allergy medication. ? ?Hyperlipidemia: Last LDL 114.  Will get labs today. He is not currently on medication. ?The 10-year ASCVD risk score (Arnett DK, et al., 2019) is: 5% ?  Values used to calculate the score: ?    Age: 78 years ?    Sex: Male ?    Is Non-Hispanic African American: Yes ?    Diabetic: No ?    Tobacco smoker: No ?    Systolic Blood Pressure: 409 mmHg ?    Is BP treated: No ?    HDL Cholesterol: 69 mg/dL ?    Total Cholesterol: 198 mg/dL  ? ?GERD: He says the omeprazole has really helped his acid reflux. He says he is doing well with his acid reflux and has not had any breakthrough symptoms.  ? ?Urinary staining:  He is currently taking tamsulosin 0.4 mg.  His last PSA was normal.  Will send in refill.  ? ?Rectal bleeding:  He says that he has had a couple episodes of bloody stool. He says he is not sure if it is bright red or dark red.  He says that sometimes he does notice a clot.  Will refer back to GI for further work up. He is due for a colonoscopy next year.  ? ?Relevant past medical, surgical, family and social history reviewed and updated as indicated. Interim medical history since our last visit reviewed. ?Allergies and medications reviewed and  updated. ? ?Review of Systems ? ?Constitutional: Negative for fever or weight change.  ?Respiratory: Negative for cough and shortness of breath.   ?Cardiovascular: Negative for chest pain or palpitations.  ?Gastrointestinal: Negative for abdominal pain, bloody stool ?Musculoskeletal: Negative for gait problem or joint swelling.  ?Skin: Negative for rash.  ?Neurological: Negative for dizziness or headache.  ?No other specific complaints in a complete review of systems (except as listed in HPI above).  ? ?   ?Objective:  ?  ?BP 118/72   Pulse 78   Temp 97.9 ?F (36.6 ?C) (Oral)   Resp 16   Ht '6\' 1"'$  (1.854 m)   Wt 173 lb 3.2 oz (78.6 kg)   SpO2 98%   BMI 22.85 kg/m?   ?Wt Readings from Last 3 Encounters:  ?01/21/22 173 lb 3.2 oz (78.6 kg)  ?01/02/22 170 lb 4.8 oz (77.2 kg)  ?11/21/21 170 lb 11.2 oz (77.4 kg)  ?  ?Physical Exam ? ?Constitutional: Patient appears well-developed and well-nourished. No distress.  ?HEENT: head atraumatic, normocephalic, pupils equal and reactive to light,  neck supple ?Cardiovascular: Normal rate, regular rhythm and normal heart sounds.  No murmur heard. No BLE edema. ?Pulmonary/Chest: Effort normal and breath sounds normal. No respiratory distress. ?Abdominal: Soft.  There is no tenderness. ?Psychiatric: Patient has a normal mood and affect. behavior is normal. Judgment and thought content normal.  ? ?Results for orders placed or performed in visit on 11/02/21  ?Novel Coronavirus, NAA ([facility])  ? Specimen: Nasopharyngeal(NP) swabs in vial transport medium  ? Nasopharynge  Previous  ?Result Value Ref Range  ? SARS-CoV-2, NAA Not Detected Not Detected  ?SARS-COV-2, NAA 2 DAY TAT  ? Nasopharynge  Previous  ?Result Value Ref Range  ? SARS-CoV-2, NAA 2 DAY TAT Performed   ?Specimen status report  ?Result Value Ref Range  ? specimen status report Comment   ?POCT Influenza A/B  ?Result Value Ref Range  ? Influenza A, POC Negative Negative  ? Influenza B, POC Negative Negative  ? ?    ?Assessment & Plan:  ? ?1. Moderate persistent chronic asthma without complication ? ?- albuterol (VENTOLIN HFA) 108 (90 Base) MCG/ACT inhaler; Inhale 1-2 puffs into the lungs every 6 (six) hours as needed for wheezing or shortness of breath.  Dispense: 6.7 g; Refill: 3 ?- budesonide-formoterol (SYMBICORT) 160-4.5 MCG/ACT inhaler; Inhale 2 puffs into the lungs 2 (two) times daily.  Dispense: 30.6 g; Refill: 3 ?- CBC with Differential/Platelet ?- COMPLETE METABOLIC PANEL WITH GFR ? ?2. Seasonal allergic rhinitis due to other allergic trigger ?-continue with current medications ? ?3. Nasal polyps ?-keep appointment with ENT ? ?4. Gastroesophageal reflux disease without esophagitis ? ?- CBC with Differential/Platelet ?- COMPLETE METABOLIC PANEL WITH GFR ? ?5. Hyperlipidemia, unspecified hyperlipidemia type ? ?- Lipid panel ?- COMPLETE METABOLIC PANEL WITH GFR ? ?6. Rectal bleeding ? ?- CBC with Differential/Platelet ?- COMPLETE METABOLIC PANEL WITH GFR ?- Ambulatory referral to Gastroenterology ? ?7. Urinary straining ? ?- tamsulosin (FLOMAX) 0.4 MG CAPS capsule; TAKE 1 CAPSULE(0.4 MG) BY MOUTH DAILY  Dispense: 90 capsule; Refill: 3 ? ?8. Medication management ? ?- CBC with Differential/Platelet ?- COMPLETE METABOLIC PANEL WITH GFR  ? ?Follow up plan: ?Return in about 6 months (around 07/23/2022) for follow up. ? ? ? ? ? ?

## 2022-01-21 NOTE — Telephone Encounter (Signed)
Refilled 01/21/2022 #90 [id number] refills. ?Requested Prescriptions  ?Pending Prescriptions Disp Refills  ?? tamsulosin (FLOMAX) 0.4 MG CAPS capsule [Pharmacy Med Name: TAMSULOSIN 0.'4MG'$  CAPSULES] 90 capsule 0  ?  Sig: TAKE 1 CAPSULE(0.4 MG) BY MOUTH DAILY  ?  ? Urology: Alpha-Adrenergic Blocker Passed - 01/19/2022  4:49 PM  ?  ?  Passed - PSA in normal range and within 360 days  ?  PSA  ?Date Value Ref Range Status  ?07/23/2021 1.32 < OR = 4.00 ng/mL Final  ?  Comment:  ?  The total PSA value from this assay system is  ?standardized against the WHO standard. The test  ?result will be approximately 20% lower when compared  ?to the equimolar-standardized total PSA (Beckman  ?Coulter). Comparison of serial PSA results should be  ?interpreted with this fact in mind. ?. ?This test was performed using the Siemens  ?chemiluminescent method. Values obtained from  ?different assay methods cannot be used ?interchangeably. PSA levels, regardless of ?value, should not be interpreted as absolute ?evidence of the presence or absence of disease. ?  ? ?Prostate Specific Ag, Serum  ?Date Value Ref Range Status  ?04/05/2015 1.3 0.0 - 4.0 ng/mL Final  ?  Comment:  ?  Roche ECLIA methodology. ?According to the American Urological Association, Serum PSA should ?decrease and remain at undetectable levels after radical ?prostatectomy. The AUA defines biochemical recurrence as an initial ?PSA value 0.2 ng/mL or greater followed by a subsequent confirmatory ?PSA value 0.2 ng/mL or greater. ?Values obtained with different assay methods or kits cannot be used ?interchangeably. Results cannot be interpreted as absolute evidence ?of the presence or absence of malignant disease. ?  ?   ?  ?  Passed - Last BP in normal range  ?  BP Readings from Last 1 Encounters:  ?01/21/22 118/72  ?   ?  ?  Passed - Valid encounter within last 12 months  ?  Recent Outpatient Visits   ?      ? Today Moderate persistent chronic asthma without complication  ? [place], FNP  ? 2 weeks ago Nasal polyps  ? [facility] Serafina Royals F, FNP  ? 2 months ago Moderate persistent chronic asthma without complication  ? [facility] Serafina Royals F, FNP  ? 2 months ago Viral upper respiratory tract infection  ? [place], FNP  ? 6 months ago Need for influenza vaccination  ? [place], DO  ?  ?  ? ?  ?  ?  ? ?

## 2022-04-03 ENCOUNTER — Ambulatory Visit (INDEPENDENT_AMBULATORY_CARE_PROVIDER_SITE_OTHER): Payer: 59 | Admitting: Gastroenterology

## 2022-04-03 ENCOUNTER — Encounter: Payer: Self-pay | Admitting: Gastroenterology

## 2022-04-03 ENCOUNTER — Other Ambulatory Visit: Payer: Self-pay

## 2022-04-03 VITALS — BP 128/74 | HR 54 | Temp 98.1°F | Ht 73.0 in | Wt 174.5 lb

## 2022-04-03 DIAGNOSIS — K9049 Malabsorption due to intolerance, not elsewhere classified: Secondary | ICD-10-CM | POA: Diagnosis not present

## 2022-04-03 NOTE — Progress Notes (Signed)
Cephas Darby, MD [address]  [place]  [place], [place] [address]  Main: [phone]  Fax: [phone]    Gastroenterology Consultation  Referring Provider:     Bo Merino, FNP Primary Care Physician:  Bo Merino, FNP Primary Gastroenterologist:  Dr. Cephas Darby Reason for Consultation: Rectal bleeding, food intolerances/allergies        HPI:   Douglas Therien. is a 56 y.o. male referred by Dr. Reece Packer, Myna Hidalgo, FNP  for consultation & management of rectal bleeding.  Patient reports intermittent episodes of painless bright red blood per rectum.  He was seen by PCP in April 2023, referred to GI for further evaluation.  His hemoglobin was normal at that time.  Patient reports that he notices scant amount of bright red blood per rectum whenever he drinks alcohol.  He reports that his bowel movements have been regular and formed.  He denies any constipation or diarrhea.  He denies any other hemorrhoidal symptoms.  Today, patient is also concerned about change of voice, hoarseness, scratchy feeling in his throat, has several food intolerances.  He is currently seeing allergy and immunology, receiving allergy shots.  Patient has been working in an [place] [employer] right before the pandemic and his symptoms have started since then.  He does not wear a mask while he is working.  Patient denies any trouble swallowing.  He is currently taking omeprazole 20 mg twice daily since March 2023.  He is not able to sing because of his change of voice.  NSAIDs: None  Antiplts/Anticoagulants/Anti thrombotics: None  GI Procedures:  Colonoscopy 05/04/2018 by Dr. Vicente Males - Three 3 to 4 mm polyps in the sigmoid colon, in the ascending colon and in the cecum, removed with a cold snare. Resected and retrieved. - Non-bleeding internal hemorrhoids. - The examination was otherwise normal on direct and retroflexion views. DIAGNOSIS:  A.  COLON POLYP, CECUM; COLD SNARE:  - COLONIC  MUCOSA WITH EDEMA AND FOCAL CRYPTITIS.  - NEGATIVE FOR DYSPLASIA AND MALIGNANCY.   Note: Sections show focal active colitis in the form of a single  neutrophil within a crypt.  Architectural features of chronicity are  absent.  The findings are suggestive of bowel preparation artifact.  Clinical correlation is necessary.   B.  COLON POLYP, ASCENDING; COLD SNARE:  - TUBULAR ADENOMA.  - NEGATIVE FOR HIGH-GRADE DYSPLASIA AND MALIGNANCY.   C.  COLON POLYP, SIGMOID; COLD SNARE:  - HYPERPLASTIC EPITHELIAL CHANGE.  - NEGATIVE FOR DYSPLASIA AND MALIGNANCY.     Past Medical History:  Diagnosis Date   Allergic rhinitis    Allergy    Asthma    BRBPR (bright red blood per rectum) 03/27/2018   Decreased libido    History of pleurisy    Sinus bradycardia    a. 03/2017 Echo: Ef 50-55%.    Past Surgical History:  Procedure Laterality Date   COLONOSCOPY WITH PROPOFOL N/A 05/04/2018   Procedure: COLONOSCOPY WITH PROPOFOL;  Surgeon: Jonathon Bellows, MD;  Location: [facility] ENDOSCOPY;  Service: Gastroenterology;  Laterality: N/A;   NO PAST SURGERIES       Current Outpatient Medications:    albuterol (VENTOLIN HFA) 108 (90 Base) MCG/ACT inhaler, Inhale 1-2 puffs into the lungs every 6 (six) hours as needed for wheezing or shortness of breath., Disp: 6.7 g, Rfl: 3   budesonide-formoterol (SYMBICORT) 160-4.5 MCG/ACT inhaler, Inhale 2 puffs into the lungs 2 (two) times daily., Disp: 30.6 g, Rfl: 3  fluticasone (FLONASE) 50 MCG/ACT nasal spray, Place 2 sprays into both nostrils daily., Disp: 16 g, Rfl: 6   meloxicam (MOBIC) 15 MG tablet, Take 15 mg by mouth daily., Disp: , Rfl:    montelukast (SINGULAIR) 10 MG tablet, Take 1 tablet (10 mg total) by mouth at bedtime., Disp: 90 tablet, Rfl: 3   NASACORT ALLERGY 24HR 55 MCG/ACT AERO nasal inhaler, 2 sprays daily., Disp: , Rfl:    omeprazole (PRILOSEC) 20 MG capsule, Take 1 capsule (20 mg total) by mouth 2 (two) times daily., Disp: 180 capsule, Rfl: 1    tamsulosin (FLOMAX) 0.4 MG CAPS capsule, TAKE 1 CAPSULE(0.4 MG) BY MOUTH DAILY, Disp: 90 capsule, Rfl: 3   Family History  Problem Relation Age of Onset   Asthma Mother    Lung cancer Mother    Kidney disease Mother    Heart disease Neg Hx      Social History   Tobacco Use   Smoking status: Former    Packs/day: 1.00    Years: 8.00    Total pack years: 8.00    Types: Cigarettes    Quit date: 2000    Years since quitting: 23.4   Smokeless tobacco: Never  Vaping Use   Vaping Use: Never used  Substance Use Topics   Alcohol use: Yes    Alcohol/week: 0.0 standard drinks of alcohol    Comment: occ   Drug use: Yes    Frequency: 1.0 times per week    Types: Marijuana    Comment: occasional marijuana use    Allergies as of 04/03/2022 - Review Complete 04/03/2022  Allergen Reaction Noted   Shellfish allergy Swelling 03/04/2017   Peanut-containing drug products  09/24/2018    Review of Systems:    All systems reviewed and negative except where noted in HPI.   Physical Exam:  BP 128/74 (BP Location: Left Arm, Patient Position: Sitting, Cuff Size: Normal)   Pulse (!) 54   Temp 98.1 F (36.7 C) (Oral)   Ht '6\' 1"'$  (1.854 m)   Wt 174 lb 8 oz (79.2 kg)   BMI 23.02 kg/m  No LMP for male patient.  General:   Alert,  Well-developed, well-nourished, pleasant and cooperative in NAD Head:  Normocephalic and atraumatic. Eyes:  Sclera clear, no icterus.   Conjunctiva pink. Ears:  Normal auditory acuity. Nose:  No deformity, discharge, or lesions. Mouth:  No deformity or lesions,oropharynx pink & moist. Neck:  Supple; no masses or thyromegaly. Lungs:  Respirations even and unlabored.  Clear throughout to auscultation.   No wheezes, crackles, or rhonchi. No acute distress. Heart:  Regular rate and rhythm; no murmurs, clicks, rubs, or gallops. Abdomen:  Normal bowel sounds. Soft, non-tender and non-distended without masses, hepatosplenomegaly or hernias noted.  No guarding or rebound  tenderness.   Rectal: Not performed Msk:  Symmetrical without gross deformities. Good, equal movement & strength bilaterally. Pulses:  Normal pulses noted. Extremities:  No clubbing or edema.  No cyanosis. Neurologic:  Alert and oriented x3;  grossly normal neurologically. Skin:  Intact without significant lesions or rashes. No jaundice. Psych:  Alert and cooperative. Normal mood and affect.  Imaging Studies: No abdominal imaging  Assessment and Plan:   Douglas Abbott. is a 56 y.o. pleasant African-American male with history of various allergies, is seen in consultation for painless intermittent bright red blood per rectum which is self-limited.  No evidence of anemia.  Rectal bleeding is secondary to hemorrhoids precipitated whenever he consumes alcohol.  Patient underwent hemorrhoid banding x 3 in the past  Painless rectal bleeding secondary to symptomatic hemorrhoids Patient would like to defer hemorrhoid banding at this time Patient is not due for a surveillance colonoscopy until 04/2023  Change of voice, several food intolerances Recommend EGD with esophageal biopsies Check food allergy profile and alpha gal panel Continue follow-up with allergy and immunology   Follow up based on the above work-up   Cephas Darby, MD

## 2022-04-06 LAB — FOOD ALLERGY PROFILE
Allergen Corn, IgE: 0.53 kU/L — AB
Clam IgE: 1.67 kU/L — AB
Codfish IgE: <0.1 kU/L
Egg White IgE: <0.1 kU/L
Milk IgE: 0.12 kU/L — AB
Peanut IgE: 2.08 kU/L — AB
Scallop IgE: 0.65 kU/L — AB
Sesame Seed IgE: 1.46 kU/L — AB
Shrimp IgE: 8.9 kU/L — AB
Soybean IgE: 0.31 kU/L — AB
Walnut IgE: 0.64 kU/L — AB
Wheat IgE: 1.22 kU/L — AB

## 2022-04-06 LAB — ALPHA-GAL PANEL
Allergen Lamb IgE: 0.22 kU/L — AB
Beef IgE: 0.17 kU/L — AB
IgE (Immunoglobulin E), Serum: 886 IU/mL — ABNORMAL HIGH (ref 6–495)
O215-IgE Alpha-Gal: <0.1 kU/L
Pork IgE: <0.1 kU/L

## 2022-04-09 ENCOUNTER — Encounter: Payer: Self-pay | Admitting: Gastroenterology

## 2022-04-10 ENCOUNTER — Ambulatory Visit
Admission: RE | Admit: 2022-04-10 | Discharge: 2022-04-10 | Disposition: A | Discharge status: Home / Self Care | Payer: 59 | Attending: Gastroenterology | Admitting: Gastroenterology

## 2022-04-10 ENCOUNTER — Encounter
Admission: RE | Disposition: A | Discharge status: Home / Self Care | Payer: Self-pay | Source: Home / Self Care | Attending: Gastroenterology

## 2022-04-10 ENCOUNTER — Ambulatory Visit: Payer: 59 | Admitting: Certified Registered"

## 2022-04-10 ENCOUNTER — Encounter: Payer: Self-pay | Admitting: Gastroenterology

## 2022-04-10 DIAGNOSIS — K449 Diaphragmatic hernia without obstruction or gangrene: Secondary | ICD-10-CM | POA: Diagnosis not present

## 2022-04-10 DIAGNOSIS — K9049 Malabsorption due to intolerance, not elsewhere classified: Secondary | ICD-10-CM

## 2022-04-10 DIAGNOSIS — K219 Gastro-esophageal reflux disease without esophagitis: Secondary | ICD-10-CM | POA: Diagnosis not present

## 2022-04-10 DIAGNOSIS — J029 Acute pharyngitis, unspecified: Secondary | ICD-10-CM | POA: Insufficient documentation

## 2022-04-10 DIAGNOSIS — Z87891 Personal history of nicotine dependence: Secondary | ICD-10-CM | POA: Insufficient documentation

## 2022-04-10 HISTORY — PX: ESOPHAGOGASTRODUODENOSCOPY (EGD) WITH PROPOFOL: SHX5813

## 2022-04-10 HISTORY — DX: Cardiac arrhythmia, unspecified: I49.9

## 2022-04-10 SURGERY — ESOPHAGOGASTRODUODENOSCOPY (EGD) WITH PROPOFOL
Anesthesia: General

## 2022-04-10 MED ORDER — GLYCOPYRROLATE 0.2 MG/ML IJ SOLN
INTRAMUSCULAR | Status: DC | PRN
  Administered 2022-04-10: .2 mg via INTRAVENOUS

## 2022-04-10 MED ORDER — PROPOFOL 10 MG/ML IV BOLUS
INTRAVENOUS | Status: DC | PRN
  Administered 2022-04-10: 50 mg via INTRAVENOUS
  Administered 2022-04-10: 130 mg via INTRAVENOUS
  Administered 2022-04-10 (×2): 50 mg via INTRAVENOUS

## 2022-04-10 MED ORDER — LIDOCAINE HCL (CARDIAC) PF 100 MG/5ML IV SOSY
PREFILLED_SYRINGE | INTRAVENOUS | Status: DC | PRN
  Administered 2022-04-10: 100 mg via INTRAVENOUS

## 2022-04-10 MED ORDER — SODIUM CHLORIDE 0.9 % IV SOLN
INTRAVENOUS | Status: DC
  Administered 2022-04-10: 1000 mL via INTRAVENOUS

## 2022-04-10 NOTE — Anesthesia Preprocedure Evaluation (Signed)
Anesthesia Evaluation  Patient identified by MRN, date of birth, ID band Patient awake    Reviewed: Allergy & Precautions, NPO status , Patient's Chart, lab work & pertinent test results  Airway Mallampati: II  TM Distance: >3 FB     Dental  (+) Dental Advidsory Given, Chipped   Pulmonary neg shortness of breath, asthma , neg sleep apnea, neg recent URI, former smoker,    Pulmonary exam normal        Cardiovascular negative cardio ROS Normal cardiovascular exam     Neuro/Psych negative neurological ROS  negative psych ROS   GI/Hepatic Neg liver ROS, GERD  ,  Endo/Other  negative endocrine ROS  Renal/GU negative Renal ROS  negative genitourinary   Musculoskeletal negative musculoskeletal ROS (+)   Abdominal Normal abdominal exam  (+)   Peds negative pediatric ROS (+)  Hematology negative hematology ROS (+)   Anesthesia Other Findings Past Medical History: No date: Allergic rhinitis No date: Allergy No date: Asthma 03/27/2018: BRBPR (bright red blood per rectum) No date: Decreased libido No date: Dysrhythmia No date: History of pleurisy No date: Sinus bradycardia     Comment:  a. 03/2017 Echo: Ef 50-55%.   Reproductive/Obstetrics                             Anesthesia Physical  Anesthesia Plan  ASA: 2  Anesthesia Plan: General   Post-op Pain Management:    Induction: Intravenous  PONV Risk Score and Plan: 2 and Propofol infusion and TIVA  Airway Management Planned: Nasal Cannula  Additional Equipment:   Intra-op Plan:   Post-operative Plan:   Informed Consent: I have reviewed the patients History and Physical, chart, labs and discussed the procedure including the risks, benefits and alternatives for the proposed anesthesia with the patient or authorized representative who has indicated his/her understanding and acceptance.     Dental advisory given  Plan Discussed  with: CRNA and Surgeon  Anesthesia Plan Comments:         Anesthesia Quick Evaluation

## 2022-04-10 NOTE — H&P (Signed)
Cephas Darby, MD [address]  [place]  [place], [place] [address]  Main: [phone]  Fax: [phone] Pager: [phone]  Primary Care Physician:  Bo Merino, FNP Primary Gastroenterologist:  Dr. Cephas Darby  Pre-Procedure History & Physical: HPI:  Douglas Abbott. is a 56 y.o. male is here for an endoscopy.   Past Medical History:  Diagnosis Date   Allergic rhinitis    Allergy    Asthma    BRBPR (bright red blood per rectum) 03/27/2018   Decreased libido    Dysrhythmia    History of pleurisy    Sinus bradycardia    a. 03/2017 Echo: Ef 50-55%.    Past Surgical History:  Procedure Laterality Date   COLONOSCOPY WITH PROPOFOL N/A 05/04/2018   Procedure: COLONOSCOPY WITH PROPOFOL;  Surgeon: Jonathon Bellows, MD;  Location[facility] ENDOSCOPY;  Service: Gastroenterology;  Laterality: N/A;   NO PAST SURGERIES      Prior to Admission medications   Medication Sig Start Date End Date Taking? Authorizing Provider  budesonide-formoterol (SYMBICORT) 160-4.5 MCG/ACT inhaler Inhale 2 puffs into the lungs 2 (two) times daily. 01/21/22  Yes Bo Merino, FNP  meloxicam (MOBIC) 15 MG tablet Take 15 mg by mouth daily. 06/01/21  Yes [provider]  montelukast (SINGULAIR) 10 MG tablet Take 1 tablet (10 mg total) by mouth at bedtime. 01/02/22  Yes Bo Merino, FNP  NASACORT ALLERGY 24HR 55 MCG/ACT AERO nasal inhaler 2 sprays daily. 03/28/22  Yes [provider]  omeprazole (PRILOSEC) 20 MG capsule Take 1 capsule (20 mg total) by mouth 2 (two) times daily. 01/02/22  Yes Bo Merino, FNP  tamsulosin (FLOMAX) 0.4 MG CAPS capsule TAKE 1 CAPSULE(0.4 MG) BY MOUTH DAILY 01/21/22  Yes Bo Merino, FNP  albuterol (VENTOLIN HFA) 108 (90 Base) MCG/ACT inhaler Inhale 1-2 puffs into the lungs every 6 (six) hours as needed for wheezing or shortness of breath. 01/21/22   Bo Merino, FNP  fluticasone (FLONASE) 50 MCG/ACT nasal spray Place 2 sprays into both  nostrils daily. Patient not taking: Reported on 04/10/2022 11/02/21   Bo Merino, FNP    Allergies as of 04/03/2022 - Review Complete 04/03/2022  Allergen Reaction Noted   Shellfish allergy Swelling 03/04/2017   Peanut-containing drug products  09/24/2018    Family History  Problem Relation Age of Onset   Asthma Mother    Lung cancer Mother    Kidney disease Mother    Heart disease Neg Hx     Social History   Socioeconomic History   Marital status: Married    Spouse name: Kenney Houseman   Number of children: 3   Years of education: 12   Highest education level: High school graduate  Occupational History   Occupation: Scientist, forensic: [employer]  Tobacco Use   Smoking status: Former    Packs/day: 1.00    Years: 8.00    Total pack years: 8.00    Types: Cigarettes    Quit date: 2000    Years since quitting: 23.5   Smokeless tobacco: Never  Vaping Use   Vaping Use: Never used  Substance and Sexual Activity   Alcohol use: Yes    Alcohol/week: 0.0 standard drinks of alcohol    Comment: occ   Drug use: Yes    Frequency: 1.0 times per week    Types: Marijuana    Comment: used yesterday   Sexual activity: Yes    Partners: Female  Other Topics Concern   Not on file  Social History Narrative   Not on file   Social Determinants of Health   Financial Resource Strain: Low Risk  (11/25/2018)   Overall Financial Resource Strain (CARDIA)    Difficulty of Paying Living Expenses: Not hard at all  Food Insecurity: No Food Insecurity (11/25/2018)   Hunger Vital Sign    Worried About Running Out of Food in the Last Year: Never true    Ran Out of Food in the Last Year: Never true  Transportation Needs: Unknown (11/25/2018)   PRAPARE - Hydrologist (Medical): No    Lack of Transportation (Non-Medical): Not on file  Physical Activity: Inactive (11/25/2018)   Exercise Vital Sign    Days of Exercise per Week: 0 days    Minutes of Exercise per Session: 0  min  Stress: No Stress Concern Present (11/25/2018)   [place]    Feeling of Stress : Not at all  Social Connections: Moderately Integrated (11/25/2018)   Social Connection and Isolation Panel [NHANES]    Frequency of Communication with Friends and Family: Once a week    Frequency of Social Gatherings with Friends and Family: More than three times a week    Attends Religious Services: More than 4 times per year    Active Member of [employer] or Organizations: No    Attends Archivist Meetings: Never    Marital Status: Married  Human resources officer Violence: Not At Risk (11/25/2018)   Humiliation, Afraid, Rape, and Kick questionnaire    Fear of Current or Ex-Partner: No    Emotionally Abused: No    Physically Abused: No    Sexually Abused: No    Review of Systems: See HPI, otherwise negative ROS  Physical Exam: BP 103/81   Pulse 79   Temp (!) 97.1 F (36.2 C) (Temporal)   Resp 18   Ht '6\' 2"'$  (1.88 m)   Wt 77.3 kg   SpO2 99%   BMI 21.87 kg/m  General:   Alert,  pleasant and cooperative in NAD Head:  Normocephalic and atraumatic. Neck:  Supple; no masses or thyromegaly. Lungs:  Clear throughout to auscultation.    Heart:  Regular rate and rhythm. Abdomen:  Soft, nontender and nondistended. Normal bowel sounds, without guarding, and without rebound.   Neurologic:  Alert and  oriented x4;  grossly normal neurologically.  Impression/Plan: Douglas Glaze. is here for an endoscopy to be performed for several food intolerances  Risks, benefits, limitations, and alternatives regarding  endoscopy have been reviewed with the patient.  Questions have been answered.  All parties agreeable.   Sherri Sear, MD  04/10/2022, 8:33 AM

## 2022-04-10 NOTE — Transfer of Care (Signed)
Immediate Anesthesia Transfer of Care Note  Patient: Douglas Abbott.  Procedure(s) Performed: ESOPHAGOGASTRODUODENOSCOPY (EGD) WITH PROPOFOL  Patient Location: Endoscopy Unit  Anesthesia Type:General  Level of Consciousness: drowsy  Airway & Oxygen Therapy: Patient Spontanous Breathing and Patient connected to nasal cannula oxygen  Post-op Assessment: Report given to RN, Post -op Vital signs reviewed and stable and Patient moving all extremities  Post vital signs: Reviewed and stable  Last Vitals:  Vitals Value Taken Time  BP 130/94 04/10/22 0900  Temp 36.1 C 04/10/22 0900  Pulse 68 04/10/22 0900  Resp 31 04/10/22 0900  SpO2 98 % 04/10/22 0900  Vitals shown include unvalidated device data.  Last Pain:  Vitals:   04/10/22 0900  TempSrc: Tympanic  PainSc: Asleep         Complications: No notable events documented.

## 2022-04-10 NOTE — Op Note (Signed)
[facility] Gastroenterology Patient Name: Douglas Abbott Procedure Date: 04/10/2022 8:34 AM MRN: [id number] Account #: [id number] Date of Birth: 24-Jan-1966 Admit Type: Outpatient Age: 56 Room: [facility] ENDO ROOM 4 Gender: Male Note Status: Finalized Instrument Name: Upper Endoscope [id number] Procedure:             Upper GI endoscopy Indications:           Sore throat, , various food intolerances Providers:             Lin Landsman MD, MD Referring MD:          Serafina Royals FNP Medicines:             General Anesthesia Complications:         No immediate complications. Estimated blood loss: None. Procedure:             Pre-Anesthesia Assessment:                        - Prior to the procedure, a History and Physical was                         performed, and patient medications and allergies were                         reviewed. The patient is competent. The risks and                         benefits of the procedure and the sedation options and                         risks were discussed with the patient. All questions                         were answered and informed consent was obtained.                         Patient identification and proposed procedure were                         verified by the physician, the nurse, the                         anesthesiologist, the anesthetist and the technician                         in the pre-procedure area in the procedure room in the                         endoscopy suite. Mental Status Examination: alert and                         oriented. Airway Examination: normal oropharyngeal                         airway and neck mobility. Respiratory Examination:                         clear to auscultation. CV Examination: normal.  Prophylactic Antibiotics: The patient does not require                         prophylactic antibiotics. Prior Anticoagulants: The                         patient  has taken no previous anticoagulant or                         antiplatelet agents. ASA Grade Assessment: II - A                         patient with mild systemic disease. After reviewing                         the risks and benefits, the patient was deemed in                         satisfactory condition to undergo the procedure. The                         anesthesia plan was to use general anesthesia.                         Immediately prior to administration of medications,                         the patient was re-assessed for adequacy to receive                         sedatives. The heart rate, respiratory rate, oxygen                         saturations, blood pressure, adequacy of pulmonary                         ventilation, and response to care were monitored                         throughout the procedure. The physical status of the                         patient was re-assessed after the procedure.                        After obtaining informed consent, the endoscope was                         passed under direct vision. Throughout the procedure,                         the patient's blood pressure, pulse, and oxygen                         saturations were monitored continuously. The Endoscope                         was introduced through the mouth, and advanced to the  second part of duodenum. The upper GI endoscopy was                         accomplished without difficulty. The patient tolerated                         the procedure well. Findings:      The examined duodenum was normal. Biopsies were taken with a cold       forceps for histology.      The entire examined stomach was normal. Biopsies were taken with a cold       forceps for histology.      A small hiatal hernia was present.      The cardia and gastric fundus were normal on retroflexion.      The gastroesophageal junction and examined esophagus were normal.       Biopsies  were taken with a cold forceps for histology. Impression:            - Normal examined duodenum. Biopsied.                        - Normal stomach. Biopsied.                        - Small hiatal hernia.                        - Normal gastroesophageal junction and esophagus.                         Biopsied. Recommendation:        - Await pathology results.                        - Discharge patient to home (with escort).                        - Resume previous diet today.                        - Continue present medications. Procedure Code(s):     --- Professional ---                        [phone], Esophagogastroduodenoscopy, flexible,                         transoral; with biopsy, single or multiple Diagnosis Code(s):     --- Professional ---                        K44.9, Diaphragmatic hernia without obstruction or                         gangrene CPT copyright 2019 American Medical Association. All rights reserved. The codes documented in this report are preliminary and upon coder review may  be revised to meet current compliance requirements. Dr. Ulyess Mort Lin Landsman MD, MD 04/10/2022 9:00:01 AM This report has been signed electronically. Number of Addenda: 0 Note Initiated On: 04/10/2022 8:34 AM Estimated Blood Loss:  Estimated blood loss: none.      [facility]

## 2022-04-11 ENCOUNTER — Encounter: Payer: Self-pay | Admitting: Gastroenterology

## 2022-04-11 LAB — SURGICAL PATHOLOGY

## 2022-04-16 NOTE — Anesthesia Postprocedure Evaluation (Signed)
Anesthesia Post Note  Patient: Douglas Abbott.  Procedure(s) Performed: ESOPHAGOGASTRODUODENOSCOPY (EGD) WITH PROPOFOL  Anesthesia Type: General Anesthetic complications: no   No notable events documented.   Last Vitals:  Vitals:   04/10/22 0910 04/10/22 0920  BP: 139/90 (!) 168/91  Pulse: 66 60  Resp: 17 14  Temp:    SpO2: 99% 100%    Last Pain:  Vitals:   04/10/22 0920  TempSrc:   PainSc: 0-No pain                 Martha Clan

## 2022-05-01 ENCOUNTER — Other Ambulatory Visit: Payer: Self-pay | Admitting: Nurse Practitioner

## 2022-05-01 DIAGNOSIS — J4540 Moderate persistent asthma, uncomplicated: Secondary | ICD-10-CM

## 2022-05-02 NOTE — Telephone Encounter (Signed)
Requested medication (s) are due for refill today - no  Requested medication (s) are on the active medication list -yes  Future visit scheduled -no  Last refill: 01/21/22 6.7g  3RF  Notes to clinic: Attempted to call to follow up with usage of inhaler- early request- no answer- on both numbers. Sent for provider review of early request  Requested Prescriptions  Pending Prescriptions Disp Refills   albuterol (VENTOLIN HFA) 108 (90 Base) MCG/ACT inhaler [Pharmacy Med Name: ALBUTEROL HFA INH (200 PUFFS) 6.7GM] 6.7 g 3    Sig: INHALE 1 TO 2 PUFFS INTO THE LUNGS EVERY 6 HOURS AS NEEDED FOR WHEEZING OR SHORTNESS OF BREATH     Pulmonology:  Beta Agonists 2 Failed - 05/01/2022 11:02 AM      Failed - Last BP in normal range    BP Readings from Last 1 Encounters:  04/10/22 (!) 168/91         Passed - Last Heart Rate in normal range    Pulse Readings from Last 1 Encounters:  04/10/22 60         Passed - Valid encounter within last 12 months    Recent Outpatient Visits           3 months ago Moderate persistent chronic asthma without complication   [facility] Bo Merino, FNP   4 months ago Nasal polyps   [facility] Serafina Royals F, FNP   5 months ago Moderate persistent chronic asthma without complication   [facility] Bo Merino, FNP   6 months ago Viral upper respiratory tract infection   [facility] Bo Merino, FNP   9 months ago Need for influenza vaccination   [facility] Myles Gip, DO                 Requested Prescriptions  Pending Prescriptions Disp Refills   albuterol (VENTOLIN HFA) 108 (90 Base) MCG/ACT inhaler [Pharmacy Med Name: ALBUTEROL HFA INH (200 PUFFS) 6.7GM] 6.7 g 3    Sig: INHALE 1 TO 2 PUFFS INTO THE LUNGS EVERY 6 HOURS AS NEEDED FOR WHEEZING OR SHORTNESS OF BREATH     Pulmonology:  Beta Agonists 2 Failed - 05/01/2022 11:02 AM       Failed - Last BP in normal range    BP Readings from Last 1 Encounters:  04/10/22 (!) 168/91         Passed - Last Heart Rate in normal range    Pulse Readings from Last 1 Encounters:  04/10/22 60         Passed - Valid encounter within last 12 months    Recent Outpatient Visits           3 months ago Moderate persistent chronic asthma without complication   [place], FNP   4 months ago Nasal polyps   [place], FNP   5 months ago Moderate persistent chronic asthma without complication   [facility] Bo Merino, FNP   6 months ago Viral upper respiratory tract infection   [facility] Bo Merino, FNP   9 months ago Need for influenza vaccination   [facility] Myles Gip, DO

## 2022-05-03 ENCOUNTER — Other Ambulatory Visit: Payer: Self-pay

## 2022-07-22 ENCOUNTER — Telehealth: Payer: 59 | Admitting: Emergency Medicine

## 2022-07-22 DIAGNOSIS — T7840XA Allergy, unspecified, initial encounter: Secondary | ICD-10-CM | POA: Diagnosis not present

## 2022-07-22 NOTE — Progress Notes (Signed)
E visit for Allergic Reaction  If you are feeling better, you probably don't need any further treatment.  If you wanted to be on the safe side, you could continue taking a Benadryl every 6-8 hours for the next day or two.  If your symptoms worsen, ie: rash, throat swelling sensation, vomiting, diarrhea, then you need to be seen right away.  GET HELP RIGHT AWAY IF:  If your symptoms do not improve within 10 days You become short of breath You develop yellow or green discharge from your nose for over 3 days You have coughing fits  MAKE SURE YOU:  Understand these instructions Will watch your condition Will get help right away if you are not doing well or get worse  Thank you for choosing an e-visit. Your e-visit answers were reviewed by a board certified advanced clinical practitioner to complete your personal care plan. Depending upon the condition, your plan could have included both over the counter or prescription medications. Please review your pharmacy choice. Be sure that the pharmacy you have chosen is open so that you can pick up your prescription now.  If there is a problem you may message your provider in [place] to have the prescription routed to another pharmacy. Your safety is important to [place]. If you have drug allergies check your prescription carefully.  For the next 24 hours, you can use MyChart to ask questions about today's visit, request a non-urgent call back, or ask for a work or school excuse from your e-visit provider. You will get an email in the next two days asking about your experience. I hope that your e-visit has been valuable and will speed your recovery.  Approximately 5 minutes was used in reviewing the patient's chart, questionnaire, prescribing medications, and documentation.

## 2022-08-16 ENCOUNTER — Telehealth: Payer: 59 | Admitting: Physician Assistant

## 2022-08-16 DIAGNOSIS — J4531 Mild persistent asthma with (acute) exacerbation: Secondary | ICD-10-CM | POA: Diagnosis not present

## 2022-08-16 MED ORDER — PREDNISONE 20 MG PO TABS: 40.0000 mg | ORAL_TABLET | Freq: Every day | ORAL | 0 refills | Status: DC

## 2022-08-16 NOTE — Progress Notes (Signed)
Visit for Asthma  Based on what you have shared with me, it looks like you may have a flare up of your asthma.  Asthma is a chronic (ongoing) lung disease which results in airway obstruction, inflammation and hyper-responsiveness.   Asthma symptoms vary from person to person, with common symptoms including nighttime awakening and decreased ability to participate in normal activities as a result of shortness of breath. It is often triggered by changes in weather, changes in the season, changes in air temperature, or inside (home, school, daycare or work) allergens such as animal dander, mold, mildew, woodstoves or cockroaches.   It can also be triggered by hormonal changes, extreme emotion, physical exertion or an upper respiratory tract illness.     It is important to identify the trigger, and then eliminate or avoid the trigger if possible.   If you have been prescribed medications to be taken on a regular basis, it is important to follow the asthma action plan and to follow guidelines to adjust medication in response to increasing symptoms of decreased peak expiratory flow rate  Treatment: I have prescribed: Prednisone '40mg'$  by mouth per day for 5 - 7 days  HOME CARE Only take medications as instructed by your medical team. Consider wearing a mask or scarf to improve breathing air temperature have been shown to decrease irritation and decrease exacerbations Get rest. Taking a steamy shower or using a humidifier may help nasal congestion sand ease sore throat pain. You can place a towel over your head and breathe in the steam from hot water coming from a faucet. Using a saline nasal spray works much the same way.  Cough drops, hare candies and sore throat lozenges may ease your cough.  Avoid close contacts especially the very you and the elderly Cover your mouth if you cough or  sneeze Always remember to wash your hands.    GET HELP RIGHT AWAY IF: You develop worsening symptoms; breathlessness at rest, drowsy, confused or agitated, unable to speak in full sentences You have coughing fits You develop a severe headache or visual changes You develop shortness of breath, difficulty breathing or start having chest pain Your symptoms persist after you have completed your treatment plan If your symptoms do not improve within 10 days  MAKE SURE YOU Understand these instructions. Will watch your condition. Will get help right away if you are not doing well or get worse.   Your e-visit answers were reviewed by a board certified advanced clinical practitioner to complete your personal care plan, Depending upon the condition, your plan could have included both over the counter or prescription medications.   Please review your pharmacy choice. Your safety is important to [place]. If you have drug allergies check your prescription carefully.  You can use MyChart to ask questions about today's visit, request a non-urgent  call back, or ask for a work or school excuse for 24 hours related to this e-Visit. If it has been greater than 24 hours you will need to follow up with your provider, or enter a new e-Visit to address those concerns.   You will get an e-mail in the next two days asking about your experience. I hope that your e-visit has been valuable and will speed your recovery. Thank you for using e-visits.  I have spent 5 minutes in review of e-visit questionnaire, review and updating patient chart, medical decision making and response to patient.   Mar Daring, PA-C

## 2022-10-01 ENCOUNTER — Telehealth: Payer: 59 | Admitting: Nurse Practitioner

## 2022-10-01 DIAGNOSIS — R051 Acute cough: Secondary | ICD-10-CM | POA: Diagnosis not present

## 2022-10-01 MED ORDER — ALBUTEROL SULFATE HFA 108 (90 BASE) MCG/ACT IN AERS: 2.0000 | INHALATION_SPRAY | Freq: Four times a day (QID) | RESPIRATORY_TRACT | 0 refills | Status: DC | PRN

## 2022-10-01 MED ORDER — BENZONATATE 100 MG PO CAPS: 100.0000 mg | ORAL_CAPSULE | Freq: Three times a day (TID) | ORAL | 0 refills | Status: DC | PRN

## 2022-10-01 NOTE — Progress Notes (Signed)
We are sorry that you are not feeling well.  Here is how we plan to help!  Based on your presentation I believe you most likely have A cough due to a virus.  This is called viral bronchitis and is best treated by rest, plenty of fluids and control of the cough.  You may use Ibuprofen or Tylenol as directed to help your symptoms.     In addition you may use A prescription cough medication called Tessalon Perles '100mg'$ . You may take 1-2 capsules every 8 hours as needed for your cough.  Your cough is likely from a virus, this may be flu, COVID or other viruses known to cause upper respiratory infections. It is important to use your inhaler every 4-6 hours. We will send in a prescription for a cough medication as well.   If you would like to take a home COVID test and are positive please schedule a follow up with [place].   Providers prescribe antibiotics to treat infections caused by bacteria. Antibiotics are very powerful in treating bacterial infections when they are used properly. To maintain their effectiveness, they should be used only when necessary. Overuse of antibiotics has resulted in the development of superbugs that are resistant to treatment!    After careful review of your answers, I would not recommend an antibiotic for your condition.  Antibiotics are not effective against viruses and therefore should not be used to treat them. Common examples of infections caused by viruses include colds and flu   From your responses in the eVisit questionnaire you describe inflammation in the upper respiratory tract which is causing a significant cough.  This is commonly called Bronchitis and has four common causes:   Allergies Viral Infections Acid Reflux Bacterial Infection Allergies, viruses and acid reflux are treated by controlling symptoms or eliminating the cause. An example might be a cough caused by taking certain blood pressure medications. You stop the cough by changing the medication. Another  example might be a cough caused by acid reflux. Controlling the reflux helps control the cough.  USE OF BRONCHODILATOR ("RESCUE") INHALERS: There is a risk from using your bronchodilator too frequently.  The risk is that over-reliance on a medication which only relaxes the muscles surrounding the breathing tubes can reduce the effectiveness of medications prescribed to reduce swelling and congestion of the tubes themselves.  Although you feel brief relief from the bronchodilator inhaler, your asthma may actually be worsening with the tubes becoming more swollen and filled with mucus.  This can delay other crucial treatments, such as oral steroid medications. If you need to use a bronchodilator inhaler daily, several times per day, you should discuss this with your provider.  There are probably better treatments that could be used to keep your asthma under control.     HOME CARE Only take medications as instructed by your medical team. Complete the entire course of an antibiotic. Drink plenty of fluids and get plenty of rest. Avoid close contacts especially the very young and the elderly Cover your mouth if you cough or cough into your sleeve. Always remember to wash your hands A steam or ultrasonic humidifier can help congestion.   GET HELP RIGHT AWAY IF: You develop worsening fever. You become short of breath You cough up blood. Your symptoms persist after you have completed your treatment plan MAKE SURE YOU  Understand these instructions. Will watch your condition. Will get help right away if you are not doing well or get worse.  Thank you for choosing an e-visit.  Your e-visit answers were reviewed by a board certified advanced clinical practitioner to complete your personal care plan. Depending upon the condition, your plan could have included both over the counter or prescription medications.  Please review your pharmacy choice. Make sure the pharmacy is open so you can pick up  prescription now. If there is a problem, you may contact your provider through [employer] and have the prescription routed to another pharmacy.  Your safety is important to [place]. If you have drug allergies check your prescription carefully.   For the next 24 hours you can use MyChart to ask questions about today's visit, request a non-urgent call back, or ask for a work or school excuse. You will get an email in the next two days asking about your experience. I hope that your e-visit has been valuable and will speed your recovery.   Meds ordered this encounter  Medications   benzonatate (TESSALON) 100 MG capsule    Sig: Take 1 capsule (100 mg total) by mouth 3 (three) times daily as needed.    Dispense:  30 capsule    Refill:  0   albuterol (VENTOLIN HFA) 108 (90 Base) MCG/ACT inhaler    Sig: Inhale 2 puffs into the lungs every 6 (six) hours as needed for wheezing or shortness of breath.    Dispense:  8 g    Refill:  0     I spent approximately 5 minutes reviewing the patient's history, current symptoms and coordinating their care today.

## 2022-10-02 ENCOUNTER — Telehealth: Payer: Self-pay | Admitting: Nurse Practitioner

## 2022-10-02 ENCOUNTER — Ambulatory Visit (INDEPENDENT_AMBULATORY_CARE_PROVIDER_SITE_OTHER): Payer: 59 | Admitting: Nurse Practitioner

## 2022-10-02 ENCOUNTER — Other Ambulatory Visit: Payer: Self-pay

## 2022-10-02 ENCOUNTER — Encounter: Payer: Self-pay | Admitting: Nurse Practitioner

## 2022-10-02 VITALS — BP 118/84 | HR 96 | Temp 98.6°F | Resp 18 | Ht 73.0 in | Wt 162.5 lb

## 2022-10-02 DIAGNOSIS — J4540 Moderate persistent asthma, uncomplicated: Secondary | ICD-10-CM

## 2022-10-02 DIAGNOSIS — J069 Acute upper respiratory infection, unspecified: Secondary | ICD-10-CM | POA: Diagnosis not present

## 2022-10-02 MED ORDER — PREDNISONE 10 MG (21) PO TBPK: ORAL_TABLET | ORAL | 0 refills | Status: DC

## 2022-10-02 NOTE — Telephone Encounter (Signed)
Called patient mailbox  full.  Patient would have to be seen here since we did not see him we cannot take him out of work.

## 2022-10-02 NOTE — Telephone Encounter (Signed)
Pt has an appt today     Copied from [place] [phone] [phone]. Topic: General - Other >> Oct 02, 2022  9:15 AM Cyndi Bender wrote: Reason for CRM: Pt had telehealth visit yesterday and is requesting a work note to excuse him from work until Monday of next week as the medication has not had time to start working. Cb# [phone]

## 2022-10-02 NOTE — Progress Notes (Signed)
BP 118/84   Pulse 96   Temp 98.6 F (37 C) (Oral)   Resp 18   Ht '6\' 1"'$  (1.854 m)   Wt 162 lb 8 oz (73.7 kg)   SpO2 98%   BMI 21.44 kg/m    Subjective:    Patient ID: Douglas Glaze., male    DOB: 27-Jul-1966, 55 y.o.   MRN: [id number]  HPI: Douglas Iannello. is a 56 y.o. male  Chief Complaint  Patient presents with   Cough    Chills, fatigue, headache for 3 days   JXB:JYNWGNFA started three days ago, Monday night. He says that he has a headache, cough, nasal congestion, chills and fatigue. He did an evisit yesterday and was given a prescription for tessalon perls and albuterol. He denies any shortness of breath but says that he can tell his breathing is just not his norm. No documented fever but has felt feverish.   Discussed testing patient for covid and he agreed.  Covid pcr pending.  Discussed treatment if positive and he would like to be treated. Recommend pushing fluids, getting rest, start taking zyrtec, flonase, mucinex, vitamin d, vitamin c, and zinc.  Will also send in steroid taper.  Continue asthma medication.   Asthma: he currently uses Singulair, Symbicort and albuterol. Patient reports that his breathing is okay but it is not as easy as usual.  Will send in steroid taper.   Relevant past medical, surgical, family and social history reviewed and updated as indicated. Interim medical history since our last visit reviewed. Allergies and medications reviewed and updated.  Review of Systems  Constitutional: Negative for fever or weight change.  Respiratory: positive for cough and negative for shortness of breath.   Cardiovascular: Negative for chest pain or palpitations.  Gastrointestinal: Negative for abdominal pain, no bowel changes.  Musculoskeletal: Negative for gait problem or joint swelling.  Skin: Negative for rash.  Neurological: Negative for dizziness, positive for headache.  No other specific complaints in a complete review of systems (except as  listed in HPI above).      Objective:    BP 118/84   Pulse 96   Temp 98.6 F (37 C) (Oral)   Resp 18   Ht '6\' 1"'$  (1.854 m)   Wt 162 lb 8 oz (73.7 kg)   SpO2 98%   BMI 21.44 kg/m   Wt Readings from Last 3 Encounters:  10/02/22 162 lb 8 oz (73.7 kg)  04/10/22 170 lb 5.6 oz (77.3 kg)  04/03/22 174 lb 8 oz (79.2 kg)    Physical Exam  Constitutional: Patient appears well-developed and well-nourished.  No distress.  HEENT: head atraumatic, normocephalic, pupils equal and reactive to light, ears TMs clear, neck supple, throat within normal limits Cardiovascular: Normal rate, regular rhythm and normal heart sounds.  No murmur heard. No BLE edema. Pulmonary/Chest: Effort normal and breath sounds normal. No respiratory distress. Abdominal: Soft.  There is no tenderness. Psychiatric: Patient has a normal mood and affect. behavior is normal. Judgment and thought content normal.   Results for orders placed or performed during the hospital encounter of 04/10/22  Surgical pathology  Result Value Ref Range   SURGICAL PATHOLOGY      SURGICAL PATHOLOGY CASE: [id number] PATIENT: Charlesetta Garibaldi Surgical Pathology Report     Specimen Submitted: A. Duodenum; cbx B. Stomach, random; cbx C. Esophagus; cbx  Clinical History: Food intolerance in adult K90.49.  Hiatal hernia    DIAGNOSIS: A. DUODENUM; COLD BIOPSY: -  ENTERIC MUCOSA WITH PRESERVED VILLOUS ARCHITECTURE AND NO SIGNIFICANT HISTOPATHOLOGIC CHANGE. - NEGATIVE FOR FEATURES OF CELIAC, DYSPLASIA, AND MALIGNANCY.  B. STOMACH, RANDOM; COLD BIOPSY: - GASTRIC ANTRAL AND OXYNTIC MUCOSA WITH NO SIGNIFICANT HISTOPATHOLOGIC CHANGE. - NEGATIVE FOR H. PYLORI, DYSPLASIA, AND MALIGNANCY.  C. ESOPHAGUS; COLD BIOPSY: - BENIGN SQUAMOUS MUCOSA WITH NO SIGNIFICANT HISTOPATHOLOGIC CHANGE. - NO INCREASE IN INTRAEPITHELIAL EOSINOPHILS (LESS THAN 2 PER HPF). - NEGATIVE FOR DYSPLASIA AND MALIGNANCY.  GROSS DESCRIPTION: A. Labeled: Duodenum  cbxs Received: Formalin Collection time: 8:51 AM on 04/10/2022 Placed into formalin time: 8:51 AM on 04/10/2022  Tissue fragment(s): Multiple Size: Aggregate, 0.6 x 0.6 x 0.1 cm Description: Tan soft tissue fragments Entirely submitted in 1 cassette.  B. Labeled: Random stomach cbxs Received: Formalin Collection time: 8:51 AM on 04/10/2022 Placed into formalin time: 8:51 AM on 04/10/2022 Tissue fragment(s): Multiple Size: Aggregate, 1.2 x 0.3 x 0.1 cm Description: Tan soft tissue fragments Entirely submitted in 1 cassette.  C. Labeled: Esophagus cbxs Received: Formalin Collection time: 8:52 AM on 04/10/2022 Placed into formalin time: 8:52 AM on 04/10/2022 Tissue fragment(s): Multiple Size: Aggregate, 0.9 x 0.3 x 0.1 cm Description: Pale pink soft tissue fragments Entirely submitted in 1 cassette.  CM 04/10/2022  Final Diagnosis performed by Allena Napoleon, MD.   Electronically signed 04/11/2022 10:18:33AM The electronic signature indicates that the named Attending Pathologist has evaluated the specimen Technical component performed at [facility], [address], [place], [place]  [address] Lab: [phone] Dir: Rush Farmer, MD, MMM  Professional component performed at [facility], [facility], [place], [place], [place] [address] Lab: [phone] Dir: Kathi Simpers, MD       Assessment & Plan:   Problem List Items Addressed This Visit       Respiratory   Asthma, chronic (Chronic)    Continue using Symbicort, albuterol and Singulair. Steroid taper also sent in      Relevant Medications   predniSONE (STERAPRED UNI-PAK 21 TAB) 10 MG (21) TBPK tablet   Other Visit Diagnoses     Viral upper respiratory tract infection    -  Primary   pushing fluids, getting rest, start taking zyrtec, flonase, mucinex, vitamin d, vitamin c, and zinc.  Will also send in steroid taper. covid pcr pending   Relevant Medications   predniSONE (STERAPRED UNI-PAK 21 TAB) 10 MG  (21) TBPK tablet   Other Relevant Orders   Novel Coronavirus, NAA ([facility])        Follow up plan: Return if symptoms worsen or fail to improve.

## 2022-10-02 NOTE — Assessment & Plan Note (Signed)
Continue using Symbicort, albuterol and Singulair. Steroid taper also sent in

## 2022-10-03 LAB — NOVEL CORONAVIRUS, NAA: SARS-CoV-2, NAA: NOT DETECTED

## 2022-10-15 ENCOUNTER — Ambulatory Visit: Payer: 59 | Admitting: Internal Medicine

## 2022-10-16 NOTE — Progress Notes (Unsigned)
   There were no vitals taken for this visit.   Subjective:    Patient ID: Douglas Abbott., male    DOB: Jun 06, 1966, 57 y.o.   MRN: [id number]  HPI: Douglas Abbott. is a 57 y.o. male  No chief complaint on file.   Relevant past medical, surgical, family and social history reviewed and updated as indicated. Interim medical history since our last visit reviewed. Allergies and medications reviewed and updated.  Review of Systems  Constitutional: Negative for fever or weight change.  Respiratory: Negative for cough and shortness of breath.   Cardiovascular: Negative for chest pain or palpitations.  Gastrointestinal: Negative for abdominal pain, no bowel changes.  Musculoskeletal: Negative for gait problem or joint swelling.  Skin: Negative for rash.  Neurological: Negative for dizziness or headache.  No other specific complaints in a complete review of systems (except as listed in HPI above).      Objective:    There were no vitals taken for this visit.  Wt Readings from Last 3 Encounters:  10/02/22 162 lb 8 oz (73.7 kg)  04/10/22 170 lb 5.6 oz (77.3 kg)  04/03/22 174 lb 8 oz (79.2 kg)    Physical Exam  Constitutional: Patient appears well-developed and well-nourished. Obese *** No distress.  HEENT: head atraumatic, normocephalic, pupils equal and reactive to light, ears ***, neck supple, throat within normal limits Cardiovascular: Normal rate, regular rhythm and normal heart sounds.  No murmur heard. No BLE edema. Pulmonary/Chest: Effort normal and breath sounds normal. No respiratory distress. Abdominal: Soft.  There is no tenderness. Psychiatric: Patient has a normal mood and affect. behavior is normal. Judgment and thought content normal.  Results for orders placed or performed in visit on 10/02/22  Novel Coronavirus, NAA ([facility])   Specimen: Nasopharyngeal(NP) swabs in vial transport medium   Nasopharynge  Previous  Result Value Ref Range   SARS-CoV-2, NAA  Not Detected Not Detected      Assessment & Plan:   Problem List Items Addressed This Visit   None    Follow up plan: No follow-ups on file.

## 2022-10-17 ENCOUNTER — Other Ambulatory Visit: Payer: Self-pay

## 2022-10-17 ENCOUNTER — Ambulatory Visit (INDEPENDENT_AMBULATORY_CARE_PROVIDER_SITE_OTHER): Payer: 59 | Admitting: Nurse Practitioner

## 2022-10-17 ENCOUNTER — Encounter: Payer: Self-pay | Admitting: Nurse Practitioner

## 2022-10-17 VITALS — BP 128/76 | HR 87 | Temp 98.6°F | Resp 18 | Ht 73.0 in | Wt 173.2 lb

## 2022-10-17 DIAGNOSIS — M545 Low back pain, unspecified: Secondary | ICD-10-CM

## 2022-10-17 LAB — POCT URINALYSIS DIPSTICK
Bilirubin, UA: NEGATIVE
Glucose, UA: NEGATIVE
Ketones, UA: NEGATIVE
Leukocytes, UA: NEGATIVE
Nitrite, UA: NEGATIVE
Protein, UA: POSITIVE — AB
Spec Grav, UA: 1.02 (ref 1.010–1.025)
Urobilinogen, UA: NEGATIVE E.U./dL — AB
pH, UA: 5 (ref 5.0–8.0)

## 2022-10-18 LAB — URINE CULTURE
MICRO NUMBER:: 14388897
Result:: NO GROWTH
SPECIMEN QUALITY:: ADEQUATE

## 2022-10-23 ENCOUNTER — Other Ambulatory Visit: Payer: Self-pay | Admitting: Nurse Practitioner

## 2022-10-23 DIAGNOSIS — R051 Acute cough: Secondary | ICD-10-CM

## 2022-11-04 NOTE — Telephone Encounter (Unsigned)
Copied from [place] [id number]. Topic: General - Other >> Nov 01, 2022  4:26 PM TUUEKCMK J wrote: Reason for CRM: pt called in for assistance. Pt says that he received a letter from [place] in regards to his leave. Pt says that he was told that there is a few things that were not completed on his paperwork in order to process. Pt says that he was told that paper work was re-faxed to provider for completion. Pt says that he will be happy to help in any way that provider may need. Pt would like to have this updated when possible.

## 2023-01-02 ENCOUNTER — Other Ambulatory Visit: Payer: Self-pay | Admitting: Nurse Practitioner

## 2023-01-02 DIAGNOSIS — K219 Gastro-esophageal reflux disease without esophagitis: Secondary | ICD-10-CM

## 2023-01-03 NOTE — Telephone Encounter (Signed)
Requested by interface surescripts.  Requested Prescriptions  Pending Prescriptions Disp Refills   omeprazole (PRILOSEC) 20 MG capsule [Pharmacy Med Name: OMEPRAZOLE 20MG  CAPSULES] 180 capsule 0    Sig: TAKE 1 CAPSULE(20 MG) BY MOUTH TWICE DAILY     Gastroenterology: Proton Pump Inhibitors Passed - 01/02/2023  4:04 PM      Passed - Valid encounter within last 12 months    Recent Outpatient Visits           2 months ago Acute bilateral low back pain without sciatica   [facility] Bo Merino, FNP   3 months ago Viral upper respiratory tract infection   [facility] Bo Merino, FNP   11 months ago Moderate persistent chronic asthma without complication   [facility] Bo Merino, FNP   1 year ago Nasal polyps   [facility] Bo Merino, FNP   1 year ago Moderate persistent chronic asthma without complication   [facility] Bo Merino, [place]

## 2023-02-18 ENCOUNTER — Other Ambulatory Visit: Payer: Self-pay | Admitting: Nurse Practitioner

## 2023-02-18 DIAGNOSIS — J4540 Moderate persistent asthma, uncomplicated: Secondary | ICD-10-CM

## 2023-02-18 NOTE — Telephone Encounter (Signed)
Requested Prescriptions  Pending Prescriptions Disp Refills   albuterol (VENTOLIN HFA) 108 (90 Base) MCG/ACT inhaler [Pharmacy Med Name: ALBUTEROL HFA INH (200 PUFFS) 6.7GM] 8 g 2    Sig: INHALE 1 TO 2 PUFFS INTO THE LUNGS EVERY 6 HOURS AS NEEDED FOR WHEEZING OR SHORTNESS OF BREATH     Pulmonology:  Beta Agonists 2 Passed - 02/18/2023  5:57 PM      Passed - Last BP in normal range    BP Readings from Last 1 Encounters:  10/17/22 128/76         Passed - Last Heart Rate in normal range    Pulse Readings from Last 1 Encounters:  10/17/22 87         Passed - Valid encounter within last 12 months    Recent Outpatient Visits           4 months ago Acute bilateral low back pain without sciatica   [facility] Berniece Salines, FNP   4 months ago Viral upper respiratory tract infection   [facility] Berniece Salines, FNP   1 year ago Moderate persistent chronic asthma without complication   [facility] Berniece Salines, FNP   1 year ago Nasal polyps   [facility] Berniece Salines, FNP   1 year ago Moderate persistent chronic asthma without complication   [facility] Berniece Salines, [place]

## 2023-02-23 ENCOUNTER — Other Ambulatory Visit: Payer: Self-pay | Admitting: Nurse Practitioner

## 2023-02-23 DIAGNOSIS — R3916 Straining to void: Secondary | ICD-10-CM

## 2023-02-24 NOTE — Telephone Encounter (Signed)
Requested medication (s) are due for refill today - expired Rx  Requested medication (s) are on the active medication list -yes  Future visit scheduled -no  Last refill: 01/21/22 #90 3RF  Notes to clinic: expired Rx  Requested Prescriptions  Pending Prescriptions Disp Refills   tamsulosin (FLOMAX) 0.4 MG CAPS capsule [Pharmacy Med Name: TAMSULOSIN 0.4MG  CAPSULES] 90 capsule 3    Sig: TAKE 1 CAPSULE(0.4 MG) BY MOUTH DAILY     Urology: Alpha-Adrenergic Blocker Failed - 02/23/2023 12:01 PM      Failed - PSA in normal range and within 360 days    PSA  Date Value Ref Range Status  07/23/2021 1.32 < OR = 4.00 ng/mL Final    Comment:    The total PSA value from this assay system is  standardized against the WHO standard. The test  result will be approximately 20% lower when compared  to the equimolar-standardized total PSA (Beckman  Coulter). Comparison of serial PSA results should be  interpreted with this fact in mind. . This test was performed using the Siemens  chemiluminescent method. Values obtained from  different assay methods cannot be used interchangeably. PSA levels, regardless of value, should not be interpreted as absolute evidence of the presence or absence of disease.    Prostate Specific Ag, Serum  Date Value Ref Range Status  04/05/2015 1.3 0.0 - 4.0 ng/mL Final    Comment:    Roche ECLIA methodology. According to the American Urological Association, Serum PSA should decrease and remain at undetectable levels after radical prostatectomy. The AUA defines biochemical recurrence as an initial PSA value 0.2 ng/mL or greater followed by a subsequent confirmatory PSA value 0.2 ng/mL or greater. Values obtained with different assay methods or kits cannot be used interchangeably. Results cannot be interpreted as absolute evidence of the presence or absence of malignant disease.          Passed - Last BP in normal range    BP Readings from Last 1 Encounters:   10/17/22 128/76         Passed - Valid encounter within last 12 months    Recent Outpatient Visits           4 months ago Acute bilateral low back pain without sciatica   [facility] Berniece Salines, FNP   4 months ago Viral upper respiratory tract infection   [facility] Berniece Salines, FNP   1 year ago Moderate persistent chronic asthma without complication   [facility] Berniece Salines, FNP   1 year ago Nasal polyps   [facility] Berniece Salines, FNP   1 year ago Moderate persistent chronic asthma without complication   [facility] Berniece Salines, FNP                 Requested Prescriptions  Pending Prescriptions Disp Refills   tamsulosin (FLOMAX) 0.4 MG CAPS capsule [Pharmacy Med Name: TAMSULOSIN 0.4MG  CAPSULES] 90 capsule 3    Sig: TAKE 1 CAPSULE(0.4 MG) BY MOUTH DAILY     Urology: Alpha-Adrenergic Blocker Failed - 02/23/2023 12:01 PM      Failed - PSA in normal range and within 360 days    PSA  Date Value Ref Range Status  07/23/2021 1.32 < OR = 4.00 ng/mL Final    Comment:    The total PSA value from this assay system is  standardized against  the WHO standard. The test  result will be approximately 20% lower when compared  to the equimolar-standardized total PSA (Beckman  Coulter). Comparison of serial PSA results should be  interpreted with this fact in mind. . This test was performed using the Siemens  chemiluminescent method. Values obtained from  different assay methods cannot be used interchangeably. PSA levels, regardless of value, should not be interpreted as absolute evidence of the presence or absence of disease.    Prostate Specific Ag, Serum  Date Value Ref Range Status  04/05/2015 1.3 0.0 - 4.0 ng/mL Final    Comment:    Roche ECLIA methodology. According to the American Urological Association, Serum PSA  should decrease and remain at undetectable levels after radical prostatectomy. The AUA defines biochemical recurrence as an initial PSA value 0.2 ng/mL or greater followed by a subsequent confirmatory PSA value 0.2 ng/mL or greater. Values obtained with different assay methods or kits cannot be used interchangeably. Results cannot be interpreted as absolute evidence of the presence or absence of malignant disease.          Passed - Last BP in normal range    BP Readings from Last 1 Encounters:  10/17/22 128/76         Passed - Valid encounter within last 12 months    Recent Outpatient Visits           4 months ago Acute bilateral low back pain without sciatica   [facility] Berniece Salines, FNP   4 months ago Viral upper respiratory tract infection   [facility] Berniece Salines, FNP   1 year ago Moderate persistent chronic asthma without complication   [facility] Berniece Salines, FNP   1 year ago Nasal polyps   [facility] Berniece Salines, FNP   1 year ago Moderate persistent chronic asthma without complication   [facility] Berniece Salines, [place]

## 2023-03-11 ENCOUNTER — Ambulatory Visit: Payer: Self-pay

## 2023-03-11 NOTE — Telephone Encounter (Signed)
  Chief Complaint: Dizziness when he gets up quickly Symptoms: Very lightheaded at times Frequency: several months Pertinent Negatives: Patient denies SOB,  Disposition: [] ED /[] Urgent Care (no appt availability in office) / [x] Appointment(In office/virtual)/ []  [place] [facility]/ [] Home Care/ [] Refused Recommended Disposition /[] [place] [facility]/ []  Follow-up with PCP Additional Notes: Pt has noticed becoming dizzy when he gets up quickly. This has been going on for months. HE does not have any other S/S. Pt does not check his bp. Offered appt for tomorrow morning, but pt unable to take this appt d/t work. Pt has taken appt for Thurs afternoon. Pt will monitor s/s and call back or seek immediate care if needed.    Summary: Dizziness   Patient states that he has been having dizzy spells every time he bends over.     Reason for Disposition  [1] MODERATE dizziness (e.g., interferes with normal activities) AND [2] has NOT been evaluated by doctor (or NP/PA) for this  (Exception: Dizziness caused by heat exposure, sudden standing, or poor fluid intake.)  Answer Assessment - Initial Assessment Questions 1. DESCRIPTION: "Describe your dizziness."     Very lightheaded 2. LIGHTHEADED: "Do you feel lightheaded?" (e.g., somewhat faint, woozy, weak upon standing)     Upon standing 3. VERTIGO: "Do you feel like either you or the room is spinning or tilting?" (i.e. vertigo)     Real lightheaded 4. SEVERITY: "How bad is it?"  "Do you feel like you are going to faint?" "Can you stand and walk?"   - MILD: Feels slightly dizzy, but walking normally.   - MODERATE: Feels unsteady when walking, but not falling; interferes with normal activities (e.g., school, work).   - SEVERE: Unable to walk without falling, or requires assistance to walk without falling; feels like passing out now.      mild 5. ONSET:  "When did the dizziness begin?"     A couple months  Protocols used: Dizziness -  Lightheadedness-A-AH

## 2023-03-12 NOTE — Telephone Encounter (Signed)
Please make patient appointment if he has not been seen anywhere else

## 2023-03-13 ENCOUNTER — Ambulatory Visit (INDEPENDENT_AMBULATORY_CARE_PROVIDER_SITE_OTHER): Payer: 59 | Admitting: Nurse Practitioner

## 2023-03-13 ENCOUNTER — Other Ambulatory Visit: Payer: Self-pay

## 2023-03-13 ENCOUNTER — Other Ambulatory Visit: Payer: Self-pay | Admitting: Nurse Practitioner

## 2023-03-13 ENCOUNTER — Encounter: Payer: Self-pay | Admitting: Nurse Practitioner

## 2023-03-13 ENCOUNTER — Telehealth: Payer: Self-pay

## 2023-03-13 ENCOUNTER — Ambulatory Visit
Admission: RE | Admit: 2023-03-13 | Discharge: 2023-03-13 | Disposition: A | Discharge status: Home / Self Care | Payer: 59 | Source: Ambulatory Visit | Attending: Nurse Practitioner | Admitting: Nurse Practitioner

## 2023-03-13 VITALS — BP 128/76 | HR 92 | Temp 97.9°F | Resp 18 | Ht 73.0 in | Wt 169.9 lb

## 2023-03-13 DIAGNOSIS — Z130 Encounter for screening for diseases of the blood and blood-forming organs and certain disorders involving the immune mechanism: Secondary | ICD-10-CM | POA: Diagnosis not present

## 2023-03-13 DIAGNOSIS — Z13220 Encounter for screening for lipoid disorders: Secondary | ICD-10-CM

## 2023-03-13 DIAGNOSIS — Z131 Encounter for screening for diabetes mellitus: Secondary | ICD-10-CM

## 2023-03-13 DIAGNOSIS — R42 Dizziness and giddiness: Secondary | ICD-10-CM | POA: Diagnosis not present

## 2023-03-13 DIAGNOSIS — M79662 Pain in left lower leg: Secondary | ICD-10-CM

## 2023-03-13 DIAGNOSIS — J339 Nasal polyp, unspecified: Secondary | ICD-10-CM

## 2023-03-13 NOTE — Progress Notes (Signed)
BP 128/76   Pulse 92   Temp 97.9 F (36.6 C) (Oral)   Resp 18   Ht 6\' 1"  (1.854 m)   Wt 169 lb 14.4 oz (77.1 kg)   SpO2 97%   BMI 22.42 kg/m    Subjective:    Patient ID: Douglas Rued., male    DOB: 12-05-1965, 57 y.o.   MRN: [id number]  HPI: Douglas Josh. is a 57 y.o. male  Chief Complaint  Patient presents with   Dizziness    Started after changing glasses   Leg Pain    left   Left calf pain:  patient reports  that he has been left calf pain for about two weeks.  Tenderness in calf.  Mild swelling noted.  He denies any travel, does not smoke. Will get ultrasound.    Dizziness:  he reports he has had dizziness comes and goes for a few weeks.  He says he usually notices that it happens when he gets up to fast from bending over.  He initially thought it was due to his new glasses.  He denies any headaches, chest pain, shortness of breath or abnormal bleeding. Patient orthostatics were normal. Neuro exam normal. Patient reports he does not drink very much water. Discussed increasing water intake and moving slower, to give his body a chance to catch up to the change. Will get labs.   Nasal polyps: patient reports he has trouble with nasal polyps for years. He would like to see a different ent.  Referral placed.   Relevant past medical, surgical, family and social history reviewed and updated as indicated. Interim medical history since our last visit reviewed. Allergies and medications reviewed and updated.  Review of Systems  Constitutional: Negative for fever or weight change.  Respiratory: Negative for cough and shortness of breath.   Cardiovascular: Negative for chest pain or palpitations.  Gastrointestinal: Negative for abdominal pain, no bowel changes.  Musculoskeletal: Negative for gait problem or joint swelling.  Skin: Negative for rash.  Neurological: positive for dizziness , negative headache.  No other specific complaints in a complete review of  systems (except as listed in HPI above).      Objective:    BP 128/76   Pulse 92   Temp 97.9 F (36.6 C) (Oral)   Resp 18   Ht 6\' 1"  (1.854 m)   Wt 169 lb 14.4 oz (77.1 kg)   SpO2 97%   BMI 22.42 kg/m   Wt Readings from Last 3 Encounters:  03/13/23 169 lb 14.4 oz (77.1 kg)  10/17/22 173 lb 3.2 oz (78.6 kg)  10/02/22 162 lb 8 oz (73.7 kg)    Physical Exam  Constitutional: Patient appears well-developed and well-nourished.  No distress.  HEENT: head atraumatic, normocephalic, pupils equal and reactive to light, ears Tms clear, neck supple, throat within normal limits Cardiovascular: Normal rate, regular rhythm and normal heart sounds.  No murmur heard. No BLE edema. Pulmonary/Chest: Effort normal and breath sounds normal. No respiratory distress. Abdominal: Soft.  There is no tenderness. Neuro: equal grip, no arm drift, no facial droop, negative romberg test Left lower leg: tenderness in calf Psychiatric: Patient has a normal mood and affect. behavior is normal. Judgment and thought content normal.  Results for orders placed or performed in visit on 10/17/22  Urine Culture   Specimen: Urine  Result Value Ref Range   MICRO NUMBER: [id number]    SPECIMEN QUALITY: Adequate    Sample Source  URINE    STATUS: FINAL    Result: No Growth   POCT urinalysis dipstick  Result Value Ref Range   Color, UA yellow    Clarity, UA clear    Glucose, UA Negative Negative   Bilirubin, UA negative    Ketones, UA negative    Spec Grav, UA 1.020 1.010 - 1.025   Blood, UA large    pH, UA 5.0 5.0 - 8.0   Protein, UA Positive (A) Negative   Urobilinogen, UA negative (A) 0.2 or 1.0 E.U./dL   Nitrite, UA negative    Leukocytes, UA Negative Negative   Appearance clear    Odor none       Assessment & Plan:   Problem List Items Addressed This Visit   None Visit Diagnoses     Pain of left calf    -  Primary   ultrasound to r/o dvt   Relevant Orders   US Venous Img Lower Unilateral Left  (DVT)   Dizziness       getting labs, orthostatics done, push fluids, change position slower   Relevant Orders   CBC with Differential/Platelet   COMPLETE METABOLIC PANEL WITH GFR   Screening for cholesterol level       Relevant Orders   Lipid panel   Screening for deficiency anemia       Relevant Orders   CBC with Differential/Platelet   Screening for diabetes mellitus       Relevant Orders   Hemoglobin A1c   Nasal polyps       referral placed to ent   Relevant Orders   Ambulatory referral to ENT        Follow up plan: Return if symptoms worsen or fail to improve.

## 2023-03-13 NOTE — Telephone Encounter (Signed)
Hannah from radiology called with stat results for pt. For venous lower left [place].  IMPRESSION: Negative.     Electronically Signed   By: Larose Hires D.O.   On: 03/13/2023 17:18

## 2023-03-14 ENCOUNTER — Other Ambulatory Visit: Payer: Self-pay | Admitting: Nurse Practitioner

## 2023-03-14 DIAGNOSIS — J4540 Moderate persistent asthma, uncomplicated: Secondary | ICD-10-CM

## 2023-03-14 LAB — LIPID PANEL
Cholesterol: 192 mg/dL (ref <200)
HDL: 67 mg/dL (ref >=40)
LDL Cholesterol (Calc): 109 mg/dL (calc) — ABNORMAL HIGH
Non-HDL Cholesterol (Calc): 125 mg/dL (calc) (ref <130)
Total CHOL/HDL Ratio: 2.9 (calc) (ref <5.0)
Triglycerides: 74 mg/dL (ref <150)

## 2023-03-14 LAB — COMPLETE METABOLIC PANEL WITH GFR
AG Ratio: 2.1 (calc) (ref 1.0–2.5)
ALT: 22 U/L (ref 9–46)
AST: 24 U/L (ref 10–35)
Albumin: 4.5 g/dL (ref 3.6–5.1)
Alkaline phosphatase (APISO): 124 U/L (ref 35–144)
BUN: 20 mg/dL (ref 7–25)
CO2: 27 mmol/L (ref 20–32)
Calcium: 9.4 mg/dL (ref 8.6–10.3)
Chloride: 105 mmol/L (ref 98–110)
Creat: 0.96 mg/dL (ref 0.70–1.30)
Globulin: 2.1 g/dL (calc) (ref 1.9–3.7)
Glucose, Bld: 91 mg/dL (ref 65–99)
Potassium: 4.2 mmol/L (ref 3.5–5.3)
Sodium: 139 mmol/L (ref 135–146)
Total Bilirubin: 0.4 mg/dL (ref 0.2–1.2)
Total Protein: 6.6 g/dL (ref 6.1–8.1)
eGFR: 93 mL/min/{1.73_m2} (ref >=60)

## 2023-03-14 LAB — CBC WITH DIFFERENTIAL/PLATELET
Absolute Monocytes: 440 cells/uL (ref 200–950)
Basophils Absolute: 30 cells/uL (ref 0–200)
Basophils Relative: 0.6 %
Eosinophils Absolute: 130 cells/uL (ref 15–500)
Eosinophils Relative: 2.6 %
HCT: 38.9 % (ref 38.5–50.0)
Hemoglobin: 13 g/dL — ABNORMAL LOW (ref 13.2–17.1)
Lymphs Abs: 1140 cells/uL (ref 850–3900)
MCH: 29.9 pg (ref 27.0–33.0)
MCHC: 33.4 g/dL (ref 32.0–36.0)
MCV: 89.4 fL (ref 80.0–100.0)
MPV: 9.7 fL (ref 7.5–12.5)
Monocytes Relative: 8.8 %
Neutro Abs: 3260 cells/uL (ref 1500–7800)
Neutrophils Relative %: 65.2 %
Platelets: 317 10*3/uL (ref 140–400)
RBC: 4.35 10*6/uL (ref 4.20–5.80)
RDW: 13.5 % (ref 11.0–15.0)
Total Lymphocyte: 22.8 %
WBC: 5 10*3/uL (ref 3.8–10.8)

## 2023-03-14 LAB — HEMOGLOBIN A1C
Hgb A1c MFr Bld: 5.7 % of total Hgb — ABNORMAL HIGH (ref <5.7)
Mean Plasma Glucose: 117 mg/dL
eAG (mmol/L): 6.5 mmol/L

## 2023-03-14 NOTE — Telephone Encounter (Signed)
FYI

## 2023-03-17 NOTE — Telephone Encounter (Signed)
Requested Prescriptions  Pending Prescriptions Disp Refills   montelukast (SINGULAIR) 10 MG tablet [Pharmacy Med Name: MONTELUKAST 10MG  TABLETS] 90 tablet 0    Sig: TAKE 1 TABLET(10 MG) BY MOUTH AT BEDTIME     Pulmonology:  Leukotriene Inhibitors Passed - 03/14/2023 11:53 PM      Passed - Valid encounter within last 12 months    Recent Outpatient Visits           4 days ago Pain of left calf   [facility] Della Goo F, FNP   5 months ago Acute bilateral low back pain without sciatica   [facility] Berniece Salines, FNP   5 months ago Viral upper respiratory tract infection   [facility] Della Goo F, FNP   1 year ago Moderate persistent chronic asthma without complication   [facility] Berniece Salines, FNP   1 year ago Nasal polyps   [facility] Berniece Salines, FNP       Future Appointments             In 1 week Zane Herald, Rudolpho Sevin, FNP [facility]

## 2023-03-25 NOTE — Progress Notes (Signed)
Name: Douglas Abbott.   MRN: [id number]    DOB: 02/14/1966   Date:03/26/2023       Progress Note  Subjective  Chief Complaint  Chief Complaint  Patient presents with   Annual Exam    HPI  Patient presents for annual CPE.  IPSS Questionnaire (AUA-7): elevated reading, will get psa and increase tamsulosin up to 0.8 mg daily.  If no improvement will refer to urology Over the past month.   1)  How often have you had a sensation of not emptying your bladder completely after you finish urinating?  3 - About half the time  2)  How often have you had to urinate again less than two hours after you finished urinating? 2 - Less than half the time  3)  How often have you found you stopped and started again several times when you urinated?  3 - About half the time  4) How difficult have you found it to postpone urination?  4 - More than half the time  5) How often have you had a weak urinary stream?  4 - More than half the time  6) How often have you had to push or strain to begin urination?  1 - Less than 1 time in 5  7) How many times did you most typically get up to urinate from the time you went to bed until the time you got up in the morning?  3 - 3 times  Total score:  0-7 mildly symptomatic   8-19 moderately symptomatic   20-35 severely symptomatic     Diet: he eats everything, he does not really watch his diet, but he is going to work on it.  Exercise: he walks [id number] steps a day, has a physical job Last Dental Exam: December 2023 Last Eye Exam: April 2024  Depression: phq 9 is negative    03/26/2023    7:59 AM 03/13/2023    3:13 PM 10/17/2022    3:31 PM 10/02/2022   10:29 AM 01/21/2022    8:12 AM  Depression screen PHQ 2/9  Decreased Interest 0 0 0 0 0  Down, Depressed, Hopeless 0 0 0 0 0  PHQ - 2 Score 0 0 0 0 0    Hypertension:  BP Readings from Last 3 Encounters:  03/26/23 126/82  03/13/23 128/76  10/17/22 128/76    Obesity: Wt Readings from Last 3 Encounters:   03/26/23 168 lb 4.8 oz (76.3 kg)  03/13/23 169 lb 14.4 oz (77.1 kg)  10/17/22 173 lb 3.2 oz (78.6 kg)   BMI Readings from Last 3 Encounters:  03/26/23 22.20 kg/m  03/13/23 22.42 kg/m  10/17/22 22.85 kg/m     Lipids:  Lab Results  Component Value Date   CHOL 192 03/13/2023   CHOL 221 (H) 01/21/2022   CHOL 198 07/23/2021   Lab Results  Component Value Date   HDL 67 03/13/2023   HDL 74 01/21/2022   HDL 69 07/23/2021   Lab Results  Component Value Date   LDLCALC 109 (H) 03/13/2023   LDLCALC 132 (H) 01/21/2022   LDLCALC 114 (H) 07/23/2021   Lab Results  Component Value Date   TRIG 74 03/13/2023   TRIG 55 01/21/2022   TRIG 68 07/23/2021   Lab Results  Component Value Date   CHOLHDL 2.9 03/13/2023   CHOLHDL 3.0 01/21/2022   CHOLHDL 2.9 07/23/2021   No results found for: "LDLDIRECT" Glucose:  Glucose, Bld  Date Value Ref Range  Status  03/13/2023 91 65 - 99 mg/dL Final    Comment:    .            Fasting reference interval .   01/21/2022 102 (H) 65 - 99 mg/dL Final    Comment:    .            Fasting reference interval . For someone without known diabetes, a glucose value between 100 and 125 mg/dL is consistent with prediabetes and should be confirmed with a follow-up test. .   07/23/2021 95 65 - 99 mg/dL Final    Comment:    .            Fasting reference interval .     Flowsheet Row Office Visit from 03/26/2023 in [facility]  AUDIT-C Score 0        Married STD testing and prevention (HIV/chl/gon/syphilis):  yes 11/25/2018 Sexual history: sexually active, he says he does have some erectile dysfunction, he says he has tried to get viagra before but it was too expensive,discussed getting it through [facility].  Hep C Screening: 07/23/2021 Skin cancer: Discussed monitoring for atypical lesions Colorectal cancer: due on 05/05/2023. Last Colonoscopy 05/04/2018 repeat every 5 years Prostate cancer:  yes Lab Results   Component Value Date   PSA 1.32 07/23/2021   PSA 1.3 11/25/2018   PSA 1.3 02/27/2017    Lung cancer:  Low Dose CT Chest recommended if Age 73-80 years, 30 pack-year currently smoking OR have quit w/in 15years. Patient  is  not a candidate for screening   AAA: The USPSTF recommends one-time screening with ultrasonography in men ages 15 to 75 years who have ever smoked. Patient   no, a candidate for screening  ECG:  08/26/2018  Vaccines:   HPV: aged out Tdap: up to date Shingrix: will check insurance Flu: up to date COVID-19:refused  Advanced Care Planning: A voluntary discussion about advance care planning including the explanation and discussion of advance directives.  Discussed health care proxy and Living will, and the patient was able to identify a health care proxy as Tonya.  Patient does not have a living will and power of attorney of health care   Patient Active Problem List   Diagnosis Date Noted   Lower urinary tract symptoms (LUTS) 03/26/2023   Food intolerance in adult    Urinary straining 07/23/2021   Hoarse voice quality 07/23/2021   Tinea pedis of left foot 03/09/2020   Synovitis and tenosynovitis 05/11/2019   Hemorrhoids 12/30/2018   Bradycardia 09/29/2018   Borderline high cholesterol 06/29/2018   Vitamin D deficiency 06/29/2018   Right shoulder pain 09/16/2017   GERD (gastroesophageal reflux disease) 01/02/2017   Blood in the urine 09/11/2015   Asthma, chronic 03/09/2015   Allergic rhinitis 03/09/2015    Past Surgical History:  Procedure Laterality Date   COLONOSCOPY WITH PROPOFOL N/A 05/04/2018   Procedure: COLONOSCOPY WITH PROPOFOL;  Surgeon: Wyline Mood, MD;  Location: [facility] ENDOSCOPY;  Service: Gastroenterology;  Laterality: N/A;   ESOPHAGOGASTRODUODENOSCOPY (EGD) WITH PROPOFOL N/A 04/10/2022   Procedure: ESOPHAGOGASTRODUODENOSCOPY (EGD) WITH PROPOFOL;  Surgeon: Toney Reil, MD;  Location: [facility] ENDOSCOPY;  Service: Gastroenterology;  Laterality:  N/A;   NO PAST SURGERIES      Family History  Problem Relation Age of Onset   Asthma Mother    Lung cancer Mother    Kidney disease Mother    Heart disease Neg Hx     Social History  Socioeconomic History   Marital status: Married    Spouse name: Archie Patten   Number of children: 3   Years of education: 12   Highest education level: High school graduate  Occupational History   Occupation: Haematologist: [employer]  Tobacco Use   Smoking status: Former    Packs/day: 1.00    Years: 8.00    Additional pack years: 0.00    Total pack years: 8.00    Types: Cigarettes    Quit date: 2000    Years since quitting: 24.4   Smokeless tobacco: Never  Vaping Use   Vaping Use: Never used  Substance and Sexual Activity   Alcohol use: Not Currently    Comment: occ   Drug use: Yes    Frequency: 1.0 times per week    Types: Marijuana    Comment: used yesterday   Sexual activity: Yes    Partners: Female  Other Topics Concern   Not on file  Social History Narrative   Not on file   Social Determinants of Health   Financial Resource Strain: Low Risk  (03/26/2023)   Overall Financial Resource Strain (CARDIA)    Difficulty of Paying Living Expenses: Not hard at all  Food Insecurity: No Food Insecurity (03/26/2023)   Hunger Vital Sign    Worried About Running Out of Food in the Last Year: Never true    Ran Out of Food in the Last Year: Never true  Transportation Needs: No Transportation Needs (03/26/2023)   PRAPARE - Administrator, Civil Service (Medical): No    Lack of Transportation (Non-Medical): No  Physical Activity: Sufficiently Active (03/26/2023)   Exercise Vital Sign    Days of Exercise per Week: 5 days    Minutes of Exercise per Session: 120 min  Stress: No Stress Concern Present (03/26/2023)   [employer] of Occupational Health - Occupational Stress Questionnaire    Feeling of Stress : Only a little  Social Connections: Socially Integrated (03/26/2023)    Social Connection and Isolation Panel [NHANES]    Frequency of Communication with Friends and Family: More than three times a week    Frequency of Social Gatherings with Friends and Family: More than three times a week    Attends Religious Services: More than 4 times per year    Active Member of [employer] or Organizations: Yes    Attends Engineer, structural: More than 4 times per year    Marital Status: Married  Catering manager Violence: Not At Risk (03/26/2023)   Humiliation, Afraid, Rape, and Kick questionnaire    Fear of Current or Ex-Partner: No    Emotionally Abused: No    Physically Abused: No    Sexually Abused: No     Current Outpatient Medications:    albuterol (VENTOLIN HFA) 108 (90 Base) MCG/ACT inhaler, Inhale 2 puffs into the lungs every 6 (six) hours as needed for wheezing or shortness of breath., Disp: 8 g, Rfl: 0   albuterol (VENTOLIN HFA) 108 (90 Base) MCG/ACT inhaler, INHALE 1 TO 2 PUFFS INTO THE LUNGS EVERY 6 HOURS AS NEEDED FOR WHEEZING OR SHORTNESS OF BREATH, Disp: 8 g, Rfl: 2   budesonide-formoterol (SYMBICORT) 160-4.5 MCG/ACT inhaler, Inhale 2 puffs into the lungs 2 (two) times daily., Disp: 30.6 g, Rfl: 3   fluticasone (FLONASE) 50 MCG/ACT nasal spray, Place 2 sprays into both nostrils daily., Disp: 16 g, Rfl: 6   meloxicam (MOBIC) 15 MG tablet, Take 15 mg by mouth  daily., Disp: , Rfl:    montelukast (SINGULAIR) 10 MG tablet, TAKE 1 TABLET(10 MG) BY MOUTH AT BEDTIME, Disp: 90 tablet, Rfl: 0   NASACORT ALLERGY 24HR 55 MCG/ACT AERO nasal inhaler, 2 sprays daily., Disp: , Rfl:    omeprazole (PRILOSEC) 20 MG capsule, TAKE 1 CAPSULE(20 MG) BY MOUTH TWICE DAILY, Disp: 180 capsule, Rfl: 0   sildenafil (VIAGRA) 100 MG tablet, Take 1 tablet (100 mg total) by mouth daily as needed for erectile dysfunction., Disp: 10 tablet, Rfl: 0   tamsulosin (FLOMAX) 0.4 MG CAPS capsule, Take 2 capsules (0.8 mg total) by mouth daily., Disp: 90 capsule, Rfl: 1  Allergies  Allergen  Reactions   Shellfish Allergy Swelling   Peanut-Containing Drug Products      ROS  Constitutional: Negative for fever or weight change.  Respiratory: Negative for cough and shortness of breath.   Cardiovascular: Negative for chest pain or palpitations.  Gastrointestinal: Negative for abdominal pain, no bowel changes.  Musculoskeletal: Negative for gait problem or joint swelling.  Skin: Negative for rash.  Neurological: Negative for dizziness or headache.  No other specific complaints in a complete review of systems (except as listed in HPI above).    Objective  Vitals:   03/26/23 0756  BP: 126/82  Pulse: 84  Resp: 18  Temp: 98.2 F (36.8 C)  TempSrc: Oral  SpO2: 97%  Weight: 168 lb 4.8 oz (76.3 kg)  Height: 6\' 1"  (1.854 m)    Body mass index is 22.2 kg/m.  Physical Exam Constitutional: Patient appears well-developed and well-nourished. No distress.  HENT: Head: Normocephalic and atraumatic. Ears: B TMs ok, no erythema or effusion; Nose: Nose normal. Mouth/Throat: Oropharynx is clear and moist. No oropharyngeal exudate.  Eyes: Conjunctivae and EOM are normal. Pupils are equal, round, and reactive to light. No scleral icterus.  Neck: Normal range of motion. Neck supple. No JVD present. No thyromegaly present.  Cardiovascular: Normal rate, regular rhythm and normal heart sounds.  No murmur heard. No BLE edema. Pulmonary/Chest: Effort normal and breath sounds normal. No respiratory distress. Abdominal: Soft. Bowel sounds are normal, no distension. There is no tenderness. no masses Musculoskeletal: Normal range of motion, no joint effusions. No gross deformities Neurological: he is alert and oriented to person, place, and time. No cranial nerve deficit. Coordination, balance, strength, speech and gait are normal.  Skin: Skin is warm and dry. No rash noted. No erythema.  Psychiatric: Patient has a normal mood and affect. behavior is normal. Judgment and thought content  normal.   Recent Results (from the past 2160 hour(s))  CBC with Differential/Platelet     Status: Abnormal   Collection Time: 03/13/23  3:42 PM  Result Value Ref Range   WBC 5.0 3.8 - 10.8 Thousand/uL   RBC 4.35 4.20 - 5.80 Million/uL   Hemoglobin 13.0 (L) 13.2 - 17.1 g/dL   HCT 16.1 09.6 - 04.5 %   MCV 89.4 80.0 - 100.0 fL   MCH 29.9 27.0 - 33.0 pg   MCHC 33.4 32.0 - 36.0 g/dL   RDW 40.9 81.1 - 91.4 %   Platelets 317 140 - 400 Thousand/uL   MPV 9.7 7.5 - 12.5 fL   Neutro Abs 3,260 1,500 - 7,800 cells/uL   Lymphs Abs 1,140 850 - 3,900 cells/uL   Absolute Monocytes 440 200 - 950 cells/uL   Eosinophils Absolute 130 15 - 500 cells/uL   Basophils Absolute 30 0 - 200 cells/uL   Neutrophils Relative % 65.2 %  Total Lymphocyte 22.8 %   Monocytes Relative 8.8 %   Eosinophils Relative 2.6 %   Basophils Relative 0.6 %  COMPLETE METABOLIC PANEL WITH GFR     Status: None   Collection Time: 03/13/23  3:42 PM  Result Value Ref Range   Glucose, Bld 91 65 - 99 mg/dL    Comment: .            Fasting reference interval .    BUN 20 7 - 25 mg/dL   Creat 4.09 8.11 - 9.14 mg/dL   eGFR 93 > OR = 60 NW/GNF/6.21H0   BUN/Creatinine Ratio SEE NOTE: 6 - 22 (calc)    Comment:    Not Reported: BUN and Creatinine are within    reference range. .    Sodium 139 135 - 146 mmol/L   Potassium 4.2 3.5 - 5.3 mmol/L   Chloride 105 98 - 110 mmol/L   CO2 27 20 - 32 mmol/L   Calcium 9.4 8.6 - 10.3 mg/dL   Total Protein 6.6 6.1 - 8.1 g/dL   Albumin 4.5 3.6 - 5.1 g/dL   Globulin 2.1 1.9 - 3.7 g/dL (calc)   AG Ratio 2.1 1.0 - 2.5 (calc)   Total Bilirubin 0.4 0.2 - 1.2 mg/dL   Alkaline phosphatase (APISO) 124 35 - 144 U/L   AST 24 10 - 35 U/L   ALT 22 9 - 46 U/L  Lipid panel     Status: Abnormal   Collection Time: 03/13/23  3:42 PM  Result Value Ref Range   Cholesterol 192 <200 mg/dL   HDL 67 > OR = 40 mg/dL   Triglycerides 74 <865 mg/dL   LDL Cholesterol (Calc) 109 (H) mg/dL (calc)    Comment:  Reference range: <100 . Desirable range <100 mg/dL for primary prevention;   <70 mg/dL for patients with CHD or diabetic patients  with > or = 2 CHD risk factors. Marland Kitchen LDL-C is now calculated using the Martin-Hopkins  calculation, which is a validated novel method providing  better accuracy than the Friedewald equation in the  estimation of LDL-C.  Horald Pollen et al. Lenox Ahr. 7846;962(95): 2061-2068  (http://education.[facility].com/faq/FAQ164)    Total CHOL/HDL Ratio 2.9 <5.0 (calc)   Non-HDL Cholesterol (Calc) 125 <130 mg/dL (calc)    Comment: For patients with diabetes plus 1 major ASCVD risk  factor, treating to a non-HDL-C goal of <100 mg/dL  (LDL-C of <28 mg/dL) is considered a therapeutic  option.   Hemoglobin A1c     Status: Abnormal   Collection Time: 03/13/23  3:42 PM  Result Value Ref Range   Hgb A1c MFr Bld 5.7 (H) <5.7 % of total Hgb    Comment: For someone without known diabetes, a hemoglobin  A1c value between 5.7% and 6.4% is consistent with prediabetes and should be confirmed with a  follow-up test. . For someone with known diabetes, a value <7% indicates that their diabetes is well controlled. A1c targets should be individualized based on duration of diabetes, age, comorbid conditions, and other considerations. . This assay result is consistent with an increased risk of diabetes. . Currently, no consensus exists regarding use of hemoglobin A1c for diagnosis of diabetes for children. .    Mean Plasma Glucose 117 mg/dL   eAG (mmol/L) 6.5 mmol/L    Comment: . This test was performed on the Roche cobas c503 platform. Effective 07/22/22, a change in test platforms from the Abbott Architect to the Roche cobas c503 may have shifted HbA1c  results compared to historical results. Based on laboratory validation testing conducted at [facility], the Roche platform relative to the Abbott platform had an average increase in HbA1c value of < or = 0.3%. This difference is  within accepted  variability established by the [facility]. Note that not all individuals will have had a shift in their results and direct comparisons between historical and current results for testing conducted on different platforms is not recommended.      Fall Risk:    03/26/2023    7:58 AM 03/13/2023    3:12 PM 10/17/2022    3:31 PM 10/02/2022   10:28 AM 01/21/2022    8:11 AM  Fall Risk   Falls in the past year? 0 0 0 0 0  Number falls in past yr: 0 0 0 0 0  Injury with Fall? 0 0 0 0 0  Follow up   Falls evaluation completed Falls evaluation completed Falls evaluation completed     Functional Status Survey: Is the patient deaf or have difficulty hearing?: No Does the patient have difficulty seeing, even when wearing glasses/contacts?: No Does the patient have difficulty concentrating, remembering, or making decisions?: No Does the patient have difficulty walking or climbing stairs?: No Does the patient have difficulty dressing or bathing?: No Does the patient have difficulty doing errands alone such as visiting a doctor's office or shopping?: No    Assessment & Plan  1. Annual physical exam Labs recently done, will get psa Recommend working on diet  2. Colon cancer screening  - Ambulatory referral to Gastroenterology  3. Lower urinary tract symptoms (LUTS)  - PSA - tamsulosin (FLOMAX) 0.4 MG CAPS capsule; Take 2 capsules (0.8 mg total) by mouth daily.  Dispense: 90 capsule; Refill: 1  4. Prostate cancer screening  - PSA  5. Erectile dysfunction, unspecified erectile dysfunction type  - sildenafil (VIAGRA) 100 MG tablet; Take 1 tablet (100 mg total) by mouth daily as needed for erectile dysfunction.  Dispense: 10 tablet; Refill: 0    -Prostate cancer screening and PSA options (with potential risks and benefits of testing vs not testing) were discussed along with recent recs/guidelines. -USPSTF grade A and B  recommendations reviewed with patient; age-appropriate recommendations, preventive care, screening tests, etc discussed and encouraged; healthy living encouraged; see AVS for patient education given to patient -Discussed importance of 150 minutes of physical activity weekly, eat two servings of fish weekly, eat one serving of tree nuts ( cashews, pistachios, pecans, almonds.Marland Kitchen) every other day, eat 6 servings of fruit/vegetables daily and drink plenty of water and avoid sweet beverages.  -Reviewed Health Maintenance: yes

## 2023-03-26 ENCOUNTER — Other Ambulatory Visit: Payer: Self-pay

## 2023-03-26 ENCOUNTER — Telehealth: Payer: Self-pay

## 2023-03-26 ENCOUNTER — Encounter: Payer: Self-pay | Admitting: Nurse Practitioner

## 2023-03-26 ENCOUNTER — Ambulatory Visit (INDEPENDENT_AMBULATORY_CARE_PROVIDER_SITE_OTHER): Payer: 59 | Admitting: Nurse Practitioner

## 2023-03-26 VITALS — BP 126/82 | HR 84 | Temp 98.2°F | Resp 18 | Ht 73.0 in | Wt 168.3 lb

## 2023-03-26 DIAGNOSIS — Z0000 Encounter for general adult medical examination without abnormal findings: Secondary | ICD-10-CM

## 2023-03-26 DIAGNOSIS — N529 Male erectile dysfunction, unspecified: Secondary | ICD-10-CM

## 2023-03-26 DIAGNOSIS — Z1211 Encounter for screening for malignant neoplasm of colon: Secondary | ICD-10-CM

## 2023-03-26 DIAGNOSIS — Z86010 Personal history of colonic polyps: Secondary | ICD-10-CM

## 2023-03-26 DIAGNOSIS — R399 Unspecified symptoms and signs involving the genitourinary system: Secondary | ICD-10-CM | POA: Diagnosis not present

## 2023-03-26 DIAGNOSIS — Z125 Encounter for screening for malignant neoplasm of prostate: Secondary | ICD-10-CM

## 2023-03-26 MED ORDER — NA SULFATE-K SULFATE-MG SULF 17.5-3.13-1.6 GM/177ML PO SOLN: 1.0000 | Freq: Once | ORAL | 0 refills | Status: AC

## 2023-03-26 MED ORDER — SILDENAFIL CITRATE 100 MG PO TABS: 100.0000 mg | ORAL_TABLET | Freq: Every day | ORAL | 0 refills | Status: DC | PRN

## 2023-03-26 MED ORDER — TAMSULOSIN HCL 0.4 MG PO CAPS: 0.8000 mg | ORAL_CAPSULE | Freq: Every day | ORAL | 1 refills | Status: DC

## 2023-03-26 NOTE — Telephone Encounter (Signed)
Gastroenterology Pre-Procedure Review  Request Date: 05/21/23 Requesting Physician: Dr. Allegra Lai  PATIENT REVIEW QUESTIONS: The patient responded to the following health history questions as indicated:    1. Are you having any GI issues? no 2. Do you have a personal history of Polyps? yes (05/04/18 performed by Dr. Tobi Bastos precancerous polyp was present) 3. Do you have a family history of Colon Cancer or Polyps?  Mother colon cancer 4. Diabetes Mellitus? no 5. Joint replacements in the past 12 months?no 6. Major health problems in the past 3 months?no 7. Any artificial heart valves, MVP, or defibrillator?no    MEDICATIONS & ALLERGIES:    Patient reports the following regarding taking any anticoagulation/antiplatelet therapy:   Plavix, Coumadin, Eliquis, Xarelto, Lovenox, Pradaxa, Brilinta, or Effient? no Aspirin? no  Patient confirms/reports the following medications:  Current Outpatient Medications  Medication Sig Dispense Refill   albuterol (VENTOLIN HFA) 108 (90 Base) MCG/ACT inhaler Inhale 2 puffs into the lungs every 6 (six) hours as needed for wheezing or shortness of breath. 8 g 0   albuterol (VENTOLIN HFA) 108 (90 Base) MCG/ACT inhaler INHALE 1 TO 2 PUFFS INTO THE LUNGS EVERY 6 HOURS AS NEEDED FOR WHEEZING OR SHORTNESS OF BREATH 8 g 2   budesonide-formoterol (SYMBICORT) 160-4.5 MCG/ACT inhaler Inhale 2 puffs into the lungs 2 (two) times daily. 30.6 g 3   fluticasone (FLONASE) 50 MCG/ACT nasal spray Place 2 sprays into both nostrils daily. 16 g 6   meloxicam (MOBIC) 15 MG tablet Take 15 mg by mouth daily.     montelukast (SINGULAIR) 10 MG tablet TAKE 1 TABLET(10 MG) BY MOUTH AT BEDTIME 90 tablet 0   NASACORT ALLERGY 24HR 55 MCG/ACT AERO nasal inhaler 2 sprays daily.     omeprazole (PRILOSEC) 20 MG capsule TAKE 1 CAPSULE(20 MG) BY MOUTH TWICE DAILY 180 capsule 0   sildenafil (VIAGRA) 100 MG tablet Take 1 tablet (100 mg total) by mouth daily as needed for erectile dysfunction. 10 tablet  0   tamsulosin (FLOMAX) 0.4 MG CAPS capsule Take 2 capsules (0.8 mg total) by mouth daily. 90 capsule 1   No current facility-administered medications for this visit.    Patient confirms/reports the following allergies:  Allergies  Allergen Reactions   Shellfish Allergy Swelling   Peanut-Containing Drug Products     No orders of the defined types were placed in this encounter.   AUTHORIZATION INFORMATION Primary Insurance: 1D#: Group #:  Secondary Insurance: 1D#: Group #:  SCHEDULE INFORMATION: Date: 05/21/23 Time: Location: [facility]

## 2023-03-27 LAB — PSA: PSA: 1.97 ng/mL (ref <=4.00)

## 2023-05-06 ENCOUNTER — Other Ambulatory Visit: Payer: Self-pay | Admitting: Nurse Practitioner

## 2023-05-06 DIAGNOSIS — K219 Gastro-esophageal reflux disease without esophagitis: Secondary | ICD-10-CM

## 2023-05-06 DIAGNOSIS — N529 Male erectile dysfunction, unspecified: Secondary | ICD-10-CM

## 2023-05-07 MED ORDER — SILDENAFIL CITRATE 100 MG PO TABS: 100.0000 mg | ORAL_TABLET | Freq: Every day | ORAL | 0 refills | Status: DC | PRN

## 2023-05-07 MED ORDER — OMEPRAZOLE 20 MG PO CPDR: 20.0000 mg | DELAYED_RELEASE_CAPSULE | Freq: Every day | ORAL | 0 refills | Status: DC

## 2023-05-21 ENCOUNTER — Ambulatory Visit: Payer: 59 | Admitting: Anesthesiology

## 2023-05-21 ENCOUNTER — Encounter: Payer: Self-pay | Admitting: Gastroenterology

## 2023-05-21 ENCOUNTER — Ambulatory Visit
Admission: RE | Admit: 2023-05-21 | Discharge: 2023-05-21 | Disposition: A | Discharge status: Home / Self Care | Payer: 59 | Attending: Gastroenterology | Admitting: Gastroenterology

## 2023-05-21 ENCOUNTER — Other Ambulatory Visit: Payer: Self-pay

## 2023-05-21 ENCOUNTER — Encounter
Admission: RE | Disposition: A | Discharge status: Home / Self Care | Payer: Self-pay | Source: Home / Self Care | Attending: Gastroenterology

## 2023-05-21 DIAGNOSIS — Z1211 Encounter for screening for malignant neoplasm of colon: Secondary | ICD-10-CM | POA: Diagnosis present

## 2023-05-21 DIAGNOSIS — Z860100 Personal history of colon polyps, unspecified: Secondary | ICD-10-CM

## 2023-05-21 DIAGNOSIS — Z09 Encounter for follow-up examination after completed treatment for conditions other than malignant neoplasm: Secondary | ICD-10-CM | POA: Insufficient documentation

## 2023-05-21 DIAGNOSIS — Z87891 Personal history of nicotine dependence: Secondary | ICD-10-CM | POA: Insufficient documentation

## 2023-05-21 HISTORY — PX: COLONOSCOPY WITH PROPOFOL: SHX5780

## 2023-05-21 SURGERY — COLONOSCOPY WITH PROPOFOL
Anesthesia: General

## 2023-05-21 MED ORDER — GLYCOPYRROLATE 0.2 MG/ML IJ SOLN
INTRAMUSCULAR | Status: AC
  Filled 2023-05-21: qty 1

## 2023-05-21 MED ORDER — PROPOFOL 500 MG/50ML IV EMUL
INTRAVENOUS | Status: DC | PRN
  Administered 2023-05-21: 140 ug/kg/min via INTRAVENOUS

## 2023-05-21 MED ORDER — LIDOCAINE HCL (CARDIAC) PF 100 MG/5ML IV SOSY
PREFILLED_SYRINGE | INTRAVENOUS | Status: DC | PRN
  Administered 2023-05-21: 20 mg via INTRAVENOUS

## 2023-05-21 MED ORDER — LIDOCAINE HCL (PF) 2 % IJ SOLN
INTRAMUSCULAR | Status: AC
  Filled 2023-05-21: qty 5

## 2023-05-21 MED ORDER — GLYCOPYRROLATE 0.2 MG/ML IJ SOLN
INTRAMUSCULAR | Status: DC | PRN
  Administered 2023-05-21: .2 mg via INTRAVENOUS

## 2023-05-21 MED ORDER — PROPOFOL 10 MG/ML IV BOLUS
INTRAVENOUS | Status: DC | PRN
Start: 2023-05-21 — End: 2023-05-21
  Administered 2023-05-21: 100 mg via INTRAVENOUS

## 2023-05-21 MED ORDER — STERILE WATER FOR IRRIGATION IR SOLN
Status: DC | PRN
  Administered 2023-05-21: 120 mL

## 2023-05-21 MED ORDER — PROPOFOL 1000 MG/100ML IV EMUL
INTRAVENOUS | Status: AC
  Filled 2023-05-21: qty 100

## 2023-05-21 MED ORDER — SODIUM CHLORIDE 0.9 % IV SOLN: INTRAVENOUS | Status: DC

## 2023-05-21 NOTE — Transfer of Care (Signed)
Immediate Anesthesia Transfer of Care Note  Patient: Douglas Abbott.  Procedure(s) Performed: COLONOSCOPY WITH PROPOFOL  Patient Location: PACU  Anesthesia Type:General  Level of Consciousness: drowsy  Airway & Oxygen Therapy: Patient Spontanous Breathing  Post-op Assessment: Report given to RN and Post -op Vital signs reviewed and stable  Post vital signs: Reviewed and stable  Last Vitals:  Vitals Value Taken Time  BP 132/92 05/21/23 0904  Temp 97.5 05/21/23 0904  Pulse 56 05/21/23 0904  Resp 25 05/21/23 0904  SpO2 100 % 05/21/23 0904  Vitals shown include unfiled device data.  Last Pain:  Vitals:   05/21/23 0811  TempSrc: Temporal  PainSc: 0-No pain         Complications: No notable events documented.

## 2023-05-21 NOTE — Anesthesia Postprocedure Evaluation (Signed)
Anesthesia Post Note  Patient: Douglas Abbott.  Procedure(s) Performed: COLONOSCOPY WITH PROPOFOL  Patient location during evaluation: Endoscopy Anesthesia Type: General Level of consciousness: awake and alert Pain management: pain level controlled Vital Signs Assessment: post-procedure vital signs reviewed and stable Respiratory status: spontaneous breathing, nonlabored ventilation, respiratory function stable and patient connected to nasal cannula oxygen Cardiovascular status: blood pressure returned to baseline and stable Postop Assessment: no apparent nausea or vomiting Anesthetic complications: no   No notable events documented.   Last Vitals:  Vitals:   05/21/23 0811 05/21/23 0904  BP: 128/79 (!) 132/92  Pulse: (!) 55 (!) 58  Resp: 16 (!) 25  Temp: (!) 36.3 C (!) 36.4 C  SpO2:  100%    Last Pain:  Vitals:   05/21/23 0924  TempSrc:   PainSc: 0-No pain                 Corinda Gubler

## 2023-05-21 NOTE — Op Note (Signed)
[facility] Gastroenterology Patient Name: Douglas Abbott Procedure Date: 05/21/2023 7:38 AM MRN: [id number] Account #: [id number] Date of Birth: 1965/11/20 Admit Type: Outpatient Age: 57 Room: [facility] ENDO ROOM 4 Gender: Male Note Status: Finalized Instrument Name: Nelda Marseille [id number] Procedure:             Colonoscopy Indications:           Surveillance: Personal history of adenomatous polyps                         on last colonoscopy 5 years ago, Last colonoscopy:                         July 2019 Providers:             Toney Reil MD, MD Referring MD:          Rudolpho Sevin. Zane Herald (Referring MD) Medicines:             General Anesthesia Complications:         No immediate complications. Estimated blood loss: None. Procedure:             Pre-Anesthesia Assessment:                        - Prior to the procedure, a History and Physical was                         performed, and patient medications and allergies were                         reviewed. The patient is competent. The risks and                         benefits of the procedure and the sedation options and                         risks were discussed with the patient. All questions                         were answered and informed consent was obtained.                         Patient identification and proposed procedure were                         verified by the physician, the nurse, the                         anesthesiologist, the anesthetist and the technician                         in the pre-procedure area in the procedure room in the                         endoscopy suite. Mental Status Examination: alert and                         oriented. Airway Examination: normal oropharyngeal  airway and neck mobility. Respiratory Examination:                         clear to auscultation. CV Examination: normal.                         Prophylactic Antibiotics: The patient does  not require                         prophylactic antibiotics. Prior Anticoagulants: The                         patient has taken no anticoagulant or antiplatelet                         agents. ASA Grade Assessment: II - A patient with mild                         systemic disease. After reviewing the risks and                         benefits, the patient was deemed in satisfactory                         condition to undergo the procedure. The anesthesia                         plan was to use general anesthesia. Immediately prior                         to administration of medications, the patient was                         re-assessed for adequacy to receive sedatives. The                         heart rate, respiratory rate, oxygen saturations,                         blood pressure, adequacy of pulmonary ventilation, and                         response to care were monitored throughout the                         procedure. The physical status of the patient was                         re-assessed after the procedure.                        After obtaining informed consent, the colonoscope was                         passed under direct vision. Throughout the procedure,                         the patient's blood pressure, pulse, and oxygen  saturations were monitored continuously. The                         Colonoscope was introduced through the anus and                         advanced to the the cecum, identified by appendiceal                         orifice and ileocecal valve. The colonoscopy was                         performed without difficulty. The patient tolerated                         the procedure well. The quality of the bowel                         preparation was evaluated using the BBPS [facility] Bowel                         Preparation Scale) with scores of: Right Colon = 3,                         Transverse Colon = 3 and Left Colon =  3 (entire mucosa                         seen well with no residual staining, small fragments                         of stool or opaque liquid). The total BBPS score                         equals 9. The ileocecal valve, appendiceal orifice,                         and rectum were photographed. Findings:      The perianal and digital rectal examinations were normal. Pertinent       negatives include normal sphincter tone and no palpable rectal lesions.      The entire examined colon appeared normal.      The retroflexed view of the distal rectum and anal verge was normal and       showed no anal or rectal abnormalities. Impression:            - The entire examined colon is normal.                        - The distal rectum and anal verge are normal on                         retroflexion view.                        - No specimens collected. Recommendation:        - Discharge patient to home (with escort).                        -  Resume previous diet today.                        - Continue present medications.                        - Repeat colonoscopy in 10 years for screening                         purposes. Procedure Code(s):     --- Professional ---                        Z6109, Colorectal cancer screening; colonoscopy on                         individual at high risk Diagnosis Code(s):     --- Professional ---                        Z86.010, Personal history of colonic polyps CPT copyright 2022 American Medical Association. All rights reserved. The codes documented in this report are preliminary and upon coder review may  be revised to meet current compliance requirements. Dr. Libby Maw Toney Reil MD, MD 05/21/2023 9:03:57 AM This report has been signed electronically. Number of Addenda: 0 Note Initiated On: 05/21/2023 7:38 AM Scope Withdrawal Time: 0 hours 7 minutes 50 seconds  Total Procedure Duration: 0 hours 10 minutes 13 seconds  Estimated Blood Loss:   Estimated blood loss: none.      [facility]

## 2023-05-21 NOTE — H&P (Signed)
Arlyss Repress, MD [address]  [address]  [place], [place] [address]  Main: [phone]  Fax: [phone] Pager: [phone]  Primary Care Physician:  Berniece Salines, FNP Primary Gastroenterologist:  Dr. Arlyss Repress  Pre-Procedure History & Physical: HPI:  Douglas Weldin. is a 57 y.o. male is here for an colonoscopy.   Past Medical History:  Diagnosis Date   Allergic rhinitis    Allergy    Asthma    BRBPR (bright red blood per rectum) 03/27/2018   Decreased libido    Dysrhythmia    History of pleurisy    Sinus bradycardia    a. 03/2017 Echo: Ef 50-55%.    Past Surgical History:  Procedure Laterality Date   COLONOSCOPY WITH PROPOFOL N/A 05/04/2018   Procedure: COLONOSCOPY WITH PROPOFOL;  Surgeon: Wyline Mood, MD;  Location: [facility] ENDOSCOPY;  Service: Gastroenterology;  Laterality: N/A;   ESOPHAGOGASTRODUODENOSCOPY (EGD) WITH PROPOFOL N/A 04/10/2022   Procedure: ESOPHAGOGASTRODUODENOSCOPY (EGD) WITH PROPOFOL;  Surgeon: Toney Reil, MD;  Location: [facility] ENDOSCOPY;  Service: Gastroenterology;  Laterality: N/A;   NO PAST SURGERIES      Prior to Admission medications   Medication Sig Start Date End Date Taking? Authorizing Provider  budesonide-formoterol (SYMBICORT) 160-4.5 MCG/ACT inhaler Inhale 2 puffs into the lungs 2 (two) times daily. 01/21/22  Yes Berniece Salines, FNP  meloxicam (MOBIC) 15 MG tablet Take 15 mg by mouth daily. 06/01/21  Yes [provider]  montelukast (SINGULAIR) 10 MG tablet TAKE 1 TABLET(10 MG) BY MOUTH AT BEDTIME 03/17/23  Yes Berniece Salines, FNP  omeprazole (PRILOSEC) 20 MG capsule Take 1 capsule (20 mg total) by mouth daily. 05/07/23  Yes Berniece Salines, FNP  tamsulosin (FLOMAX) 0.4 MG CAPS capsule Take 2 capsules (0.8 mg total) by mouth daily. 03/26/23  Yes Berniece Salines, FNP  albuterol (VENTOLIN HFA) 108 (90 Base) MCG/ACT inhaler Inhale 2 puffs into the lungs every 6 (six) hours as needed for wheezing or shortness of  breath. 10/01/22   Viviano Simas, FNP  albuterol (VENTOLIN HFA) 108 (90 Base) MCG/ACT inhaler INHALE 1 TO 2 PUFFS INTO THE LUNGS EVERY 6 HOURS AS NEEDED FOR WHEEZING OR SHORTNESS OF BREATH 02/18/23   Berniece Salines, FNP  fluticasone (FLONASE) 50 MCG/ACT nasal spray Place 2 sprays into both nostrils daily. 11/02/21   Berniece Salines, FNP  NASACORT ALLERGY 24HR 55 MCG/ACT AERO nasal inhaler 2 sprays daily. 03/28/22   [provider]  sildenafil (VIAGRA) 100 MG tablet Take 1 tablet (100 mg total) by mouth daily as needed for erectile dysfunction. 05/07/23   Berniece Salines, FNP    Allergies as of 03/26/2023 - Review Complete 03/26/2023  Allergen Reaction Noted   Shellfish allergy Swelling 03/04/2017   Peanut-containing drug products  09/24/2018    Family History  Problem Relation Age of Onset   Asthma Mother    Lung cancer Mother    Kidney disease Mother    Heart disease Neg Hx     Social History   Socioeconomic History   Marital status: Married    Spouse name: Archie Patten   Number of children: 3   Years of education: 12   Highest education level: High school graduate  Occupational History   Occupation: Haematologist: [employer]  Tobacco Use   Smoking status: Former    Current packs/day: 0.00    Average packs/day: 1 pack/day for 8.0 years (8.0 ttl pk-yrs)    Types: Cigarettes  Start date: 62    Quit date: 2000    Years since quitting: 24.6   Smokeless tobacco: Never  Vaping Use   Vaping status: Never Used  Substance and Sexual Activity   Alcohol use: Not Currently    Comment: occ   Drug use: Yes    Frequency: 1.0 times per week    Types: Marijuana    Comment: used yesterday   Sexual activity: Yes    Partners: Female  Other Topics Concern   Not on file  Social History Narrative   Not on file   Social Determinants of Health   Financial Resource Strain: Low Risk  (03/26/2023)   Overall Financial Resource Strain (CARDIA)    Difficulty of Paying Living  Expenses: Not hard at all  Food Insecurity: No Food Insecurity (03/26/2023)   Hunger Vital Sign    Worried About Running Out of Food in the Last Year: Never true    Ran Out of Food in the Last Year: Never true  Transportation Needs: No Transportation Needs (03/26/2023)   PRAPARE - Administrator, Civil Service (Medical): No    Lack of Transportation (Non-Medical): No  Physical Activity: Sufficiently Active (03/26/2023)   Exercise Vital Sign    Days of Exercise per Week: 5 days    Minutes of Exercise per Session: 120 min  Stress: No Stress Concern Present (03/26/2023)   [employer] of Occupational Health - Occupational Stress Questionnaire    Feeling of Stress : Only a little  Social Connections: Socially Integrated (03/26/2023)   Social Connection and Isolation Panel [NHANES]    Frequency of Communication with Friends and Family: More than three times a week    Frequency of Social Gatherings with Friends and Family: More than three times a week    Attends Religious Services: More than 4 times per year    Active Member of [employer] or Organizations: Yes    Attends Engineer, structural: More than 4 times per year    Marital Status: Married  Catering manager Violence: Not At Risk (03/26/2023)   Humiliation, Afraid, Rape, and Kick questionnaire    Fear of Current or Ex-Partner: No    Emotionally Abused: No    Physically Abused: No    Sexually Abused: No    Review of Systems: See HPI, otherwise negative ROS  Physical Exam: BP 128/79   Pulse (!) 55   Temp (!) 97.4 F (36.3 C) (Temporal)   Resp 16   Ht 6\' 2"  (1.88 m)   Wt 73.5 kg   BMI 20.80 kg/m  General:   Alert,  pleasant and cooperative in NAD Head:  Normocephalic and atraumatic. Neck:  Supple; no masses or thyromegaly. Lungs:  Clear throughout to auscultation.    Heart:  Regular rate and rhythm. Abdomen:  Soft, nontender and nondistended. Normal bowel sounds, without guarding, and without rebound.    Neurologic:  Alert and  oriented x4;  grossly normal neurologically.  Impression/Plan: Douglas Rued. is here for an colonoscopy to be performed for h/o colon adenomas  Risks, benefits, limitations, and alternatives regarding  colonoscopy have been reviewed with the patient.  Questions have been answered.  All parties agreeable.   Lannette Donath, MD  05/21/2023, 8:42 AM

## 2023-05-21 NOTE — Anesthesia Preprocedure Evaluation (Signed)
Anesthesia Evaluation  Patient identified by MRN, date of birth, ID band Patient awake    Reviewed: Allergy & Precautions, NPO status , Patient's Chart, lab work & pertinent test results  History of Anesthesia Complications Negative for: history of anesthetic complications  Airway Mallampati: II  TM Distance: >3 FB Neck ROM: Full    Dental  (+) Loose, Chipped,    Pulmonary asthma , neg sleep apnea, neg COPD, Patient abstained from smoking.Not current smoker, former smoker Takes symbicort bid, rescue inhaler  3 times per week. Never hospitalized for asthma, breathing feels good today   Pulmonary exam normal breath sounds clear to auscultation       Cardiovascular Exercise Tolerance: Good METS(-) hypertension(-) CAD and (-) Past MI negative cardio ROS (-) dysrhythmias  Rhythm:Regular Rate:Normal - Systolic murmurs    Neuro/Psych negative neurological ROS  negative psych ROS   GI/Hepatic ,GERD  Medicated and Controlled,,(+)     (-) substance abuse    Endo/Other  neg diabetes    Renal/GU negative Renal ROS     Musculoskeletal   Abdominal   Peds  Hematology   Anesthesia Other Findings Past Medical History: No date: Allergic rhinitis No date: Allergy No date: Asthma 03/27/2018: BRBPR (bright red blood per rectum) No date: Decreased libido No date: Dysrhythmia No date: History of pleurisy No date: Sinus bradycardia     Comment:  a. 03/2017 Echo: Ef 50-55%.  Reproductive/Obstetrics                             Anesthesia Physical Anesthesia Plan  ASA: 2  Anesthesia Plan: General   Post-op Pain Management: Minimal or no pain anticipated   Induction: Intravenous  PONV Risk Score and Plan: 2 and Propofol infusion, TIVA and Ondansetron  Airway Management Planned: Nasal Cannula  Additional Equipment: None  Intra-op Plan:   Post-operative Plan:   Informed Consent: I have reviewed  the patients History and Physical, chart, labs and discussed the procedure including the risks, benefits and alternatives for the proposed anesthesia with the patient or authorized representative who has indicated his/her understanding and acceptance.     Dental advisory given  Plan Discussed with: CRNA and Surgeon  Anesthesia Plan Comments: (Discussed risks of anesthesia with patient, including possibility of difficulty with spontaneous ventilation under anesthesia necessitating airway intervention, PONV, and rare risks such as cardiac or respiratory or neurological events, and allergic reactions. Discussed the role of CRNA in patient's perioperative care. Patient understands.)       Anesthesia Quick Evaluation

## 2023-05-22 ENCOUNTER — Encounter: Payer: Self-pay | Admitting: Gastroenterology

## 2023-05-23 ENCOUNTER — Ambulatory Visit (INDEPENDENT_AMBULATORY_CARE_PROVIDER_SITE_OTHER): Payer: 59 | Admitting: Family Medicine

## 2023-05-23 ENCOUNTER — Ambulatory Visit: Payer: Self-pay

## 2023-05-23 ENCOUNTER — Encounter: Payer: Self-pay | Admitting: Family Medicine

## 2023-05-23 VITALS — BP 118/72 | HR 88 | Temp 98.3°F | Resp 16 | Ht 73.0 in | Wt 164.4 lb

## 2023-05-23 DIAGNOSIS — M5442 Lumbago with sciatica, left side: Secondary | ICD-10-CM | POA: Diagnosis not present

## 2023-05-23 MED ORDER — PREDNISONE 20 MG PO TABS
ORAL_TABLET | ORAL | 0 refills | Status: DC
Start: 2023-05-24 — End: 2023-07-21

## 2023-05-23 MED ORDER — DEXAMETHASONE SODIUM PHOSPHATE 10 MG/ML IJ SOLN
10.0000 mg | Freq: Once | INTRAMUSCULAR | Status: AC
Start: 2023-05-23 — End: 2023-05-23
  Administered 2023-05-23: 10 mg via INTRAMUSCULAR

## 2023-05-23 MED ORDER — BACLOFEN 5 MG PO TABS
5.0000 mg | ORAL_TABLET | Freq: Three times a day (TID) | ORAL | 0 refills | Status: DC | PRN
Start: 2023-05-23 — End: 2024-03-03

## 2023-05-23 MED ORDER — KETOROLAC TROMETHAMINE 60 MG/2ML IM SOLN
60.0000 mg | Freq: Once | INTRAMUSCULAR | Status: AC
Start: 2023-05-23 — End: 2023-05-23
  Administered 2023-05-23: 30 mg via INTRAMUSCULAR

## 2023-05-23 NOTE — Patient Instructions (Addendum)
You can use aleve twice a day and tylenol [id number] mg up to three or four times a day.   Try the muscle relaxers - see if they help with the difficulty moving I do recommend heat and gentle stretching and staying mobile. Avoid bending over or picking anything up with bad body mechanics.  Lumbosacral Strain A lumbosacral strain is an injury that causes pain in the lower back (lumbosacral spine). This injury usually happens from overstretching the muscles or ligaments along the spine. Ligaments are cord-like tissues that connect bones to each other. A strain can affect one or more muscles or ligaments. What are the causes? This condition may be caused by: A hard, direct hit to the back. Overstretching the lower back muscles. This may result from: A fall. Lifting something heavy. Repeated movements such as bending or crouching. What increases the risk? The following factors make you more likely to have a lumbosacral strain: Taking part in sports or activities that involve: A sudden twist of the back. Pushing or pulling motions. Being overweight or obese. Having poor strength and flexibility, especially tight hamstrings or weak muscles in the back or abdomen. Having too much of a curve in the lower back. Having a pelvis that is tilted forward. What are the signs or symptoms? The main symptom of this condition is pain in the lower back, at the site of the strain. Pain may also be felt down one or both legs. How is this diagnosed? This condition is diagnosed based on: Your symptoms and medical history. A physical exam. During the exam: Your health care provider may push on certain areas of your back to find the source of your pain. You may be asked to bend forward, backward, and side to side to check your pain and range of motion. You may also have imaging tests, such as X-rays and an MRI. How is this treated? This condition may be treated by: Applying heat and cold to the affected  area. Taking medicines for pain and to relax your muscles. Taking NSAIDs, such as ibuprofen, to help reduce swelling and discomfort. Doing stretching and strengthening exercises for your lower back. Symptoms usually improve within several weeks of treatment. But recovery time varies. When your symptoms improve, gradually return to your normal routine as soon as possible. This will help reduce pain, avoid stiffness, and keep muscle strength. Follow these instructions at home: Medicines Take over-the-counter and prescription medicines only as told by your health care provider. Ask your health care provider if the medicine prescribed to you: Requires you to avoid driving or using machinery. Can cause constipation. You may need to take these actions to prevent or treat constipation: Drink enough fluid to keep your urine pale yellow. Take over-the-counter or prescription medicines. Eat foods that are high in fiber, such as beans, whole grains, and fresh fruits and vegetables. Limit foods that are high in fat and processed sugars, such as fried or sweet foods. Managing pain, stiffness, and swelling     If told, put ice on the injured area. Put ice in a plastic bag. Place a towel between your skin and the bag. Leave the ice on for 20 minutes, 2-3 times a day. If told, apply heat to the affected area as often as told by your health care provider. Use the heat source that your health care provider recommends, such as a moist heat pack or a heating pad. Place a towel between your skin and the heat source. Leave the heat on  for 20-30 minutes. If your skin turns bright red, remove the heat or ice right away to prevent skin damage. The risk of damage is higher if you cannot feel pain, heat, or cold. Activity Rest as told by your health care provider. Do not stay in bed. Staying in bed for more than 1-2 days can delay your recovery. Return to your normal activities as told by your health care  provider. Ask your health care provider what activities are safe for you. Avoid activities that take a lot of energy for as long as told by your health care provider. Do exercises as told by your health care provider. This includes stretching and strengthening exercises. General instructions     Use good posture when sitting and standing. Avoid leaning forward when you sit, and avoid hunching over when you stand. Do not use any products that contain nicotine or tobacco. These products include cigarettes, chewing tobacco, and vaping devices, such as e-cigarettes. If you need help quitting, ask your health care provider. How is this prevented?  Warm up properly before physical activity, and cool down and stretch after being active. Use correct form when playing sports. Bend your knees and use correct posture when lifting heavy objects. Maintain a healthy weight. Sleep on a mattress with medium firmness to support your back. Do at least 150 minutes of moderate-intensity exercise each week, such as brisk walking or water aerobics. Try a form of exercise that takes stress off your back, such as swimming or stationary cycling. Maintain physical fitness, including: Strength. Flexibility. Contact a health care provider if: Your back pain does not improve after several weeks of treatment. Your symptoms get worse. You have a fever. Get help right away if: Your back pain is severe. You cannot stand or walk. You feel nauseous or you vomit. You develop any of the following: Trouble controlling when you urinate or when you have a bowel movement. Pain in your legs. Your feet or legs get very cold, turn pale, or look blue. Weakness in your buttocks or legs. This information is not intended to replace advice given to you by your health care provider. Make sure you discuss any questions you have with your health care provider. Document Revised: 02/03/2023 Document Reviewed: 04/23/2022 Elsevier  Patient Education  2024 [employer].

## 2023-05-23 NOTE — Telephone Encounter (Signed)
     Chief Complaint: Low back pain Symptoms: Pain Frequency: Yesterday Pertinent Negatives: Patient denies radiation Disposition: [] ED /[] Urgent Care (no appt availability in office) / [x] Appointment(In office/virtual)/ []  [facility]/ [] Home Care/ [] Refused Recommended Disposition /[] [facility]/ []  Follow-up with PCP Additional Notes: Pt. Agrees with appointment today.  Reason for Disposition  [1] SEVERE back pain (e.g., excruciating, unable to do any normal activities) AND [2] not improved 2 hours after pain medicine  Answer Assessment - Initial Assessment Questions 1. ONSET: "When did the pain begin?"      Yesterday 2. LOCATION: "Where does it hurt?" (upper, mid or lower back)     Lower 3. SEVERITY: "How bad is the pain?"  (e.g., Scale 1-10; mild, moderate, or severe)   - MILD (1-3): Doesn't interfere with normal activities.    - MODERATE (4-7): Interferes with normal activities or awakens from sleep.    - SEVERE (8-10): Excruciating pain, unable to do any normal activities.      Severe 4. PATTERN: "Is the pain constant?" (e.g., yes, no; constant, intermittent)      Constant 5. RADIATION: "Does the pain shoot into your legs or somewhere else?"     No 6. CAUSE:  "What do you think is causing the back pain?"      Fixing a car bumper 7. BACK OVERUSE:  "Any recent lifting of heavy objects, strenuous work or exercise?"     Yes 8. MEDICINES: "What have you taken so far for the pain?" (e.g., nothing, acetaminophen, NSAIDS)     Aleve 9. NEUROLOGIC SYMPTOMS: "Do you have any weakness, numbness, or problems with bowel/bladder control?"     No 10. OTHER SYMPTOMS: "Do you have any other symptoms?" (e.g., fever, abdomen pain, burning with urination, blood in urine)       No 11. PREGNANCY: "Is there any chance you are pregnant?" "When was your last menstrual period?"       N/a  Protocols used: Back Pain-A-AH

## 2023-05-23 NOTE — Progress Notes (Signed)
Patient ID: Douglas Abbott., male    DOB: April 29, 1966, 57 y.o.   MRN: [id number]  PCP: Berniece Salines, FNP  Chief Complaint  Patient presents with   Back Pain    Happened yesterday, Lower left side may have injured since he was helping out his daughter with her car and reached under a tire and might have done too much of pulling    Subjective:   Douglas L Abdel Decaprio. is a 57 y.o. male, presents to clinic with CC of the following:  HPI  Presents for low back pain He had episode of low back pain earlier this year but it did resolve after a few days No Xrays or PT done with past visit, but it was resolving at the time of OV Urine screening in office was unremarkable - the pain did occur after being ill and laying in bed a few days in a row Of note the pt did just have colonoscopy procedure. Nurse call today noted low back pain onset yesterday (colonoscopy was done on 8/7 the day before), noted to be severe and constant, started after fixing a car bumper, no improvement with aleve He denies bladder/bowel incontinence, weakness, numbness, fever, abd pain, urinary sx  Patient Active Problem List   Diagnosis Date Noted   History of colonic polyps 05/21/2023   Lower urinary tract symptoms (LUTS) 03/26/2023   Food intolerance in adult    Urinary straining 07/23/2021   Hoarse voice quality 07/23/2021   Tinea pedis of left foot 03/09/2020   Synovitis and tenosynovitis 05/11/2019   Hemorrhoids 12/30/2018   Bradycardia 09/29/2018   Borderline high cholesterol 06/29/2018   Vitamin D deficiency 06/29/2018   Right shoulder pain 09/16/2017   GERD (gastroesophageal reflux disease) 01/02/2017   Blood in the urine 09/11/2015   Asthma, chronic 03/09/2015   Allergic rhinitis 03/09/2015      Current Outpatient Medications:    albuterol (VENTOLIN HFA) 108 (90 Base) MCG/ACT inhaler, Inhale 2 puffs into the lungs every 6 (six) hours as needed for wheezing or shortness of breath., Disp: 8  g, Rfl: 0   albuterol (VENTOLIN HFA) 108 (90 Base) MCG/ACT inhaler, INHALE 1 TO 2 PUFFS INTO THE LUNGS EVERY 6 HOURS AS NEEDED FOR WHEEZING OR SHORTNESS OF BREATH, Disp: 8 g, Rfl: 2   budesonide-formoterol (SYMBICORT) 160-4.5 MCG/ACT inhaler, Inhale 2 puffs into the lungs 2 (two) times daily., Disp: 30.6 g, Rfl: 3   fluticasone (FLONASE) 50 MCG/ACT nasal spray, Place 2 sprays into both nostrils daily., Disp: 16 g, Rfl: 6   meloxicam (MOBIC) 15 MG tablet, Take 15 mg by mouth daily., Disp: , Rfl:    montelukast (SINGULAIR) 10 MG tablet, TAKE 1 TABLET(10 MG) BY MOUTH AT BEDTIME, Disp: 90 tablet, Rfl: 0   NASACORT ALLERGY 24HR 55 MCG/ACT AERO nasal inhaler, 2 sprays daily., Disp: , Rfl:    omeprazole (PRILOSEC) 20 MG capsule, Take 1 capsule (20 mg total) by mouth daily., Disp: 180 capsule, Rfl: 0   sildenafil (VIAGRA) 100 MG tablet, Take 1 tablet (100 mg total) by mouth daily as needed for erectile dysfunction., Disp: 10 tablet, Rfl: 0   tamsulosin (FLOMAX) 0.4 MG CAPS capsule, Take 2 capsules (0.8 mg total) by mouth daily., Disp: 90 capsule, Rfl: 1   Allergies  Allergen Reactions   Shellfish Allergy Swelling   Peanut-Containing Drug Products Swelling     Social History   Tobacco Use   Smoking status: Former    Current packs/day:  0.00    Average packs/day: 1 pack/day for 8.0 years (8.0 ttl pk-yrs)    Types: Cigarettes    Start date: 98    Quit date: 2000    Years since quitting: 24.6   Smokeless tobacco: Never  Vaping Use   Vaping status: Never Used  Substance Use Topics   Alcohol use: Not Currently    Comment: occ   Drug use: Yes    Frequency: 1.0 times per week    Types: Marijuana    Comment: used yesterday      Chart Review Today: I personally reviewed active problem list, medication list, allergies, family history, social history, health maintenance, notes from last encounter, lab results, imaging with the patient/caregiver today.   Review of Systems  Constitutional:  Negative.   HENT: Negative.    Eyes: Negative.   Respiratory: Negative.    Cardiovascular: Negative.   Gastrointestinal: Negative.   Endocrine: Negative.   Genitourinary: Negative.   Musculoskeletal: Negative.   Skin: Negative.   Allergic/Immunologic: Negative.   Neurological: Negative.   Hematological: Negative.   Psychiatric/Behavioral: Negative.    All other systems reviewed and are negative.      Objective:   There were no vitals filed for this visit.  There is no height or weight on file to calculate BMI.  Physical Exam Vitals and nursing note reviewed.  Constitutional:      General: He is not in acute distress.    Appearance: Normal appearance. He is well-developed. He is not diaphoretic.  HENT:     Head: Normocephalic and atraumatic.     Nose: Nose normal.     Mouth/Throat:     Mouth: Oropharynx is clear and moist.  Eyes:     General:        Right eye: No discharge.        Left eye: No discharge.     Conjunctiva/sclera: Conjunctivae normal.     Pupils: Pupils are equal, round, and reactive to light.  Neck:     Trachea: No tracheal deviation.  Cardiovascular:     Rate and Rhythm: Normal rate and regular rhythm.     Pulses:          Dorsalis pedis pulses are 2+ on the right side and 2+ on the left side.       Posterior tibial pulses are 2+ on the right side and 2+ on the left side.  Pulmonary:     Effort: Pulmonary effort is normal. No respiratory distress.     Breath sounds: No stridor.  Abdominal:     Tenderness: There is no abdominal tenderness. There is no CVA tenderness.  Musculoskeletal:     Cervical back: Normal range of motion and neck supple.     Comments: No midline tenderness from cervical to lumbar spine, no step off No paraspinal muscle ttp from cervical to thoracic spine, left lower paraspinal lumbar msk ttp   5/5 strength bilaterally with dorsiflexion, plantarflexion, flexion and extension at knees, and flexion and extension at hips Grossly  normal sensation to light touch to bilateral lower extremities Antalgic, slow gait +SLR on left   Skin:    General: Skin is warm and dry.     Capillary Refill: Capillary refill takes less than 2 seconds.     Coloration: Skin is not pale.     Findings: No rash.     Nails: There is no clubbing.  Neurological:     Mental Status: He is alert.  Sensory: No sensory deficit.     Motor: No tremor or abnormal muscle tone.     Coordination: Coordination normal.     Gait: Gait abnormal.  Psychiatric:        Mood and Affect: Mood and affect normal.        Behavior: Behavior normal.      Results for orders placed or performed in visit on 03/26/23  PSA  Result Value Ref Range   PSA 1.97 < OR = 4.00 ng/mL       Assessment & Plan:   1. Acute bilateral low back pain with left-sided sciatica Onset yesterday, pain fairly severe with limited ROM, + SLR, spasming/catching, tx with Aleve and Tylenol at home offered muscle relaxers explained side effects versus benefits and he was instructed to not drive if taking them, recommended heat therapy avoiding prolonged immobility and gentle stretching If he does not gradually improve in the next couple weeks consulting with physical therapy and/or a orthopedic or spine surgeon would be appropriate Today he has no concerning red flags including no saddle anesthesia, fever, urinary or stool incontinence - Baclofen 5 MG TABS; Take 1-2 tablets (5-10 mg total) by mouth 3 (three) times daily as needed (msk pain or spasms).  Dispense: 90 tablet; Refill: 0 - predniSONE (DELTASONE) 20 MG tablet; 2 tabs poqday 1-3, 1 tabs poqday 4-6, take in am with food in stomach  Dispense: 9 tablet; Refill: 0 - dexamethasone (DECADRON) injection 10 mg - ketorolac (TORADOL) injection 60 mg  He was given a shot of Decadron and Toradol in clinic was instructed to start the prednisone tomorrow morning with food in his stomach     Danelle Berry, PA-C 05/23/23 1:20 PM

## 2023-06-21 ENCOUNTER — Other Ambulatory Visit: Payer: Self-pay | Admitting: Nurse Practitioner

## 2023-06-21 DIAGNOSIS — J4540 Moderate persistent asthma, uncomplicated: Secondary | ICD-10-CM

## 2023-06-23 NOTE — Telephone Encounter (Signed)
Requested Prescriptions  Pending Prescriptions Disp Refills   montelukast (SINGULAIR) 10 MG tablet [Pharmacy Med Name: MONTELUKAST 10MG  TABLETS] 90 tablet 2    Sig: TAKE 1 TABLET(10 MG) BY MOUTH AT BEDTIME     Pulmonology:  Leukotriene Inhibitors Passed - 06/21/2023 10:10 AM      Passed - Valid encounter within last 12 months    Recent Outpatient Visits           1 month ago Acute bilateral low back pain with left-sided sciatica   [facility] Danelle Berry, PA-C   2 months ago Annual physical exam   [facility] Berniece Salines, FNP   3 months ago Pain of left calf   [facility] Della Goo F, FNP   8 months ago Acute bilateral low back pain without sciatica   [facility] Berniece Salines, FNP   8 months ago Viral upper respiratory tract infection   [facility] Berniece Salines, FNP       Future Appointments             In 3 months Zane Herald, Rudolpho Sevin, FNP [facility], [facility]

## 2023-07-09 DIAGNOSIS — J343 Hypertrophy of nasal turbinates: Secondary | ICD-10-CM | POA: Insufficient documentation

## 2023-07-09 DIAGNOSIS — J342 Deviated nasal septum: Secondary | ICD-10-CM | POA: Insufficient documentation

## 2023-07-09 DIAGNOSIS — J301 Allergic rhinitis due to pollen: Secondary | ICD-10-CM | POA: Insufficient documentation

## 2023-07-09 DIAGNOSIS — R0981 Nasal congestion: Secondary | ICD-10-CM | POA: Insufficient documentation

## 2023-07-17 ENCOUNTER — Ambulatory Visit: Payer: Self-pay | Admitting: *Deleted

## 2023-07-17 NOTE — Telephone Encounter (Signed)
  Chief Complaint: hip pain on left side Symptoms: Hurts to walk and get up from sitting Frequency: For the last week or so Pertinent Negatives: Patient denies falling or injuries to this side.    Disposition: [] ED /[] Urgent Care (no appt availability in office) / [x] Appointment(In office/virtual)/ []  [facility]/ [] Home Care/ [] Refused Recommended Disposition /[] [facility]/ []  Follow-up with PCP Additional Notes: Appt made with Della Goo, FNP for 07/21/2023 at 2:20.

## 2023-07-17 NOTE — Telephone Encounter (Signed)
Reason for Disposition  [1] MODERATE pain (e.g., interferes with normal activities, limping) AND [2] present > 3 days  Answer Assessment - Initial Assessment Questions 1. LOCATION and RADIATION: "Where is the pain located?"      I'm having hip pain.   When I stand up it hurts so bad I have to grab onto soemthing.   I hurt my right side a couple weeks ago but it's fine now.  I saw the doctor. Now my left hip hurts going down into my leg.   2. QUALITY: "What does the pain feel like?"  (e.g., sharp, dull, aching, burning)     It hurts when I walk and get up from sitting.   Hurts when I lay on it 3. SEVERITY: "How bad is the pain?" "What does it keep you from doing?"   (Scale 1-10; or mild, moderate, severe)   -  MILD (1-3): doesn't interfere with normal activities    -  MODERATE (4-7): interferes with normal activities (e.g., work or school) or awakens from sleep, limping    -  SEVERE (8-10): excruciating pain, unable to do any normal activities, unable to walk     Moderate 4. ONSET: "When did the pain start?" "Does it come and go, or is it there all the time?"     Going on a week or so.   5. WORK OR EXERCISE: "Has there been any recent work or exercise that involved this part of the body?"      No 6. CAUSE: "What do you think is causing the hip pain?"      I'm not sure.   7. AGGRAVATING FACTORS: "What makes the hip pain worse?" (e.g., walking, climbing stairs, running)     I hurt my other side a couple of weeks ago so I favored it. 8. OTHER SYMPTOMS: "Do you have any other symptoms?" (e.g., back pain, pain shooting down leg,  fever, rash)     No  Protocols used: Hip Pain-A-AH

## 2023-07-21 ENCOUNTER — Other Ambulatory Visit: Payer: Self-pay

## 2023-07-21 ENCOUNTER — Encounter: Payer: Self-pay | Admitting: Nurse Practitioner

## 2023-07-21 ENCOUNTER — Ambulatory Visit (INDEPENDENT_AMBULATORY_CARE_PROVIDER_SITE_OTHER): Payer: 59 | Admitting: Nurse Practitioner

## 2023-07-21 DIAGNOSIS — M5442 Lumbago with sciatica, left side: Secondary | ICD-10-CM | POA: Diagnosis not present

## 2023-07-21 DIAGNOSIS — R399 Unspecified symptoms and signs involving the genitourinary system: Secondary | ICD-10-CM | POA: Diagnosis not present

## 2023-07-21 MED ORDER — METHOCARBAMOL 500 MG PO TABS: 500.0000 mg | ORAL_TABLET | Freq: Two times a day (BID) | ORAL | 0 refills | Status: DC | PRN

## 2023-07-21 MED ORDER — KETOROLAC TROMETHAMINE 60 MG/2ML IM SOLN
60.0000 mg | Freq: Once | INTRAMUSCULAR | Status: AC
Start: 2023-07-21 — End: 2023-07-21
  Administered 2023-07-21: 60 mg via INTRAMUSCULAR

## 2023-07-21 MED ORDER — TAMSULOSIN HCL 0.4 MG PO CAPS
0.8000 mg | ORAL_CAPSULE | Freq: Every day | ORAL | 1 refills | Status: DC
Start: 2023-07-21 — End: 2023-10-21

## 2023-07-21 MED ORDER — DEXAMETHASONE SODIUM PHOSPHATE 10 MG/ML IJ SOLN
10.0000 mg | Freq: Once | INTRAMUSCULAR | Status: AC
Start: 2023-07-21 — End: 2023-07-21
  Administered 2023-07-21: 10 mg via INTRAMUSCULAR

## 2023-07-21 MED ORDER — NAPROXEN 500 MG PO TABS: 500.0000 mg | ORAL_TABLET | Freq: Two times a day (BID) | ORAL | 0 refills | Status: DC

## 2023-07-21 MED ORDER — PREDNISONE 10 MG (21) PO TBPK
ORAL_TABLET | ORAL | 0 refills | Status: DC
Start: 2023-07-21 — End: 2023-09-25

## 2023-07-21 NOTE — Assessment & Plan Note (Signed)
Refill sent.

## 2023-07-21 NOTE — Progress Notes (Signed)
BP 120/76   Pulse 71   Temp 98 F (36.7 C) (Oral)   Resp 18   Ht 6\' 1"  (1.854 m)   Wt 168 lb 6.4 oz (76.4 kg)   SpO2 99%   BMI 22.22 kg/m    Subjective:    Patient ID: Douglas Rued., male    DOB: 12-27-1965, 57 y.o.   MRN: [id number]  HPI: Douglas Lemelle. is a 57 y.o. male  Chief Complaint  Patient presents with   Hip Pain     Bilateral hip pain.Left hip worse then right , pain radiates down leg   Medication Problem    Tamulosin refill   low back/hip pain:  was seen on 05/23/2023 for right lower back pain, was treated with muscle relaxer, steroids and NSAID.  Patient reports today that he has pain in his left lower back pain that radiates down his left leg. He says that he has been having this pain for almost two weeks.   He denies any new trauma.  He says that when he was seen on 05/23/2023 it was for the right hip and he feels like he may have been leaning more on that side due to the pain in his right side.   He was given a shot of Decadron and Toradol in clinic was instructed to start the prednisone/naproxen tomorrow morning with food in his stomach  Recommend he also do heat therapy.  If no improvement will refer to physical therapy.    Relevant past medical, surgical, family and social history reviewed and updated as indicated. Interim medical history since our last visit reviewed. Allergies and medications reviewed and updated.  Review of Systems  Ten systems reviewed and is negative except as mentioned in HPI       Objective:    BP 120/76   Pulse 71   Temp 98 F (36.7 C) (Oral)   Resp 18   Ht 6\' 1"  (1.854 m)   Wt 168 lb 6.4 oz (76.4 kg)   SpO2 99%   BMI 22.22 kg/m   Wt Readings from Last 3 Encounters:  07/21/23 168 lb 6.4 oz (76.4 kg)  05/23/23 164 lb 6.4 oz (74.6 kg)  05/21/23 162 lb (73.5 kg)    Physical Exam  Constitutional: Patient appears well-developed and well-nourished.  No distress.  HEENT: head atraumatic, normocephalic, pupils  equal and reactive to light, neck supple Cardiovascular: Normal rate, regular rhythm and normal heart sounds.  No murmur heard. No BLE edema. Pulmonary/Chest: Effort normal and breath sounds normal. No respiratory distress. Abdominal: Soft.  There is no tenderness. MSK: tenderness left lower back, positive straight leg test Psychiatric: Patient has a normal mood and affect. behavior is normal. Judgment and thought content normal.  Results for orders placed or performed in visit on 03/26/23  PSA  Result Value Ref Range   PSA 1.97 < OR = 4.00 ng/mL      Assessment & Plan:   Problem List Items Addressed This Visit       Other   Lower urinary tract symptoms (LUTS)    Refill sent      Relevant Medications   tamsulosin (FLOMAX) 0.4 MG CAPS capsule   Other Visit Diagnoses     Acute bilateral low back pain with left-sided sciatica       Decadron and Toradol in clinic was instructed to start the prednisone/naproxen tomorrow morning. heat therapy.If no improvement will refer to physical therapy.   Relevant Medications  predniSONE (STERAPRED UNI-PAK 21 TAB) 10 MG (21) TBPK tablet   ketorolac (TORADOL) injection 60 mg (Completed)   dexamethasone (DECADRON) injection 10 mg (Completed)   naproxen (NAPROSYN) 500 MG tablet   methocarbamol (ROBAXIN) 500 MG tablet        Follow up plan: Return if symptoms worsen or fail to improve.

## 2023-08-16 ENCOUNTER — Other Ambulatory Visit: Payer: Self-pay | Admitting: Nurse Practitioner

## 2023-08-16 DIAGNOSIS — N529 Male erectile dysfunction, unspecified: Secondary | ICD-10-CM

## 2023-08-18 MED ORDER — SILDENAFIL CITRATE 100 MG PO TABS
100.0000 mg | ORAL_TABLET | Freq: Every day | ORAL | 0 refills | Status: DC | PRN
Start: 2023-08-18 — End: 2023-09-25

## 2023-08-21 ENCOUNTER — Other Ambulatory Visit: Payer: Self-pay | Admitting: Nurse Practitioner

## 2023-08-21 DIAGNOSIS — M5442 Lumbago with sciatica, left side: Secondary | ICD-10-CM

## 2023-08-21 NOTE — Telephone Encounter (Signed)
Requested by interface surescripts. Future visit in 1 month  Requested Prescriptions  Pending Prescriptions Disp Refills   naproxen (NAPROSYN) 500 MG tablet [Pharmacy Med Name: NAPROXEN 500MG  TABLETS] 60 tablet 0    Sig: TAKE 1 TABLET(500 MG) BY MOUTH TWICE DAILY WITH A MEAL     Analgesics:  NSAIDS Failed - 08/21/2023 10:15 AM      Failed - Manual Review: Labs are only required if the patient has taken medication for more than 8 weeks.      Failed - HGB in normal range and within 360 days    Hemoglobin  Date Value Ref Range Status  03/13/2023 13.0 (L) 13.2 - 17.1 g/dL Final  78/29/5621 30.8 12.6 - 17.7 g/dL Final         Passed - Cr in normal range and within 360 days    Creat  Date Value Ref Range Status  03/13/2023 0.96 0.70 - 1.30 mg/dL Final         Passed - PLT in normal range and within 360 days    Platelets  Date Value Ref Range Status  03/13/2023 317 140 - 400 Thousand/uL Final  09/11/2015 298 150 - 379 x10E3/uL Final         Passed - HCT in normal range and within 360 days    HCT  Date Value Ref Range Status  03/13/2023 38.9 38.5 - 50.0 % Final   Hematocrit  Date Value Ref Range Status  09/11/2015 40.7 37.5 - 51.0 % Final         Passed - eGFR is 30 or above and within 360 days    GFR, Est African American  Date Value Ref Range Status  11/25/2018 102 > OR = 60 mL/min/1.20m2 Final   GFR calc Af Amer  Date Value Ref Range Status  02/08/2020 >60 >60 mL/min Final   GFR, Est Non African American  Date Value Ref Range Status  11/25/2018 88 > OR = 60 mL/min/1.81m2 Final   GFR calc non Af Amer  Date Value Ref Range Status  02/08/2020 >60 >60 mL/min Final   eGFR  Date Value Ref Range Status  03/13/2023 93 > OR = 60 mL/min/1.29m2 Final         Passed - Patient is not pregnant      Passed - Valid encounter within last 12 months    Recent Outpatient Visits           1 month ago Acute bilateral low back pain with left-sided sciatica   [facility] Berniece Salines, FNP   3 months ago Acute bilateral low back pain with left-sided sciatica   [facility] Danelle Berry, PA-C   4 months ago Annual physical exam   [facility] Berniece Salines, FNP   5 months ago Pain of left calf   [facility] Berniece Salines, FNP   10 months ago Acute bilateral low back pain without sciatica   [facility] Berniece Salines, FNP       Future Appointments             In 1 month Zane Herald, Rudolpho Sevin, FNP [facility], [facility]

## 2023-09-17 ENCOUNTER — Other Ambulatory Visit: Payer: Self-pay | Admitting: Nurse Practitioner

## 2023-09-17 DIAGNOSIS — M5442 Lumbago with sciatica, left side: Secondary | ICD-10-CM

## 2023-09-19 NOTE — Telephone Encounter (Signed)
Requested Prescriptions  Pending Prescriptions Disp Refills   naproxen (NAPROSYN) 500 MG tablet [Pharmacy Med Name: NAPROXEN 500MG  TABLETS] 60 tablet 0    Sig: TAKE 1 TABLET(500 MG) BY MOUTH TWICE DAILY WITH A MEAL     Analgesics:  NSAIDS Failed - 09/17/2023 10:48 AM      Failed - Manual Review: Labs are only required if the patient has taken medication for more than 8 weeks.      Failed - HGB in normal range and within 360 days    Hemoglobin  Date Value Ref Range Status  03/13/2023 13.0 (L) 13.2 - 17.1 g/dL Final  86/57/8469 62.9 12.6 - 17.7 g/dL Final         Passed - Cr in normal range and within 360 days    Creat  Date Value Ref Range Status  03/13/2023 0.96 0.70 - 1.30 mg/dL Final         Passed - PLT in normal range and within 360 days    Platelets  Date Value Ref Range Status  03/13/2023 317 140 - 400 Thousand/uL Final  09/11/2015 298 150 - 379 x10E3/uL Final         Passed - HCT in normal range and within 360 days    HCT  Date Value Ref Range Status  03/13/2023 38.9 38.5 - 50.0 % Final   Hematocrit  Date Value Ref Range Status  09/11/2015 40.7 37.5 - 51.0 % Final         Passed - eGFR is 30 or above and within 360 days    GFR, Est African American  Date Value Ref Range Status  11/25/2018 102 > OR = 60 mL/min/1.58m2 Final   GFR calc Af Amer  Date Value Ref Range Status  02/08/2020 >60 >60 mL/min Final   GFR, Est Non African American  Date Value Ref Range Status  11/25/2018 88 > OR = 60 mL/min/1.52m2 Final   GFR calc non Af Amer  Date Value Ref Range Status  02/08/2020 >60 >60 mL/min Final   eGFR  Date Value Ref Range Status  03/13/2023 93 > OR = 60 mL/min/1.37m2 Final         Passed - Patient is not pregnant      Passed - Valid encounter within last 12 months    Recent Outpatient Visits           2 months ago Acute bilateral low back pain with left-sided sciatica   [facility] Berniece Salines, FNP   3 months ago  Acute bilateral low back pain with left-sided sciatica   [facility] Danelle Berry, PA-C   5 months ago Annual physical exam   [facility] Berniece Salines, FNP   6 months ago Pain of left calf   [facility] Berniece Salines, FNP   11 months ago Acute bilateral low back pain without sciatica   [facility] Berniece Salines, FNP       Future Appointments             In 6 days Zane Herald, Rudolpho Sevin, FNP [facility], [facility]

## 2023-09-24 NOTE — Progress Notes (Unsigned)
   There were no vitals taken for this visit.   Subjective:    Patient ID: Douglas Rued., male    DOB: June 09, 1966, 57 y.o.   MRN: [id number]  HPI: Douglas Omana. is a 57 y.o. male  No chief complaint on file.   Discussed the use of AI scribe software for clinical note transcription with the patient, who gave verbal consent to proceed.  History of Present Illness           07/21/2023    2:09 PM 05/23/2023    3:09 PM 03/26/2023    7:59 AM  Depression screen PHQ 2/9  Decreased Interest 0 0 0  Down, Depressed, Hopeless 0 0 0  PHQ - 2 Score 0 0 0  Altered sleeping  0   Tired, decreased energy  0   Change in appetite  0   Feeling bad or failure about yourself   0   Trouble concentrating  0   Moving slowly or fidgety/restless  0   Suicidal thoughts  0   PHQ-9 Score  0   Difficult doing work/chores  Not difficult at all     Relevant past medical, surgical, family and social history reviewed and updated as indicated. Interim medical history since our last visit reviewed. Allergies and medications reviewed and updated.  Review of Systems  Per HPI unless specifically indicated above     Objective:    There were no vitals taken for this visit.  {Vitals History (Optional):23777} Wt Readings from Last 3 Encounters:  07/21/23 168 lb 6.4 oz (76.4 kg)  05/23/23 164 lb 6.4 oz (74.6 kg)  05/21/23 162 lb (73.5 kg)    Physical Exam  Results for orders placed or performed in visit on 03/26/23  PSA  Result Value Ref Range   PSA 1.97 < OR = 4.00 ng/mL   {Labs (Optional):23779}    Assessment & Plan:   Problem List Items Addressed This Visit   None    Assessment and Plan             Follow up plan: No follow-ups on file.

## 2023-09-25 ENCOUNTER — Encounter: Payer: Self-pay | Admitting: Nurse Practitioner

## 2023-09-25 ENCOUNTER — Ambulatory Visit (INDEPENDENT_AMBULATORY_CARE_PROVIDER_SITE_OTHER): Payer: 59 | Admitting: Nurse Practitioner

## 2023-09-25 VITALS — BP 126/72 | HR 84 | Temp 98.1°F | Resp 14 | Ht 74.0 in | Wt 170.3 lb

## 2023-09-25 DIAGNOSIS — K219 Gastro-esophageal reflux disease without esophagitis: Secondary | ICD-10-CM

## 2023-09-25 DIAGNOSIS — Z130 Encounter for screening for diseases of the blood and blood-forming organs and certain disorders involving the immune mechanism: Secondary | ICD-10-CM

## 2023-09-25 DIAGNOSIS — N529 Male erectile dysfunction, unspecified: Secondary | ICD-10-CM

## 2023-09-25 DIAGNOSIS — J4540 Moderate persistent asthma, uncomplicated: Secondary | ICD-10-CM | POA: Diagnosis not present

## 2023-09-25 DIAGNOSIS — M5442 Lumbago with sciatica, left side: Secondary | ICD-10-CM

## 2023-09-25 DIAGNOSIS — G8929 Other chronic pain: Secondary | ICD-10-CM

## 2023-09-25 DIAGNOSIS — E785 Hyperlipidemia, unspecified: Secondary | ICD-10-CM

## 2023-09-25 DIAGNOSIS — R7303 Prediabetes: Secondary | ICD-10-CM | POA: Diagnosis not present

## 2023-09-25 DIAGNOSIS — R399 Unspecified symptoms and signs involving the genitourinary system: Secondary | ICD-10-CM

## 2023-09-25 MED ORDER — SILDENAFIL CITRATE 100 MG PO TABS
100.0000 mg | ORAL_TABLET | Freq: Every day | ORAL | 0 refills | Status: DC | PRN
Start: 2023-09-25 — End: 2024-04-22

## 2023-09-25 MED ORDER — OMEPRAZOLE 20 MG PO CPDR
20.0000 mg | DELAYED_RELEASE_CAPSULE | Freq: Every day | ORAL | 0 refills | Status: DC
Start: 2023-09-25 — End: 2024-04-13

## 2023-09-26 LAB — COMPLETE METABOLIC PANEL WITH GFR
AG Ratio: 2 (calc) (ref 1.0–2.5)
ALT: 17 U/L (ref 9–46)
AST: 19 U/L (ref 10–35)
Albumin: 4.3 g/dL (ref 3.6–5.1)
Alkaline phosphatase (APISO): 113 U/L (ref 35–144)
BUN: 18 mg/dL (ref 7–25)
CO2: 29 mmol/L (ref 20–32)
Calcium: 9.5 mg/dL (ref 8.6–10.3)
Chloride: 105 mmol/L (ref 98–110)
Creat: 1.12 mg/dL (ref 0.70–1.30)
Globulin: 2.1 g/dL (ref 1.9–3.7)
Glucose, Bld: 82 mg/dL (ref 65–99)
Potassium: 4.2 mmol/L (ref 3.5–5.3)
Sodium: 139 mmol/L (ref 135–146)
Total Bilirubin: 1 mg/dL (ref 0.2–1.2)
Total Protein: 6.4 g/dL (ref 6.1–8.1)
eGFR: 77 mL/min/{1.73_m2} (ref >=60)

## 2023-09-26 LAB — LIPID PANEL
Cholesterol: 199 mg/dL (ref <200)
HDL: 63 mg/dL (ref >=40)
LDL Cholesterol (Calc): 121 mg/dL — ABNORMAL HIGH
Non-HDL Cholesterol (Calc): 136 mg/dL — ABNORMAL HIGH (ref <130)
Total CHOL/HDL Ratio: 3.2 (calc) (ref <5.0)
Triglycerides: 56 mg/dL (ref <150)

## 2023-09-26 LAB — CBC WITH DIFFERENTIAL/PLATELET
Absolute Lymphocytes: 703 {cells}/uL — ABNORMAL LOW (ref 850–3900)
Absolute Monocytes: 330 {cells}/uL (ref 200–950)
Basophils Absolute: 30 {cells}/uL (ref 0–200)
Basophils Relative: 0.9 %
Eosinophils Absolute: 69 {cells}/uL (ref 15–500)
Eosinophils Relative: 2.1 %
HCT: 41.8 % (ref 38.5–50.0)
Hemoglobin: 13.6 g/dL (ref 13.2–17.1)
MCH: 29.3 pg (ref 27.0–33.0)
MCHC: 32.5 g/dL (ref 32.0–36.0)
MCV: 90.1 fL (ref 80.0–100.0)
MPV: 10.1 fL (ref 7.5–12.5)
Monocytes Relative: 10 %
Neutro Abs: 2168 {cells}/uL (ref 1500–7800)
Neutrophils Relative %: 65.7 %
Platelets: 279 10*3/uL (ref 140–400)
RBC: 4.64 10*6/uL (ref 4.20–5.80)
RDW: 14.4 % (ref 11.0–15.0)
Total Lymphocyte: 21.3 %
WBC: 3.3 10*3/uL — ABNORMAL LOW (ref 3.8–10.8)

## 2023-09-26 LAB — HEMOGLOBIN A1C
Hgb A1c MFr Bld: 5.6 %{Hb} (ref <5.7)
Mean Plasma Glucose: 114 mg/dL
eAG (mmol/L): 6.3 mmol/L

## 2023-10-05 ENCOUNTER — Telehealth: Payer: 59 | Admitting: Family Medicine

## 2023-10-05 DIAGNOSIS — J301 Allergic rhinitis due to pollen: Secondary | ICD-10-CM | POA: Diagnosis not present

## 2023-10-05 DIAGNOSIS — J339 Nasal polyp, unspecified: Secondary | ICD-10-CM | POA: Diagnosis not present

## 2023-10-05 MED ORDER — PREDNISONE 20 MG PO TABS: 20.0000 mg | ORAL_TABLET | Freq: Two times a day (BID) | ORAL | 0 refills | Status: AC

## 2023-10-05 NOTE — Progress Notes (Signed)
Virtual Visit Consent   Douglas Rued., you are scheduled for a virtual visit with a [facility] provider today. Just as with appointments in the office, your consent must be obtained to participate. Your consent will be active for this visit and any virtual visit you may have with one of our providers in the next 365 days. If you have a MyChart account, a copy of this consent can be sent to you electronically.  As this is a virtual visit, video technology does not allow for your provider to perform a traditional examination. This may limit your provider's ability to fully assess your condition. If your provider identifies any concerns that need to be evaluated in person or the need to arrange testing (such as labs, EKG, etc.), we will make arrangements to do so. Although advances in technology are sophisticated, we cannot ensure that it will always work on either your end or our end. If the connection with a video visit is poor, the visit may have to be switched to a telephone visit. With either a video or telephone visit, we are not always able to ensure that we have a secure connection.  By engaging in this virtual visit, you consent to the provision of healthcare and authorize for your insurance to be billed (if applicable) for the services provided during this visit. Depending on your insurance coverage, you may receive a charge related to this service.  I need to obtain your verbal consent now. Are you willing to proceed with your visit today? Douglas Abbott. has provided verbal consent on 10/05/2023 for a virtual visit (video or telephone). Douglas Curio, FNP  Date: 10/05/2023 2:14 PM  Virtual Visit via Video Note   I, Douglas Abbott, connected with  Douglas Abbott.  ([id number], Mar 08, 1966) on 10/05/23 at  2:15 PM EST by a video-enabled telemedicine application and verified that I am speaking with the correct person using two identifiers.  Location: Patient: Virtual Visit Location  Patient: Home Provider: Virtual Visit Location Provider: Home Office   I discussed the limitations of evaluation and management by telemedicine and the availability of in person appointments. The patient expressed understanding and agreed to proceed.    History of Present Illness: Douglas Blazejewski Calistro Oelke. is a 56 y.o. who identifies as a male who was assigned male at birth, and is being seen today for nasal polyps and difficulty breathing throught the nose. No fever. No colored drainage. No cough. .  HPI: HPI  Problems:  Patient Active Problem List   Diagnosis Date Noted   Allergic rhinitis due to pollen 07/09/2023   Hypertrophy of inferior nasal turbinate 07/09/2023   Nasal congestion 07/09/2023   Nasal septal deviation 07/09/2023   History of colonic polyps 05/21/2023   Lower urinary tract symptoms (LUTS) 03/26/2023   Food intolerance in adult    Urinary straining 07/23/2021   Hoarse voice quality 07/23/2021   Tinea pedis of left foot 03/09/2020   Synovitis and tenosynovitis 05/11/2019   Hemorrhoids 12/30/2018   Bradycardia 09/29/2018   Borderline high cholesterol 06/29/2018   Vitamin D deficiency 06/29/2018   Right shoulder pain 09/16/2017   GERD (gastroesophageal reflux disease) 01/02/2017   Hematuria 09/11/2015   Asthma, chronic 03/09/2015   Allergic rhinitis 03/09/2015    Allergies:  Allergies  Allergen Reactions   Shellfish Allergy Swelling   Peanut-Containing Drug Products Swelling   Medications:  Current Outpatient Medications:    predniSONE (DELTASONE) 20 MG tablet, Take 1 tablet (  20 mg total) by mouth 2 (two) times daily with a meal for 5 days., Disp: 10 tablet, Rfl: 0   albuterol (VENTOLIN HFA) 108 (90 Base) MCG/ACT inhaler, Inhale 2 puffs into the lungs every 6 (six) hours as needed for wheezing or shortness of breath., Disp: 8 g, Rfl: 0   Baclofen 5 MG TABS, Take 1-2 tablets (5-10 mg total) by mouth 3 (three) times daily as needed (msk pain or spasms)., Disp: 90  tablet, Rfl: 0   budesonide-formoterol (SYMBICORT) 160-4.5 MCG/ACT inhaler, Inhale 2 puffs into the lungs 2 (two) times daily., Disp: 30.6 g, Rfl: 3   meloxicam (MOBIC) 15 MG tablet, Take 15 mg by mouth daily., Disp: , Rfl:    methocarbamol (ROBAXIN) 500 MG tablet, Take 1 tablet (500 mg total) by mouth 2 (two) times daily as needed for muscle spasms., Disp: 60 tablet, Rfl: 0   montelukast (SINGULAIR) 10 MG tablet, TAKE 1 TABLET(10 MG) BY MOUTH AT BEDTIME, Disp: 90 tablet, Rfl: 2   naproxen (NAPROSYN) 500 MG tablet, TAKE 1 TABLET(500 MG) BY MOUTH TWICE DAILY WITH A MEAL, Disp: 60 tablet, Rfl: 0   NASACORT ALLERGY 24HR 55 MCG/ACT AERO nasal inhaler, 2 sprays daily., Disp: , Rfl:    omeprazole (PRILOSEC) 20 MG capsule, Take 1 capsule (20 mg total) by mouth daily., Disp: 180 capsule, Rfl: 0   sildenafil (VIAGRA) 100 MG tablet, Take 1 tablet (100 mg total) by mouth daily as needed for erectile dysfunction., Disp: 10 tablet, Rfl: 0   tamsulosin (FLOMAX) 0.4 MG CAPS capsule, Take 2 capsules (0.8 mg total) by mouth daily., Disp: 90 capsule, Rfl: 1  Observations/Objective: Patient is well-developed, well-nourished in no acute distress.  Resting comfortably  at home.  Head is normocephalic, atraumatic.  No labored breathing.  Speech is clear and coherent with logical content.  Patient is alert and oriented at baseline.    Assessment and Plan: 1. Seasonal allergic rhinitis due to pollen (Primary)  2. Nasal polyps  Increase fluids, continue flonase, UC if sx persist or worsen.   Follow Up Instructions: I discussed the assessment and treatment plan with the patient. The patient was provided an opportunity to ask questions and all were answered. The patient agreed with the plan and demonstrated an understanding of the instructions.  A copy of instructions were sent to the patient via MyChart unless otherwise noted below.     The patient was advised to call back or seek an in-person evaluation if  the symptoms worsen or if the condition fails to improve as anticipated.    Douglas Curio, FNP

## 2023-10-05 NOTE — Patient Instructions (Signed)
Nasal Polyps  Nasal polyps are growths that form in the nose. Inflammation in the nose or sinus openings can lead to changes in the tissue (mucosa) that lines these areas. Long-term inflammation causes the mucosa to grow into a polyp filled with watery mucus. Nasal polyps look like moist, gray grapes in the nose. Nasal polyps are not cancer and they do not increase the risk of cancer. Nasal polyps can be small or large and usually form in both sides of the nose. Polyps can make it hard to breathe through your nose (nasal obstruction). What are the causes? The exact cause of nasal polyps is not known. What increases the risk? You are more likely to develop nasal polyps if you have: A family history of the condition. A disease that causes inflammation in your nose or sinuses. Another condition that affects your nose or sinuses, such as: Nasal allergies (allergic rhinitis). Long-term nasal obstruction (nonallergic rhinitis). Asthma. Nasal or sinus infection, especially fungal infection. You may also be more likely to develop this condition if you: Are male. Are older than 40 years. Have a sensitivity to aspirin or alcohol. Smoke. Have a disease passed down through families that causes increased production of thick mucus (cystic fibrosis). What are the signs or symptoms? Symptoms depend on the size of the polyps. Small polyps may cause few symptoms. When symptoms develop, they may include: Nasal obstruction. Decreased senses of smell and taste. Runny nose and the feeling of mucus going down the back of the throat (postnasal drip). Headache, face pain, or sinus pressure. Snoring. Frequent nasal or sinus infections. Itchy eyes. How is this diagnosed? Nasal polyps may be diagnosed based on your symptoms, your medical history, and a physical exam of the inside of your nose. You may also have tests, such as: Nasal endoscopy. A flexible tube that has a bright light (endoscope) may be used to  check the inside of your nose. Skin or blood tests to find out if your polyps may be caused by allergies. Swabs of the mucus in your nose to test for inflammation or infection. Imaging studies such as a CT scan or MRI to see how large your polyps are and if any are in your sinuses. How is this treated? Small nasal polyps that are not causing symptoms may not need treatment. For large polyps that are causing symptoms, the goal of treatment is to reduce nasal obstruction and improve sinus drainage. Treatment may include: A medicine to reduce inflammation (steroid). This is usually the first treatment. You may have to take steroids for a short or long period of time. Short-term steroids are usually taken as pills. Long-term steroid treatment is usually in the form of nose drops or spray. Medicines to treat an underlying condition, such as allergies, asthma, or infection. Biologic medicines. These may be used for severe inflammation or if other medicines are not working. These medicines are usually given as injections or through an IV. Surgery. This may be needed to remove nasal polyps if medicine does not help. Follow these instructions at home: Medicines  Take or use over-the-counter and prescription medicines only as told by your health care provider. These may include nose drops or nasal sprays. Use solutions to wash or rinse out the inside of your nose (nasal washes or irrigations) as told by your health care provider. Do not take medicines that contain aspirin if they make your symptoms worse. Talk with your health care provider about using aspirin. General instructions Do not drink alcohol  if it makes your symptoms worse. Do not use any products that contain nicotine or tobacco. These products include cigarettes, chewing tobacco, and vaping devices, such as e-cigarettes. If you need help quitting, ask your health care provider. Keep all follow-up visits. This is important. Contact a health  care provider if: Your condition does not get better or it gets worse at home after treatment. You have a fever. You have headaches or pain in your face that is new or is getting worse. You have a bloody nose. Get help right away if: It is hard to breathe and you feel like you are not getting enough air (shortness of breath). Summary Nasal polyps are growths that form in the nose. Nasal polyps look like moist, gray grapes in the nose. Symptoms depend on the size of the polyps. For large polyps that are causing symptoms, the goal of treatment is to reduce nasal obstruction and improve sinus drainage. Treatment may include medicines or surgery. This information is not intended to replace advice given to you by your health care provider. Make sure you discuss any questions you have with your health care provider. Document Revised: 09/19/2021 Document Reviewed: 09/19/2021 Elsevier Patient Education  2024 [employer].

## 2023-10-19 ENCOUNTER — Other Ambulatory Visit: Payer: Self-pay | Admitting: Nurse Practitioner

## 2023-10-19 DIAGNOSIS — R399 Unspecified symptoms and signs involving the genitourinary system: Secondary | ICD-10-CM

## 2023-10-21 NOTE — Telephone Encounter (Signed)
 Requested Prescriptions  Pending Prescriptions Disp Refills   tamsulosin  (FLOMAX ) 0.4 MG CAPS capsule [Pharmacy Med Name: TAMSULOSIN  0.4MG  CAPSULES] 180 capsule 1    Sig: TAKE 2 CAPSULES(0.8 MG) BY MOUTH DAILY     Urology: Alpha-Adrenergic Blocker Passed - 10/21/2023  4:19 PM      Passed - PSA in normal range and within 360 days    PSA  Date Value Ref Range Status  03/26/2023 1.97 < OR = 4.00 ng/mL Final    Comment:    The total PSA value from this assay system is  standardized against the WHO standard. The test  result will be approximately 20% lower when compared  to the equimolar-standardized total PSA (Beckman  Coulter). Comparison of serial PSA results should be  interpreted with this fact in mind. . This test was performed using the Siemens  chemiluminescent method. Values obtained from  different assay methods cannot be used interchangeably. PSA levels, regardless of value, should not be interpreted as absolute evidence of the presence or absence of disease.    Prostate Specific Ag, Serum  Date Value Ref Range Status  04/05/2015 1.3 0.0 - 4.0 ng/mL Final    Comment:    Roche ECLIA methodology. According to the American Urological Association, Serum PSA should decrease and remain at undetectable levels after radical prostatectomy. The AUA defines biochemical recurrence as an initial PSA value 0.2 ng/mL or greater followed by a subsequent confirmatory PSA value 0.2 ng/mL or greater. Values obtained with different assay methods or kits cannot be used interchangeably. Results cannot be interpreted as absolute evidence of the presence or absence of malignant disease.          Passed - Last BP in normal range    BP Readings from Last 1 Encounters:  09/25/23 126/72         Passed - Valid encounter within last 12 months    Recent Outpatient Visits           3 weeks ago Moderate persistent chronic asthma without complication   [facility]  Gareth Clarity F, FNP   3 months ago Acute bilateral low back pain with left-sided sciatica   [facility] Gareth Clarity FALCON, FNP   5 months ago Acute bilateral low back pain with left-sided sciatica   [facility] Leavy Mole, PA-C   6 months ago Annual physical exam   [facility] Gareth Clarity FALCON, FNP   7 months ago Pain of left calf   [facility] Gareth Clarity FALCON, FNP       Future Appointments             In 5 months Gareth, Clarity FALCON, FNP [facility], [facility]

## 2023-10-22 ENCOUNTER — Ambulatory Visit: Payer: Self-pay | Admitting: *Deleted

## 2023-10-22 NOTE — Telephone Encounter (Signed)
  Chief Complaint: possible allergic reaction, left bottom lip swollen  Symptoms: ate breakfast at [employer] this am and then noted bottom lip swelling left lower side. Was itching at first but now no itching no redness no pain . Since this am has taken bendadryl 3 times. 25 mg tablets and 2 tablets taken last at approx 2 pm.  Frequency: this am before 9:30am Pertinent Negatives: Patient denies chest pain no difficulty breathing no fever no tongue swelling no other areas on body swollen  Disposition: [] ED /[] Urgent Care (no appt availability in office) / [] Appointment(In office/virtual)/ []  [place] [facility]/ [] Home Care/ [] Refused Recommended Disposition /[] [place] [facility]/ [x]  Follow-up with PCP Additional Notes:   Patient reports he has been seen for this issue before. No appt scheduled at this time.  Swelling has occurred before in nose approx. 3 weeks ago and now lip. Please advise if patient needs to come in for evaluation or if medication can be given. Patient would like a call back today please .  Recommended if sx worsen go to ED.       Reason for Disposition  [1] Mild lip swelling from food reaction AND [2] diagnosis never confirmed by a doctor (or NP/PA)  Answer Assessment - Initial Assessment Questions 1. ONSET: When did the swelling start? (e.g., minutes, hours, days)     This am after eating breakfast with fries from [employer] 2. SEVERITY: How swollen is it?     Bottom left lower lip swollen  3. ITCHING: Is there any itching? If Yes, ask: How much?   (Scale 1-10; mild, moderate or severe)     No itching , has went away 4. PAIN: Is the swelling painful to touch? If Yes, ask: How painful is it?   (Scale 1-10; mild, moderate or severe)     No pain  5. CAUSE: What do you think is causing the lip swelling?     Not sure  6. RECURRENT SYMPTOM: Have you had lip swelling before? If Yes, ask: When was the last time? What happened that time?     Has had  other areas of body swelling  7. OTHER SYMPTOMS: Do you have any other symptoms? (e.g., toothache)     No other sx  8. PREGNANCY: Is there any chance you are pregnant? When was your last menstrual period?     na  Protocols used: Lip Swelling-A-AH

## 2023-10-22 NOTE — Telephone Encounter (Signed)
 Pt.notified

## 2023-10-23 ENCOUNTER — Other Ambulatory Visit: Payer: Self-pay | Admitting: Nurse Practitioner

## 2023-10-23 DIAGNOSIS — M5442 Lumbago with sciatica, left side: Secondary | ICD-10-CM

## 2023-10-27 NOTE — Telephone Encounter (Signed)
 Requested Prescriptions  Pending Prescriptions Disp Refills   naproxen  (NAPROSYN ) 500 MG tablet [Pharmacy Med Name: NAPROXEN  500MG  TABLETS] 180 tablet 0    Sig: TAKE 1 TABLET(500 MG) BY MOUTH TWICE DAILY WITH A MEAL     Analgesics:  NSAIDS Failed - 10/27/2023 11:08 AM      Failed - Manual Review: Labs are only required if the patient has taken medication for more than 8 weeks.      Passed - Cr in normal range and within 360 days    Creat  Date Value Ref Range Status  09/25/2023 1.12 0.70 - 1.30 mg/dL Final         Passed - HGB in normal range and within 360 days    Hemoglobin  Date Value Ref Range Status  09/25/2023 13.6 13.2 - 17.1 g/dL Final  88/71/7983 86.6 12.6 - 17.7 g/dL Final         Passed - PLT in normal range and within 360 days    Platelets  Date Value Ref Range Status  09/25/2023 279 140 - 400 Thousand/uL Final  09/11/2015 298 150 - 379 x10E3/uL Final         Passed - HCT in normal range and within 360 days    HCT  Date Value Ref Range Status  09/25/2023 41.8 38.5 - 50.0 % Final   Hematocrit  Date Value Ref Range Status  09/11/2015 40.7 37.5 - 51.0 % Final         Passed - eGFR is 30 or above and within 360 days    GFR, Est African American  Date Value Ref Range Status  11/25/2018 102 > OR = 60 mL/min/1.29m2 Final   GFR calc Af Amer  Date Value Ref Range Status  02/08/2020 >60 >60 mL/min Final   GFR, Est Non African American  Date Value Ref Range Status  11/25/2018 88 > OR = 60 mL/min/1.65m2 Final   GFR calc non Af Amer  Date Value Ref Range Status  02/08/2020 >60 >60 mL/min Final   eGFR  Date Value Ref Range Status  09/25/2023 77 > OR = 60 mL/min/1.79m2 Final         Passed - Patient is not pregnant      Passed - Valid encounter within last 12 months    Recent Outpatient Visits           1 month ago Moderate persistent chronic asthma without complication   [facility] Gareth Clarity F, FNP   3 months ago Acute  bilateral low back pain with left-sided sciatica   [facility] Gareth Clarity F, FNP   5 months ago Acute bilateral low back pain with left-sided sciatica   [facility] Leavy Mole, PA-C   7 months ago Annual physical exam   [facility] Gareth Clarity FALCON, FNP   7 months ago Pain of left calf   [facility] Gareth Clarity FALCON, FNP       Future Appointments             In 5 months Gareth, Clarity FALCON, FNP [facility]

## 2023-11-06 NOTE — Progress Notes (Signed)
BP 128/72   Pulse 86   Temp 98 F (36.7 C)   Resp 18   Ht 6\' 2"  (1.88 m)   Wt 178 lb 11.2 oz (81.1 kg)   SpO2 98%   BMI 22.94 kg/m    Subjective:    Patient ID: Douglas Abbott., male    DOB: 1965/11/24, 58 y.o.   MRN: [id number]  HPI: Douglas Abbott. is a 58 y.o. male  Chief Complaint  Patient presents with   URI    Onset since last appt in Dec, cough, hoarseness, congestion and lots of phlegm     Discussed the use of AI scribe software for clinical note transcription with the patient, who gave verbal consent to proceed.  History of Present Illness   The patient, with a history of asthma, presents with a persistent cough, sinus congestion, chest congestion, and phlegm production since December 22nd. Despite increasing fluid intake and continuing Flonase as advised during a virtual appointment with another provider, he reports no improvement in symptoms. The patient denies experiencing fever. He also reports a 'nose problem' which he believes is contributing to his symptoms.       09/25/2023    7:44 AM 07/21/2023    2:09 PM 05/23/2023    3:09 PM  Depression screen PHQ 2/9  Decreased Interest 0 0 0  Down, Depressed, Hopeless 0 0 0  PHQ - 2 Score 0 0 0  Altered sleeping   0  Tired, decreased energy   0  Change in appetite   0  Feeling bad or failure about yourself    0  Trouble concentrating   0  Moving slowly or fidgety/restless   0  Suicidal thoughts   0  PHQ-9 Score   0  Difficult doing work/chores   Not difficult at all    Relevant past medical, surgical, family and social history reviewed and updated as indicated. Interim medical history since our last visit reviewed. Allergies and medications reviewed and updated.  Review of Systems  Ten systems reviewed and is negative except as mentioned in HPI      Objective:    BP 128/72   Pulse 86   Temp 98 F (36.7 C)   Resp 18   Ht 6\' 2"  (1.88 m)   Wt 178 lb 11.2 oz (81.1 kg)   SpO2 98%   BMI 22.94  kg/m    Wt Readings from Last 3 Encounters:  11/07/23 178 lb 11.2 oz (81.1 kg)  09/25/23 170 lb 4.8 oz (77.2 kg)  07/21/23 168 lb 6.4 oz (76.4 kg)    Physical Exam Vitals reviewed.  Constitutional:      Appearance: Normal appearance.  HENT:     Head: Normocephalic.     Right Ear: Tympanic membrane normal.     Left Ear: Tympanic membrane normal.     Nose: Nasal tenderness, mucosal edema, congestion and rhinorrhea present. Rhinorrhea is clear and bloody.     Right Sinus: Maxillary sinus tenderness and frontal sinus tenderness present.     Left Sinus: Maxillary sinus tenderness and frontal sinus tenderness present.     Mouth/Throat:     Mouth: Mucous membranes are dry.     Pharynx: Oropharynx is clear. No oropharyngeal exudate or posterior oropharyngeal erythema.  Eyes:     Extraocular Movements: Extraocular movements intact.     Conjunctiva/sclera: Conjunctivae normal.     Pupils: Pupils are equal, round, and reactive to light.  Cardiovascular:  Rate and Rhythm: Normal rate and regular rhythm.  Pulmonary:     Effort: Pulmonary effort is normal.     Breath sounds: Normal breath sounds.  Lymphadenopathy:     Cervical: No cervical adenopathy.  Skin:    General: Skin is warm and dry.  Neurological:     General: No focal deficit present.     Mental Status: He is alert and oriented to person, place, and time. Mental status is at baseline.  Psychiatric:        Mood and Affect: Mood normal.        Behavior: Behavior normal.        Thought Content: Thought content normal.        Judgment: Judgment normal.     Results for orders placed or performed in visit on 09/25/23  CBC with Differential/Platelet   Collection Time: 09/25/23  8:18 AM  Result Value Ref Range   WBC 3.3 (L) 3.8 - 10.8 Thousand/uL   RBC 4.64 4.20 - 5.80 Million/uL   Hemoglobin 13.6 13.2 - 17.1 g/dL   HCT 16.1 09.6 - 04.5 %   MCV 90.1 80.0 - 100.0 fL   MCH 29.3 27.0 - 33.0 pg   MCHC 32.5 32.0 - 36.0 g/dL    RDW 40.9 81.1 - 91.4 %   Platelets 279 140 - 400 Thousand/uL   MPV 10.1 7.5 - 12.5 fL   Neutro Abs 2,168 1,500 - 7,800 cells/uL   Absolute Lymphocytes 703 (L) 850 - 3,900 cells/uL   Absolute Monocytes 330 200 - 950 cells/uL   Eosinophils Absolute 69 15 - 500 cells/uL   Basophils Absolute 30 0 - 200 cells/uL   Neutrophils Relative % 65.7 %   Total Lymphocyte 21.3 %   Monocytes Relative 10.0 %   Eosinophils Relative 2.1 %   Basophils Relative 0.9 %  COMPLETE METABOLIC PANEL WITH GFR   Collection Time: 09/25/23  8:18 AM  Result Value Ref Range   Glucose, Bld 82 65 - 99 mg/dL   BUN 18 7 - 25 mg/dL   Creat 7.82 9.56 - 2.13 mg/dL   eGFR 77 > OR = 60 YQ/MVH/8.46N6   BUN/Creatinine Ratio SEE NOTE: 6 - 22 (calc)   Sodium 139 135 - 146 mmol/L   Potassium 4.2 3.5 - 5.3 mmol/L   Chloride 105 98 - 110 mmol/L   CO2 29 20 - 32 mmol/L   Calcium 9.5 8.6 - 10.3 mg/dL   Total Protein 6.4 6.1 - 8.1 g/dL   Albumin 4.3 3.6 - 5.1 g/dL   Globulin 2.1 1.9 - 3.7 g/dL (calc)   AG Ratio 2.0 1.0 - 2.5 (calc)   Total Bilirubin 1.0 0.2 - 1.2 mg/dL   Alkaline phosphatase (APISO) 113 35 - 144 U/L   AST 19 10 - 35 U/L   ALT 17 9 - 46 U/L  Lipid panel   Collection Time: 09/25/23  8:18 AM  Result Value Ref Range   Cholesterol 199 <200 mg/dL   HDL 63 > OR = 40 mg/dL   Triglycerides 56 <295 mg/dL   LDL Cholesterol (Calc) 121 (H) mg/dL (calc)   Total CHOL/HDL Ratio 3.2 <5.0 (calc)   Non-HDL Cholesterol (Calc) 136 (H) <130 mg/dL (calc)  Hemoglobin M8U   Collection Time: 09/25/23  8:18 AM  Result Value Ref Range   Hgb A1c MFr Bld 5.6 <5.7 % of total Hgb   Mean Plasma Glucose 114 mg/dL   eAG (mmol/L) 6.3 mmol/L  Assessment & Plan:   Problem List Items Addressed This Visit   None Visit Diagnoses       Acute non-recurrent pansinusitis    -  Primary   Relevant Medications   amoxicillin-clavulanate (AUGMENTIN) 875-125 MG tablet   fexofenadine-pseudoephedrine (ALLEGRA-D ALLERGY & CONGESTION)  60-120 MG 12 hr tablet        Assessment and Plan    sinus infection Persistent cough, sinus congestion, chest congestion, and phlegm since December 22nd. No improvement with increased fluids and Flonase. Likely bacterial sinus infection given the duration of symptoms. -Start Augmentin. -Continue Flonase. -Add Allegra D. -Continue Mucinex. -Drink plenty of fluids. -Avoid dairy products.  Asthma No current exacerbation. -Continue current management.  General Health Maintenance / Followup Plans -Message provider with progress.        Follow up plan: Return if symptoms worsen or fail to improve.

## 2023-11-07 ENCOUNTER — Ambulatory Visit (INDEPENDENT_AMBULATORY_CARE_PROVIDER_SITE_OTHER): Payer: BC Managed Care – PPO | Admitting: Nurse Practitioner

## 2023-11-07 ENCOUNTER — Encounter: Payer: Self-pay | Admitting: Nurse Practitioner

## 2023-11-07 VITALS — BP 128/72 | HR 86 | Temp 98.0°F | Resp 18 | Ht 74.0 in | Wt 178.7 lb

## 2023-11-07 DIAGNOSIS — J0140 Acute pansinusitis, unspecified: Secondary | ICD-10-CM

## 2023-11-07 MED ORDER — FEXOFENADINE-PSEUDOEPHED ER 60-120 MG PO TB12: 1.0000 | ORAL_TABLET | Freq: Two times a day (BID) | ORAL | 0 refills | Status: DC

## 2023-11-07 MED ORDER — AMOXICILLIN-POT CLAVULANATE 875-125 MG PO TABS: 1.0000 | ORAL_TABLET | Freq: Two times a day (BID) | ORAL | 0 refills | Status: DC

## 2023-12-04 ENCOUNTER — Other Ambulatory Visit: Payer: Self-pay | Admitting: Nurse Practitioner

## 2023-12-04 DIAGNOSIS — J0140 Acute pansinusitis, unspecified: Secondary | ICD-10-CM

## 2023-12-04 NOTE — Telephone Encounter (Unsigned)
Copied from CRM [id number]. Topic: Clinical - Medication Refill >> Dec 04, 2023  3:58 PM Alessandra Bevels wrote: Most Recent Primary Care Visit:  Provider: Berniece Salines  Department: [facility]  Visit Type: OFFICE VISIT  Date: 11/07/2023  Medication: fexofenadine-pseudoephedrine (ALLEGRA-D ALLERGY & CONGESTION) 60-120 MG 12 hr tablet [[id number]]  Has the patient contacted their pharmacy? Yes (Agent: If no, request that the patient contact the pharmacy for the refill. If patient does not wish to contact the pharmacy document the reason why and proceed with request.) (Agent: If yes, when and what did the pharmacy advise?)  Is this the correct pharmacy for this prescription? Yes If no, delete pharmacy and type the correct one.  This is the patient's preferred pharmacy:  [id number] - [place], [place] - [address] AT [facility] OF [address] [place] [address] Phone: [phone] Fax: [phone]    Has the prescription been filled recently? Yes    Has the patient been seen for an appointment in the last year OR does the patient have an upcoming appointment? Yes  Can we respond through MyChart? Yes  Agent: Please be advised that Rx refills may take up to 3 business days. We ask that you follow-up with your pharmacy.

## 2023-12-05 MED ORDER — FEXOFENADINE-PSEUDOEPHED ER 60-120 MG PO TB12: 1.0000 | ORAL_TABLET | Freq: Two times a day (BID) | ORAL | 0 refills | Status: DC

## 2023-12-05 NOTE — Telephone Encounter (Signed)
Requested medication (s) are due for refill today: yes  Requested medication (s) are on the active medication list: yes  Last refill:  11/07/23  Future visit scheduled: yes  Notes to clinic:  Unable to refill per protocol, cannot delegate.      Requested Prescriptions  Pending Prescriptions Disp Refills   fexofenadine-pseudoephedrine (ALLEGRA-D ALLERGY & CONGESTION) 60-120 MG 12 hr tablet 60 tablet 0    Sig: Take 1 tablet by mouth 2 (two) times daily.     Not Delegated - Ear, Nose, and Throat:  Combination Products with Pseudoephedrine Failed - 12/05/2023  1:39 PM      Failed - This refill cannot be delegated      Passed - Last BP in normal range    BP Readings from Last 1 Encounters:  11/07/23 128/72         Passed - Valid encounter within last 12 months    Recent Outpatient Visits           4 weeks ago Acute non-recurrent pansinusitis   [facility] Della Goo F, FNP   2 months ago Moderate persistent chronic asthma without complication   [facility] Della Goo F, FNP   4 months ago Acute bilateral low back pain with left-sided sciatica   [facility] Berniece Salines, FNP   6 months ago Acute bilateral low back pain with left-sided sciatica   [facility] Danelle Berry, PA-C   8 months ago Annual physical exam   [facility] Berniece Salines, FNP       Future Appointments             In 3 months Zane Herald, Rudolpho Sevin, FNP [facility], [facility]

## 2024-01-10 ENCOUNTER — Other Ambulatory Visit: Payer: Self-pay | Admitting: Nurse Practitioner

## 2024-01-10 DIAGNOSIS — J0140 Acute pansinusitis, unspecified: Secondary | ICD-10-CM

## 2024-01-12 NOTE — Telephone Encounter (Signed)
 Requested medications are due for refill today.  yes  Requested medications are on the active medications list.  yes  Last refill. 12/05/2023 #60 0 rf  Future visit scheduled.   yes  Notes to clinic.  Refill not delegated.    Requested Prescriptions  Pending Prescriptions Disp Refills   ALLEGRA-D ALLERGY & CONGESTION 60-120 MG 12 hr tablet [Pharmacy Med Name: ALLEGRA-D 12HR TABLETS 30'S (OTC)] 60 tablet 0    Sig: TAKE 1 TABLET BY MOUTH TWICE DAILY     Not Delegated - Ear, Nose, and Throat:  Combination Products with Pseudoephedrine Failed - 01/12/2024  4:18 PM      Failed - This refill cannot be delegated      Failed - Valid encounter within last 12 months    Recent Outpatient Visits   None     Future Appointments             In 2 months Douglas Salines, FNP [facility]            Passed - Last BP in normal range    BP Readings from Last 1 Encounters:  11/07/23 128/72

## 2024-01-16 ENCOUNTER — Encounter: Payer: Self-pay | Admitting: Nurse Practitioner

## 2024-01-16 ENCOUNTER — Ambulatory Visit (INDEPENDENT_AMBULATORY_CARE_PROVIDER_SITE_OTHER): Admitting: Nurse Practitioner

## 2024-01-16 VITALS — BP 114/66 | HR 66 | Temp 97.7°F | Resp 18 | Ht 74.0 in | Wt 170.1 lb

## 2024-01-16 DIAGNOSIS — R399 Unspecified symptoms and signs involving the genitourinary system: Secondary | ICD-10-CM

## 2024-01-16 DIAGNOSIS — R350 Frequency of micturition: Secondary | ICD-10-CM | POA: Diagnosis not present

## 2024-01-16 DIAGNOSIS — Z125 Encounter for screening for malignant neoplasm of prostate: Secondary | ICD-10-CM | POA: Diagnosis not present

## 2024-01-16 LAB — POCT URINALYSIS DIPSTICK
Bilirubin, UA: NEGATIVE
Blood, UA: NEGATIVE
Glucose, UA: NEGATIVE
Ketones, UA: NEGATIVE
Leukocytes, UA: NEGATIVE
Nitrite, UA: NEGATIVE
Odor: NORMAL
Protein, UA: NEGATIVE
Spec Grav, UA: 1.02 (ref 1.010–1.025)
Urobilinogen, UA: 0.2 U/dL
pH, UA: 6 (ref 5.0–8.0)

## 2024-01-16 NOTE — Progress Notes (Signed)
 BP 114/66   Pulse 66   Temp 97.7 F (36.5 C)   Resp 18   Ht 6\' 2"  (1.88 m)   Wt 170 lb 1.6 oz (77.2 kg)   SpO2 98%   BMI 21.84 kg/m    Subjective:    Patient ID: Douglas Rued., male    DOB: [birth year], 58 y.o.   MRN: [id number]  HPI: Douglas Smead. is a 58 y.o. male Patient presents to clinic with complaints of worsening urinary hesitancy, weak stream, dribbling.  Patient had reported similar episodes back on 03/26/2023 and we increased his flomax to 0.8 mg daily. PSA was done and was in normal range.  Patient here today reporting symptoms have not improved.  Patient's IPSS score rated 18 and reports a decreased quality of life. Patient denies and burning or pain during urination. Patient also denies any blood in urine, fever, chills, or back pain. Will repeat psa and refer to urology.   Chief Complaint  Patient presents with   Urinary Tract Infection    Frequency, urgency, weak stream    IPSS     Row Name 01/16/24 1417         International Prostate Symptom Score   How often have you had the sensation of not emptying your bladder? About half the time     How often have you had to urinate less than every two hours? Not at All     How often have you found you stopped and started again several times when you urinated? More than half the time     How often have you found it difficult to postpone urination? About half the time     How often have you had a weak urinary stream? More than half the time     How often have you had to strain to start urination? About half the time     How many times did you typically get up at night to urinate? 1 Time     Total IPSS Score 18       Quality of Life due to urinary symptoms   If you were to spend the rest of your life with your urinary condition just the way it is now how would you feel about that? Terrible                  09/25/2023    7:44 AM 07/21/2023    2:09 PM 05/23/2023    3:09 PM  Depression screen PHQ 2/9   Decreased Interest 0 0 0  Down, Depressed, Hopeless 0 0 0  PHQ - 2 Score 0 0 0  Altered sleeping   0  Tired, decreased energy   0  Change in appetite   0  Feeling bad or failure about yourself    0  Trouble concentrating   0  Moving slowly or fidgety/restless   0  Suicidal thoughts   0  PHQ-9 Score   0  Difficult doing work/chores   Not difficult at all    Relevant past medical, surgical, family and social history reviewed and updated as indicated. Interim medical history since our last visit reviewed. Allergies and medications reviewed and updated.  Review of Systems Ten systems reviewed and is negative except as mentioned in HPI  Per HPI unless specifically indicated above     Objective:    BP 114/66   Pulse 66   Temp 97.7 F (36.5 C)   Resp 18  Ht 6\' 2"  (1.88 m)   Wt 170 lb 1.6 oz (77.2 kg)   SpO2 98%   BMI 21.84 kg/m    Wt Readings from Last 3 Encounters:  01/16/24 170 lb 1.6 oz (77.2 kg)  11/07/23 178 lb 11.2 oz (81.1 kg)  09/25/23 170 lb 4.8 oz (77.2 kg)    Physical Exam Vitals reviewed.  Constitutional:      Appearance: Normal appearance.  HENT:     Head: Normocephalic.  Cardiovascular:     Rate and Rhythm: Normal rate and regular rhythm.  Pulmonary:     Effort: Pulmonary effort is normal.     Breath sounds: Normal breath sounds.  Musculoskeletal:        General: Normal range of motion.  Skin:    General: Skin is warm and dry.  Neurological:     General: No focal deficit present.     Mental Status: He is alert and oriented to person, place, and time. Mental status is at baseline.  Psychiatric:        Mood and Affect: Mood normal.        Behavior: Behavior normal.        Thought Content: Thought content normal.        Judgment: Judgment normal.     Results for orders placed or performed in visit on 01/16/24  POCT Urinalysis Dipstick   Collection Time: 01/16/24  2:04 PM  Result Value Ref Range   Color, UA drk yellow    Clarity, UA clear     Glucose, UA Negative Negative   Bilirubin, UA neg    Ketones, UA neg    Spec Grav, UA 1.020 1.010 - 1.025   Blood, UA neg    pH, UA 6.0 5.0 - 8.0   Protein, UA Negative Negative   Urobilinogen, UA 0.2 0.2 or 1.0 E.U./dL   Nitrite, UA neg    Leukocytes, UA Negative Negative   Appearance clear    Odor normal        Assessment & Plan:   Problem List Items Addressed This Visit       Other   Lower urinary tract symptoms (LUTS)   Taking flomax 0.8 mg daily  no improvement, referral placed to urology      Relevant Orders   Ambulatory referral to Urology   Other Visit Diagnoses       Frequency of urination    -  Primary   Patient currently taking flomax 0.8mg  daily, PSA pending, urology referal, urine dip negative   Relevant Orders   POCT Urinalysis Dipstick (Completed)   PSA     Prostate cancer screening       Relevant Orders   PSA           Follow up plan: Return if symptoms worsen or fail to improve.

## 2024-01-16 NOTE — Assessment & Plan Note (Signed)
 Taking flomax 0.8 mg daily  no improvement, referral placed to urology

## 2024-01-17 LAB — PSA: PSA: 2.61 ng/mL (ref <=4.00)

## 2024-01-19 ENCOUNTER — Encounter: Payer: Self-pay | Admitting: Nurse Practitioner

## 2024-02-05 ENCOUNTER — Other Ambulatory Visit: Payer: Self-pay

## 2024-02-05 DIAGNOSIS — R399 Unspecified symptoms and signs involving the genitourinary system: Secondary | ICD-10-CM

## 2024-02-06 ENCOUNTER — Other Ambulatory Visit: Payer: Self-pay | Admitting: Nurse Practitioner

## 2024-02-06 DIAGNOSIS — J0140 Acute pansinusitis, unspecified: Secondary | ICD-10-CM

## 2024-02-06 NOTE — Telephone Encounter (Signed)
 Requested medication (s) are due for refill today: Yes  Requested medication (s) are on the active medication list: Yes  Last refill:  01/12/24  Future visit scheduled: Yes  Notes to clinic:  Unable to refill per protocol, cannot delegate.      Requested Prescriptions  Pending Prescriptions Disp Refills   ALLEGRA-D ALLERGY  & CONGESTION 60-120 MG 12 hr tablet [Pharmacy Med Name: ALLEGRA-D 12HR TABLETS 30'S (OTC)] 60 tablet 0    Sig: TAKE 1 TABLET BY MOUTH TWICE DAILY     Not Delegated - Ear, Nose, and Throat:  Combination Products with Pseudoephedrine Failed - 02/06/2024  1:22 PM      Failed - This refill cannot be delegated      Failed - Valid encounter within last 12 months    Recent Outpatient Visits           3 weeks ago Frequency of urination   [facility] Quinton Buckler, FNP       Future Appointments             In 4 days Lawerence Pressman, MD [facility] [place]   In 1 month Quinton Buckler, FNP [facility]            Passed - Last BP in normal range    BP Readings from Last 1 Encounters:  01/16/24 114/66

## 2024-02-10 ENCOUNTER — Ambulatory Visit (INDEPENDENT_AMBULATORY_CARE_PROVIDER_SITE_OTHER): Payer: Self-pay | Admitting: Urology

## 2024-02-10 ENCOUNTER — Other Ambulatory Visit
Admission: RE | Admit: 2024-02-10 | Discharge: 2024-02-10 | Disposition: A | Discharge status: Home / Self Care | Attending: Urology | Admitting: Urology

## 2024-02-10 ENCOUNTER — Encounter: Payer: Self-pay | Admitting: Urology

## 2024-02-10 VITALS — BP 107/67 | HR 71 | Ht 74.0 in | Wt 174.0 lb

## 2024-02-10 DIAGNOSIS — R399 Unspecified symptoms and signs involving the genitourinary system: Secondary | ICD-10-CM

## 2024-02-10 DIAGNOSIS — R3129 Other microscopic hematuria: Secondary | ICD-10-CM | POA: Diagnosis not present

## 2024-02-10 DIAGNOSIS — N138 Other obstructive and reflux uropathy: Secondary | ICD-10-CM

## 2024-02-10 LAB — URINALYSIS, COMPLETE (UACMP) WITH MICROSCOPIC
Bilirubin Urine: NEGATIVE
Glucose, UA: NEGATIVE mg/dL
Ketones, ur: NEGATIVE mg/dL
Leukocytes,Ua: NEGATIVE
Nitrite: NEGATIVE
Protein, ur: NEGATIVE mg/dL
Specific Gravity, Urine: 1.02 (ref 1.005–1.030)
Squamous Epithelial / HPF: NONE SEEN /HPF (ref 0–5)
WBC, UA: NONE SEEN WBC/hpf (ref 0–5)
pH: 5.5 (ref 5.0–8.0)

## 2024-02-10 LAB — BLADDER SCAN AMB NON-IMAGING: Scan Result: 85

## 2024-02-10 NOTE — Progress Notes (Signed)
 02/10/24 4:16 PM   Douglas Abbott. [birth year] [id number]  CC: Urinary symptoms, PSA screening  HPI: 58 year old male referred for worsening urinary symptoms with dribbling, weak stream, and intermittency.  Symptoms seem to come and go.  He has noticed them for about 3 to 4 months.  He has been on Flomax  long-term, was increased to double dose at some point which he felt initially improved his symptoms.  He reports a long history of microscopic hematuria, reported negative workup previously, but no records available to me.  He denies any fevers, chills, or dysuria.  Urinalysis today 11-20 RBC, calcium oxalate crystals present, no other abnormalities.  PVR normal at 85ml.  IPSS score today 16.  PSA has been normal, most recently 2.61.  PMH: Past Medical History:  Diagnosis Date   Allergic rhinitis    Allergy     Asthma    BRBPR (bright red blood per rectum) 03/27/2018   Decreased libido    Dysrhythmia    History of pleurisy    Sinus bradycardia    a. 03/2017 Echo: Ef 50-55%.    Surgical History: Past Surgical History:  Procedure Laterality Date   COLONOSCOPY WITH PROPOFOL  N/A 05/04/2018   Procedure: COLONOSCOPY WITH PROPOFOL ;  Surgeon: Luke Salaam, MD;  Location[facility] ENDOSCOPY;  Service: Gastroenterology;  Laterality: N/A;   COLONOSCOPY WITH PROPOFOL  N/A 05/21/2023   Procedure: COLONOSCOPY WITH PROPOFOL ;  Surgeon: Selena Daily, MD;  Location[facility] END[facility]COPY;  Service: Gastroenterology;  Laterality: N/A;   ESOPHAGOGASTRODUODENOSCOPY (EGD) WITH PROPOFOL  N/A 04/10/2022   Procedure: ESOPHAGOGASTRODUODENOSCOPY (EGD) WITH PROPOFOL ;  Surgeon: Selena Daily, MD;  Location: [facility] ENDOSCOPY;  Service: Gastroenterology;  Laterality: N/A;   NO PAST SURGERIES       Family History: Family History  Problem Relation Age of Onset   Asthma Mother    Lung cancer Mother    Kidney disease Mother    Heart disease Neg Hx     Social History:  reports that he quit smoking about 25  years ago. His smoking use included cigarettes. He started smoking about 33 years ago. He has a 8 pack-year smoking history. He has never used smokeless tobacco. He reports that he does not currently use alcohol. He reports current drug use. Frequency: 1.00 time per week. Drug: Marijuana.  Physical Exam: BP 107/67   Pulse 71   Ht 6\' 2"  (1.88 m)   Wt 174 lb (78.9 kg)   BMI 22.34 kg/m    Constitutional:  Alert and oriented, No acute distress. Cardiovascular: No clubbing, cyanosis, or edema. Respiratory: Normal respiratory effort, no increased work of breathing. GI: Abdomen is soft, nontender, nondistended, no abdominal masses   Laboratory Data: See HPI   Assessment & Plan:   58 year old male with worsening urinary symptoms with intermittent weak stream and dribbling despite max dose Flomax , microscopic urinalysis and calcium oxalate crystals on urinalysis today.  We discussed common possible etiologies of microscopic hematuria including idiopathic, urolithiasis, medical renal disease, and malignancy. We discussed the new asymptomatic microscopic hematuria guidelines and risk categories of low, intermediate, and high risk that are based on age, risk factors like smoking, and degree of microscopic hematuria. We discussed work-up can range from repeat urinalysis, renal ultrasound and cystoscopy, to CT urogram and cystoscopy.  They fall into the high risk category, and I recommended proceeding with CT and cystoscopy, will also evaluate prostate volume and anatomy for consideration of outlet procedures if microscopic hematuria workup benign.  Could also consider finasteride if defer  surgical options.  Follow-up for CT urogram and cystoscopy   Jay Meth, MD 02/10/2024  [facility] [address] [place], [place] [address] [phone]

## 2024-02-10 NOTE — Patient Instructions (Signed)

## 2024-02-13 ENCOUNTER — Ambulatory Visit
Admission: RE | Admit: 2024-02-13 | Discharge: 2024-02-13 | Disposition: A | Discharge status: Home / Self Care | Source: Ambulatory Visit | Attending: Urology | Admitting: Urology

## 2024-02-13 DIAGNOSIS — R3129 Other microscopic hematuria: Secondary | ICD-10-CM | POA: Insufficient documentation

## 2024-02-13 MED ORDER — SODIUM CHLORIDE 0.9 % IV SOLN: INTRAVENOUS | Status: DC

## 2024-02-13 MED ORDER — IOHEXOL 300 MG/ML  SOLN
100.0000 mL | Freq: Once | INTRAMUSCULAR | Status: AC | PRN
  Administered 2024-02-13: 100 mL via INTRAVENOUS

## 2024-02-24 ENCOUNTER — Ambulatory Visit (INDEPENDENT_AMBULATORY_CARE_PROVIDER_SITE_OTHER): Admitting: Urology

## 2024-02-24 VITALS — BP 124/77 | HR 65

## 2024-02-24 DIAGNOSIS — N138 Other obstructive and reflux uropathy: Secondary | ICD-10-CM

## 2024-02-24 DIAGNOSIS — R3129 Other microscopic hematuria: Secondary | ICD-10-CM | POA: Diagnosis not present

## 2024-02-24 DIAGNOSIS — N401 Enlarged prostate with lower urinary tract symptoms: Secondary | ICD-10-CM | POA: Diagnosis not present

## 2024-02-24 DIAGNOSIS — R399 Unspecified symptoms and signs involving the genitourinary system: Secondary | ICD-10-CM

## 2024-02-24 MED ORDER — LIDOCAINE HCL URETHRAL/MUCOSAL 2 % EX GEL
1.0000 | Freq: Once | CUTANEOUS | Status: AC
  Administered 2024-02-24: 1 via URETHRAL

## 2024-02-24 NOTE — Patient Instructions (Signed)
 Vist [email or web address] for more information about the procedure  Prostatic Urethral Lift (UROLIFT)  Prostatic urethral lift is a surgical procedure to treat symptoms of prostate gland enlargement that occurs with age (benign prostatic hypertrophy, BPH). The urethra passes between the two lobes of the prostate. The urethra is the part of the body that drains urine from the bladder. As the prostate enlarges, it can push on the urethra and cause problems with urinating. This procedure involves placing an implant that holds the prostate away from the urethra. The procedure is done using a thin device called a cystoscope. The device is inserted through the tip of the penis and moved up the urethra to the prostate. This is less invasive than other procedures that require an incision. You may have this procedure if: You have symptoms of BPH. Your prostate is not severely enlarged. Medicines to treat BPH are not working or not tolerated. You want to avoid possible sexual side effects from medicines or other procedures that are used to treat BPH. Tell a health care provider about: Any allergies you have. All medicines you are taking, including vitamins, herbs, eye drops, creams, and over-the-counter medicines. Any problems you or family members have had with anesthetic medicines. Any bleeding problems you have. Any surgeries you have had. Any medical conditions you have. What are the risks? Generally, this is a safe procedure. However, problems may occur, including: Bleeding. Infection. Leaking of urine (incontinence). Allergic reactions to medicines. Return of BPH symptoms after 2 years, requiring more treatment. What happens before the procedure? When to stop eating and drinking Follow instructions from your health care provider about what you may eat and drink before your procedure. These may include: 8 hours before your procedure Stop eating most foods. Do not eat meat, fried foods, or fatty  foods. Eat only light foods, such as toast or crackers. All liquids are okay except energy drinks and alcohol. 6 hours before your procedure Stop eating. Drink only clear liquids, such as water , clear fruit juice, black coffee, plain tea, and sports drinks. Do not drink energy drinks or alcohol. 2 hours before your procedure Stop drinking all liquids. You may be allowed to take medicines with small sips of water . If you do not follow your health care provider's instructions, your procedure may be delayed or canceled. Medicines Ask your health care provider about: Changing or stopping your regular medicines. This is especially important if you are taking diabetes medicines or blood thinners. Taking medicines such as aspirin and ibuprofen . These medicines can thin your blood. Do not take these medicines unless your health care provider tells you to take them. Taking over-the-counter medicines, vitamins, herbs, and supplements. Surgery safety Ask your health care provider what steps will be taken to help prevent infection. These steps may include: Removing hair at the surgery site. Washing skin with a germ-killing soap. Taking antibiotic medicine. General instructions Do not use any products that contain nicotine or tobacco for at least 4 weeks before the procedure. These products include cigarettes, chewing tobacco, and vaping devices, such as e-cigarettes. If you need help quitting, ask your health care provider. If you will be going home right after the procedure, plan to have a responsible adult: Take you home from the hospital or clinic. You will not be allowed to drive. Care for you for the time you are told. What happens during the procedure? An IV may be inserted into one of your veins. You will be given one or more of the following:  A medicine to help you relax (sedative). A medicine that is injected into your urethra to numb the area (local anesthetic). A medicine to make you  fall asleep (general anesthetic). A cystoscope will be inserted into your penis and moved through your urethra to your prostate. A device will be inserted through the cystoscope and used to press the lobes of your prostate away from your urethra. Implants will be inserted through the device to hold the lobes of your prostate in the widened position. The device and cystoscope will be removed. The procedure may vary among health care providers and hospitals. What happens after the procedure? Your blood pressure, heart rate, breathing rate, and blood oxygen level be monitored until you leave the hospital or clinic. If you were given a sedative during the procedure, it can affect you for several hours. Do not drive or operate machinery until your health care provider says that it is safe. Summary Prostatic urethral lift is a surgical procedure to relieve symptoms of prostate gland enlargement that occurs with age (benign prostatic hypertrophy, BPH). The procedure is performed with a thin device called a cystoscope. This device is inserted through the tip of the penis and moved up the urethra to reach the prostate. This is less invasive than other procedures that require an incision. If you will be going home right after the procedure, plan to have a responsible adult take you home from the hospital or clinic. You will not be allowed to drive. This information is not intended to replace advice given to you by your health care provider. Make sure you discuss any questions you have with your health care provider. Document Revised: 04/27/2021 Document Reviewed: 04/27/2021 Elsevier Patient Education  2024 [employer].

## 2024-02-24 NOTE — Progress Notes (Signed)
 Cystoscopy Procedure Note:  Indication: Microscopic hematuria, obstructive urinary symptoms  After informed consent and discussion of the procedure and its risks, Douglas Abbott. was positioned and prepped in the standard fashion. Cystoscopy was performed with a flexible cystoscope. The urethra, bladder neck and entire bladder was visualized in a standard fashion. The prostate was small, short, but tight with lateral lobe hypertrophy. The ureteral orifices were visualized in their normal location and orientation.  Bladder mucosa grossly normal throughout, no abnormalities on retroflexion, no median lobe  Imaging: I personally viewed and interpreted the CT urogram, no evidence of stones, filling defects, or masses on my review, prostate measures 30 g  Findings: Short prostate with obstructing lateral lobes  ----------------------------------------------------------------------------  Assessment and Plan: 58 year old male with microscopic hematuria and obstructive urinary symptoms despite max dose Flomax .  He is interested in alternative treatment options.  Cystoscopy today shows short prostate with lateral lobe hypertrophy, prostate volume 30 g on CT with no other worrisome findings on my review.  We discussed treatment options including UroLift or HOLEP.  Risks and benefits were reviewed extensively.  He would like to maintain ejaculatory function and would like to pursue UroLift.  Risk of bleeding, infection, recurrence of symptoms in 5 to 10 years potentially requiring additional treatments were discussed thoroughly.  Recent PSA normal at 2.6.  Schedule UroLift Follow up final report on CT  Jay Meth, MD 02/24/2024

## 2024-02-25 ENCOUNTER — Other Ambulatory Visit: Payer: Self-pay

## 2024-02-25 DIAGNOSIS — N138 Other obstructive and reflux uropathy: Secondary | ICD-10-CM

## 2024-02-25 NOTE — Progress Notes (Unsigned)
 Surgical Physician Order Form [facility]   Dr. Darlynn Elam, MD  * Scheduling expectation : Next Available  *Length of Case: 30 minutes  *Clearance needed: no  *Anticoagulation Instructions: Hold all anticoagulants  *Aspirin Instructions: Hold Aspirin  *Post-op visit Date/Instructions:  1 month follow up MD PVR  *Diagnosis: BPH w/urinary obstruction  *Procedure: UroLift   Additional orders: N/A  -Admit type: OUTpatient  -Anesthesia: General  -VTE Prophylaxis Standing Order SCD's       Other:   -Standing Lab Orders Per Anesthesia    Lab other: UA&Urine Culture  -Standing Test orders EKG/Chest x-ray per Anesthesia       Test other:   - Medications:  Ancef 2gm IV  -Other orders:  N/A

## 2024-02-26 ENCOUNTER — Telehealth: Payer: Self-pay

## 2024-02-26 NOTE — Telephone Encounter (Signed)
 Per Dr. Estanislao Heimlich, Patient is to be scheduled for Cystoscopy with Insertion of Urolift   Mr. Douglas Abbott was contacted and possible surgical dates were discussed, Friday May 30th, 2025 was agreed upon for surgery.   Patient was instructed that Dr. Estanislao Heimlich will require them to provide a pre-op UA & CX prior to surgery. This was ordered and scheduled drop off appointment was made for 02/27/2024.    Patient was directed to call [phone] between 1-3pm the day before surgery to find out surgical arrival time.  Instructions were given not to eat or drink from midnight on the night before surgery and have a driver for the day of surgery. On the surgery day patient was instructed to enter through the Medical Mall entrance of [facility] report the Same Day Surgery desk.   Pre-Admit Testing will be in contact via phone to set up an interview with the anesthesia team to review your history and medications prior to surgery.   Reminder of this information was sent via MyChart to the patient.

## 2024-02-26 NOTE — Progress Notes (Signed)
   [facility] Surgical Posting Form  Surgery Date: Date: 03/12/2024  Surgeon: Dr. Jay Meth, MD  Inpt ( No  )   Outpt (Yes)   Obs ( No  )   Diagnosis: N40.1, N13.8 Benign Prostatic Hyperplasia with Urinary Obstruction  -CPT: [phone]  Surgery: Cystoscopy with Insertion of Urolift  Stop Anticoagulations: Yes and also hold ASA  Cardiac/Medical/Pulmonary Clearance needed: no  *Orders entered into EPIC  Date: 02/26/24   *Case booked in [place]  Date: 02/26/24  *Notified pt of Surgery: Date: 02/26/24  PRE-OP UA & CX: yes, will obtain at [facility] [place] on 02/27/2024  *Placed into Prior Authorization Work [place] Date: 02/26/24  Assistant/laser/rep:No

## 2024-02-27 ENCOUNTER — Other Ambulatory Visit
Admission: RE | Admit: 2024-02-27 | Discharge: 2024-02-27 | Disposition: A | Discharge status: Home / Self Care | Attending: Urology | Admitting: Urology

## 2024-02-27 DIAGNOSIS — N138 Other obstructive and reflux uropathy: Secondary | ICD-10-CM | POA: Diagnosis present

## 2024-02-27 DIAGNOSIS — N401 Enlarged prostate with lower urinary tract symptoms: Secondary | ICD-10-CM | POA: Diagnosis present

## 2024-02-27 LAB — URINALYSIS, COMPLETE (UACMP) WITH MICROSCOPIC
Bilirubin Urine: NEGATIVE
Glucose, UA: NEGATIVE mg/dL
Ketones, ur: NEGATIVE mg/dL
Leukocytes,Ua: NEGATIVE
Nitrite: NEGATIVE
Protein, ur: NEGATIVE mg/dL
Specific Gravity, Urine: >1.03 — ABNORMAL HIGH (ref 1.005–1.030)
pH: 5.5 (ref 5.0–8.0)

## 2024-02-28 LAB — URINE CULTURE: Culture: NO GROWTH

## 2024-03-09 ENCOUNTER — Telehealth: Payer: Self-pay

## 2024-03-09 NOTE — Telephone Encounter (Signed)
 Patient called after hours on 5/26 regarding medication questions for upcoming procedure. Advised patient to follow surgeon's instructions for what medications to discontinue.

## 2024-03-11 ENCOUNTER — Other Ambulatory Visit: Payer: Self-pay

## 2024-03-11 ENCOUNTER — Encounter
Admission: RE | Admit: 2024-03-11 | Discharge: 2024-03-11 | Disposition: A | Discharge status: Home / Self Care | Source: Ambulatory Visit | Attending: Urology | Admitting: Urology

## 2024-03-11 HISTORY — DX: Personal history of colon polyps, unspecified: Z86.0100

## 2024-03-11 HISTORY — DX: Other obstructive and reflux uropathy: N40.1

## 2024-03-11 HISTORY — DX: Unspecified hemorrhoids: K64.9

## 2024-03-11 HISTORY — DX: Chronic obstructive pulmonary disease, unspecified: J44.9

## 2024-03-11 HISTORY — DX: Vitamin D deficiency, unspecified: E55.9

## 2024-03-11 HISTORY — DX: Unspecified symptoms and signs involving the genitourinary system: R39.9

## 2024-03-11 HISTORY — DX: Other microscopic hematuria: R31.29

## 2024-03-11 HISTORY — DX: Nasal polyp, unspecified: J33.9

## 2024-03-11 HISTORY — DX: Gastro-esophageal reflux disease without esophagitis: K21.9

## 2024-03-11 NOTE — Patient Instructions (Signed)
 Your procedure is scheduled on:03-12-24 Friday Report to the Registration Desk on the 1st floor of the Medical Mall.Then proceed to the 2nd floor Surgery Desk To find out your arrival time, please call [phone] between 1PM - 3PM on:03-11-24 Thursday If your arrival time is 6:00 am, do not arrive before that time as the Medical Mall entrance doors do not open until 6:00 am.  REMEMBER: Instructions that are not followed completely may result in serious medical risk, up to and including death; or upon the discretion of your surgeon and anesthesiologist your surgery may need to be rescheduled.  Do not eat food OR drink any liquids after midnight the night before surgery.  No gum chewing or hard candies.  One week prior to surgery:Stop NOW (03-11-24) Stop Anti-inflammatories (NSAIDS) such as Advil , Aleve , Ibuprofen , Motrin , Naproxen , Naprosyn  and Aspirin based products such as Excedrin, Goody's Powder, BC Powder. Stop ANY OVER THE COUNTER supplements until after surgery.  You may however, continue to take Tylenol  if needed for pain up until the day of surgery.  Stop sildenafil  (VIAGRA ) NOW (03-11-24) Do NOT take again until AFTER surgery  Continue taking all of your other prescription medications up until the day of surgery.  ON THE DAY OF SURGERY ONLY TAKE THESE MEDICATIONS WITH SIPS OF WATER : -omeprazole  (PRILOSEC)  -tamsulosin  (FLOMAX )   Use your Albuterol  and Symbicort  Inhaler the morning of surgery and bring your Albuterol  Inhaler to the hospital  No Alcohol for 24 hours before or after surgery.  No Smoking including e-cigarettes for 24 hours before surgery.  No chewable tobacco products for at least 6 hours before surgery.  No nicotine patches on the day of surgery.  Do not use any "recreational" drugs for at least a week (preferably 2 weeks) before your surgery.  Please be advised that the combination of cocaine and anesthesia may have negative outcomes, up to and including  death. If you test positive for cocaine, your surgery will be cancelled.  On the morning of surgery brush your teeth with toothpaste and water , you may rinse your mouth with mouthwash if you wish. Do not swallow any toothpaste or mouthwash.  Do not wear jewelry, make-up, hairpins, clips or nail polish.  For welded (permanent) jewelry: bracelets, anklets, waist bands, etc.  Please have this removed prior to surgery.  If it is not removed, there is a chance that hospital personnel will need to cut it off on the day of surgery.  Do not wear lotions, powders, or perfumes.   Do not shave body hair from the neck down 48 hours before surgery.  Contact lenses, hearing aids and dentures may not be worn into surgery.  Do not bring valuables to the hospital. [facility] is not responsible for any missing/lost belongings or valuables.   Notify your doctor if there is any change in your medical condition (cold, fever, infection).  Wear comfortable clothing (specific to your surgery type) to the hospital.  After surgery, you can help prevent lung complications by doing breathing exercises.  Take deep breaths and cough every 1-2 hours. Your doctor may order a device called an Incentive Spirometer to help you take deep breaths. When coughing or sneezing, hold a pillow firmly against your incision with both hands. This is called "splinting." Doing this helps protect your incision. It also decreases belly discomfort.  If you are being admitted to the hospital overnight, leave your suitcase in the car. After surgery it may be brought to your room.  In  case of increased patient census, it may be necessary for you, the patient, to continue your postoperative care in the Same Day Surgery department.  If you are being discharged the day of surgery, you will not be allowed to drive home. You will need a responsible individual to drive you home and stay with you for 24 hours after surgery.   If you are  taking public transportation, you will need to have a responsible individual with you.  Please call the Pre-admissions Testing Dept. at [phone] if you have any questions about these instructions.  Surgery Visitation Policy:  Patients having surgery or a procedure may have two visitors.  Children under the age of 46 must have an adult with them who is not the patient.

## 2024-03-12 ENCOUNTER — Ambulatory Visit
Admission: RE | Admit: 2024-03-12 | Discharge: 2024-03-12 | Disposition: A | Discharge status: Home / Self Care | Attending: Urology | Admitting: Urology

## 2024-03-12 ENCOUNTER — Encounter: Payer: Self-pay | Admitting: Urology

## 2024-03-12 ENCOUNTER — Ambulatory Visit: Admitting: Anesthesiology

## 2024-03-12 ENCOUNTER — Encounter
Admission: RE | Disposition: A | Discharge status: Home / Self Care | Payer: Self-pay | Source: Home / Self Care | Attending: Urology

## 2024-03-12 DIAGNOSIS — J45909 Unspecified asthma, uncomplicated: Secondary | ICD-10-CM | POA: Insufficient documentation

## 2024-03-12 DIAGNOSIS — K219 Gastro-esophageal reflux disease without esophagitis: Secondary | ICD-10-CM | POA: Insufficient documentation

## 2024-03-12 DIAGNOSIS — N138 Other obstructive and reflux uropathy: Secondary | ICD-10-CM | POA: Diagnosis present

## 2024-03-12 DIAGNOSIS — Z87891 Personal history of nicotine dependence: Secondary | ICD-10-CM | POA: Diagnosis not present

## 2024-03-12 DIAGNOSIS — N401 Enlarged prostate with lower urinary tract symptoms: Secondary | ICD-10-CM | POA: Diagnosis present

## 2024-03-12 HISTORY — PX: CYSTOSCOPY WITH INSERTION OF UROLIFT: SHX6678

## 2024-03-12 SURGERY — CYSTOSCOPY WITH INSERTION OF UROLIFT
Anesthesia: General | Site: Prostate

## 2024-03-12 MED ORDER — OXYCODONE HCL 5 MG PO TABS: 5.0000 mg | ORAL_TABLET | Freq: Once | ORAL | Status: DC | PRN

## 2024-03-12 MED ORDER — MIDAZOLAM HCL 2 MG/2ML IJ SOLN
INTRAMUSCULAR | Status: DC | PRN
Start: 2024-03-12 — End: 2024-03-12
  Administered 2024-03-12: 2 mg via INTRAVENOUS

## 2024-03-12 MED ORDER — ONDANSETRON HCL 4 MG/2ML IJ SOLN: 4.0000 mg | Freq: Once | INTRAMUSCULAR | Status: DC | PRN

## 2024-03-12 MED ORDER — CEFAZOLIN SODIUM-DEXTROSE 2-4 GM/100ML-% IV SOLN: 2.0000 g | INTRAVENOUS | Status: DC

## 2024-03-12 MED ORDER — CEFAZOLIN SODIUM-DEXTROSE 2-4 GM/100ML-% IV SOLN
INTRAVENOUS | Status: AC
  Filled 2024-03-12: qty 100

## 2024-03-12 MED ORDER — FENTANYL CITRATE (PF) 100 MCG/2ML IJ SOLN: 25.0000 ug | INTRAMUSCULAR | Status: DC | PRN

## 2024-03-12 MED ORDER — FENTANYL CITRATE (PF) 100 MCG/2ML IJ SOLN
INTRAMUSCULAR | Status: AC
  Filled 2024-03-12: qty 2

## 2024-03-12 MED ORDER — CHLORHEXIDINE GLUCONATE 0.12 % MT SOLN
OROMUCOSAL | Status: AC
  Filled 2024-03-12: qty 15

## 2024-03-12 MED ORDER — DEXAMETHASONE SODIUM PHOSPHATE 10 MG/ML IJ SOLN
INTRAMUSCULAR | Status: DC | PRN
Start: 2024-03-12 — End: 2024-03-12
  Administered 2024-03-12: 10 mg via INTRAVENOUS

## 2024-03-12 MED ORDER — GLYCOPYRROLATE 0.2 MG/ML IJ SOLN
INTRAMUSCULAR | Status: DC | PRN
  Administered 2024-03-12: .2 mg via INTRAVENOUS

## 2024-03-12 MED ORDER — ACETAMINOPHEN 10 MG/ML IV SOLN
INTRAVENOUS | Status: DC | PRN
Start: 2024-03-12 — End: 2024-03-12
  Administered 2024-03-12: 1000 mg via INTRAVENOUS

## 2024-03-12 MED ORDER — MIDAZOLAM HCL 2 MG/2ML IJ SOLN
INTRAMUSCULAR | Status: AC
Start: 2024-03-12 — End: ?
  Filled 2024-03-12: qty 2

## 2024-03-12 MED ORDER — PROPOFOL 10 MG/ML IV BOLUS
INTRAVENOUS | Status: DC | PRN
Start: 2024-03-12 — End: 2024-03-12
  Administered 2024-03-12: 200 mg via INTRAVENOUS

## 2024-03-12 MED ORDER — LIDOCAINE HCL (CARDIAC) PF 100 MG/5ML IV SOSY
PREFILLED_SYRINGE | INTRAVENOUS | Status: DC | PRN
  Administered 2024-03-12: 100 mg via INTRAVENOUS

## 2024-03-12 MED ORDER — LACTATED RINGERS IV SOLN: INTRAVENOUS | Status: DC | PRN

## 2024-03-12 MED ORDER — ORAL CARE MOUTH RINSE: 15.0000 mL | Freq: Once | OROMUCOSAL | Status: AC

## 2024-03-12 MED ORDER — LACTATED RINGERS IV SOLN: INTRAVENOUS | Status: DC

## 2024-03-12 MED ORDER — CHLORHEXIDINE GLUCONATE 0.12 % MT SOLN
15.0000 mL | Freq: Once | OROMUCOSAL | Status: AC
  Administered 2024-03-12: 15 mL via OROMUCOSAL

## 2024-03-12 MED ORDER — PROPOFOL 1000 MG/100ML IV EMUL
INTRAVENOUS | Status: AC
  Filled 2024-03-12: qty 200

## 2024-03-12 MED ORDER — FENTANYL CITRATE (PF) 100 MCG/2ML IJ SOLN
INTRAMUSCULAR | Status: DC | PRN
  Administered 2024-03-12: 50 ug via INTRAVENOUS

## 2024-03-12 MED ORDER — SODIUM CHLORIDE 0.9 % IR SOLN
Status: DC | PRN
  Administered 2024-03-12: 3000 mL

## 2024-03-12 MED ORDER — ACETAMINOPHEN 10 MG/ML IV SOLN
INTRAVENOUS | Status: AC
  Filled 2024-03-12: qty 100

## 2024-03-12 MED ORDER — OXYCODONE HCL 5 MG/5ML PO SOLN: 5.0000 mg | Freq: Once | ORAL | Status: DC | PRN

## 2024-03-12 MED ORDER — ONDANSETRON HCL 4 MG/2ML IJ SOLN
INTRAMUSCULAR | Status: DC | PRN
  Administered 2024-03-12: 4 mg via INTRAVENOUS

## 2024-03-12 MED ORDER — ACETAMINOPHEN 10 MG/ML IV SOLN: 1000.0000 mg | Freq: Once | INTRAVENOUS | Status: DC | PRN

## 2024-03-12 SURGICAL SUPPLY — 14 items
BAG DRAIN SIEMENS DORNER NS (MISCELLANEOUS) ×1 IMPLANT
BRUSH SCRUB EZ 4% CHG (MISCELLANEOUS) ×1 IMPLANT
GLOVE BIOGEL PI IND STRL 7.5 (GLOVE) ×1 IMPLANT
GOWN STRL REUS W/ TWL LRG LVL3 (GOWN DISPOSABLE) ×1 IMPLANT
GOWN STRL REUS W/ TWL XL LVL3 (GOWN DISPOSABLE) ×1 IMPLANT
KIT TURNOVER CYSTO (KITS) ×1 IMPLANT
PACK CYSTO AR (MISCELLANEOUS) ×1 IMPLANT
SET CYSTO W/LG BORE CLAMP LF (SET/KITS/TRAYS/PACK) ×1 IMPLANT
SOL .9 NS 3000ML IRR UROMATIC (IV SOLUTION) ×1 IMPLANT
SURGILUBE 2OZ TUBE FLIPTOP (MISCELLANEOUS) IMPLANT
SYSTEM UROLIFT 2 CART W/ HNDL (Male Continence) ×1 IMPLANT
SYSTEM UROLIFT 2 CARTRIDGE (Male Continence) IMPLANT
WATER STERILE IRR 1000ML POUR (IV SOLUTION) ×1 IMPLANT
WATER STERILE IRR 500ML POUR (IV SOLUTION) ×1 IMPLANT

## 2024-03-12 NOTE — Transfer of Care (Signed)
 Immediate Anesthesia Transfer of Care Note  Patient: Douglas Abbott.  Procedure(s) Performed: CYSTOSCOPY WITH INSERTION OF UROLIFT (Prostate)  Patient Location: PACU  Anesthesia Type:General  Level of Consciousness: drowsy and patient cooperative  Airway & Oxygen Therapy: Patient Spontanous Breathing and Patient connected to face mask oxygen  Post-op Assessment: Report given to RN and Post -op Vital signs reviewed and unstable, Anesthesiologist notified  Post vital signs: Reviewed and stable  Last Vitals:  Vitals Value Taken Time  BP 116/74 03/12/24 0749  Temp    Pulse 74 03/12/24 0750  Resp 19 03/12/24 0750  SpO2 100 % 03/12/24 0750  Vitals shown include unfiled device data.  Last Pain:  Vitals:   03/12/24 0620  PainSc: 0-No pain         Complications: No notable events documented.

## 2024-03-12 NOTE — Op Note (Signed)
 Date of procedure: 03/12/24  Preoperative diagnosis:  BPH with obstruction  Postoperative diagnosis:  Same  Procedure: UroLift(4 implants)  Surgeon: Jay Meth, MD  Anesthesia: General  Complications: None  Intraoperative findings:  Short prostate with obstructing lateral lobes, mild bladder neck elevation, normal bladder with mild to moderate trabeculations, no suspicious lesions Excellent anterior channel after UroLift  EBL: Minimal  Specimens: None  Drains: None  Indication: Wasim L Burhanuddin Kohlmann. is a 58 y.o. patient with obstructive urinary symptoms despite Flomax  who was interested in outlet procedures and maintaining antegrade ejaculation and opted for UroLift.  After reviewing the management options for treatment, they elected to proceed with the above surgical procedure(s). We have discussed the potential benefits and risks of the procedure, side effects of the proposed treatment, the likelihood of the patient achieving the goals of the procedure, and any potential problems that might occur during the procedure or recuperation. Informed consent has been obtained.  Description of procedure:  The patient was taken to the operating room and general anesthesia was induced. SCDs were placed for DVT prophylaxis.. The patient was placed in the dorsal lithotomy position, prepped and draped in the usual sterile fashion, and preoperative antibiotics(Ancef) were administered. A preoperative time-out was performed.   The UroLift scope with visual obturator was inserted into the urethra and a normal-appearing urethra followed proximally to the bladder.  The prostate was short with some obstructive lateral lobe tissue, and mild bladder neck elevation.  Thorough cystoscopy showed that the ureteral orifices were orthotopic bilaterally, and there were mild to moderate trabeculations but no suspicious lesions.  I started by placing a UroLift implant at the left anterior lateral lobe 1 cm  distal to the bladder neck.  An identical implant was placed on the right side.  I then reinserted the scope with the visual obturator and this appeared to open the anterior channel, but there was some residual tissue just proximal to the verumontanum.  The UroLift device was used to place another implant on the left side just lateral and anterior to the verumontanum, an identical implant placed on the right side.  I reinserted the scope with the visual obturator and there was an excellent anterior channel and the prostatic fossa was wide open.  There is no evidence of any bleeding.  The bladder was left partially filled, and he will need to void prior to discharge.  Disposition: Stable to PACU  Plan: Void prior to discharge RTC in clinic with IPSS, PVR 4 to 6 weeks  Jay Meth, MD

## 2024-03-12 NOTE — Anesthesia Procedure Notes (Signed)
 Procedure Name: LMA Insertion Date/Time: 03/12/2024 7:32 AM  Performed by: Niki Barter, CRNAPre-anesthesia Checklist: Patient identified, Emergency Drugs available, Suction available and Patient being monitored Patient Re-evaluated:Patient Re-evaluated prior to induction Oxygen Delivery Method: Circle system utilized Preoxygenation: Pre-oxygenation with 100% oxygen Induction Type: IV induction Ventilation: Mask ventilation without difficulty LMA: LMA inserted LMA Size: 4.0 Placement Confirmation: positive ETCO2 and breath sounds checked- equal and bilateral Tube secured with: Tape Dental Injury: Teeth and Oropharynx as per pre-operative assessment

## 2024-03-12 NOTE — Anesthesia Preprocedure Evaluation (Signed)
 Anesthesia Evaluation  Patient identified by MRN, date of birth, ID band Patient awake    Reviewed: Allergy  & Precautions, NPO status , Patient's Chart, lab work & pertinent test results  History of Anesthesia Complications Negative for: history of anesthetic complications  Airway Mallampati: II  TM Distance: >3 FB Neck ROM: Full    Dental no notable dental hx. (+) Teeth Intact   Pulmonary asthma , neg sleep apnea, neg COPD, Patient abstained from smoking.Not current smoker, former smoker Well controlled asthma, took symbicort  and albuterol  today.   Pulmonary exam normal breath sounds clear to auscultation       Cardiovascular Exercise Tolerance: Good METS(-) hypertension(-) CAD and (-) Past MI negative cardio ROS (-) dysrhythmias  Rhythm:Regular Rate:Normal - Systolic murmurs    Neuro/Psych negative neurological ROS  negative psych ROS   GI/Hepatic ,GERD  Controlled,,(+)     (-) substance abuse    Endo/Other  neg diabetes    Renal/GU negative Renal ROS     Musculoskeletal   Abdominal   Peds  Hematology   Anesthesia Other Findings Past Medical History: No date: Allergic rhinitis No date: Allergy  No date: Asthma No date: BPH with urinary obstruction 03/27/2018: BRBPR (bright red blood per rectum) No date: COPD (chronic obstructive pulmonary disease) (HCC) No date: Decreased libido No date: Dysrhythmia No date: GERD (gastroesophageal reflux disease) No date: Hemorrhoids No date: History of colon polyps 2015: History of methicillin resistant staphylococcus aureus (MRSA) No date: History of pleurisy No date: Lower urinary tract symptoms (LUTS) No date: Microscopic hematuria No date: Nasal polyps No date: Sinus bradycardia     Comment:  a. 03/2017 Echo: Ef 50-55%. No date: Vitamin D  deficiency  Reproductive/Obstetrics                             Anesthesia Physical Anesthesia  Plan  ASA: 2  Anesthesia Plan: General   Post-op Pain Management: Ofirmev  IV (intra-op)*   Induction: Intravenous  PONV Risk Score and Plan: 3 and Ondansetron, Dexamethasone  and Midazolam  Airway Management Planned: LMA  Additional Equipment: None  Intra-op Plan:   Post-operative Plan: Extubation in OR  Informed Consent: I have reviewed the patients History and Physical, chart, labs and discussed the procedure including the risks, benefits and alternatives for the proposed anesthesia with the patient or authorized representative who has indicated his/her understanding and acceptance.     Dental advisory given  Plan Discussed with: CRNA and Surgeon  Anesthesia Plan Comments: (Discussed risks of anesthesia with patient, including PONV, sore throat, lip/dental/eye damage. Rare risks discussed as well, such as cardiorespiratory and neurological sequelae, and allergic reactions. Discussed the role of CRNA in patient's perioperative care. Patient understands.)       Anesthesia Quick Evaluation

## 2024-03-12 NOTE — Anesthesia Postprocedure Evaluation (Signed)
 Anesthesia Post Note  Patient: Douglas Abbott.  Procedure(s) Performed: CYSTOSCOPY WITH INSERTION OF UROLIFT (Prostate)  Patient location during evaluation: PACU Anesthesia Type: General Level of consciousness: awake and alert Pain management: pain level controlled Vital Signs Assessment: post-procedure vital signs reviewed and stable Respiratory status: spontaneous breathing, nonlabored ventilation, respiratory function stable and patient connected to nasal cannula oxygen Cardiovascular status: blood pressure returned to baseline and stable Postop Assessment: no apparent nausea or vomiting Anesthetic complications: no   No notable events documented.   Last Vitals:  Vitals:   03/12/24 0830 03/12/24 0845  BP: (!) 131/91   Pulse: 75 (!) 55  Resp: 15 13  Temp:  (!) 36.3 C  SpO2: 98% 100%    Last Pain:  Vitals:   03/12/24 0845  PainSc: 0-No pain                 Lattie Poli

## 2024-03-12 NOTE — H&P (Signed)
   03/12/24 6:57 AM   Douglas Abbott. 07/12/1966 [id number]  CC: BPH  HPI: 58 year old male with obstructive urinary symptoms, prostate measures 30 g, he is interested in UroLift to improve voiding function.  Failed trial of max dose Flomax , wants to maintain antegrade ejaculation.  PMH: Past Medical History:  Diagnosis Date   Allergic rhinitis    Allergy     Asthma    BPH with urinary obstruction    BRBPR (bright red blood per rectum) 03/27/2018   COPD (chronic obstructive pulmonary disease) (HCC)    Decreased libido    Dysrhythmia    GERD (gastroesophageal reflux disease)    Hemorrhoids    History of colon polyps    History of methicillin resistant staphylococcus aureus (MRSA) 2015   History of pleurisy    Lower urinary tract symptoms (LUTS)    Microscopic hematuria    Nasal polyps    Sinus bradycardia    a. 03/2017 Echo: Ef 50-55%.   Vitamin D  deficiency     Surgical History: Past Surgical History:  Procedure Laterality Date   COLONOSCOPY WITH PROPOFOL  N/A 05/04/2018   Procedure: COLONOSCOPY WITH PROPOFOL ;  Surgeon: Luke Salaam, MD;  Location: [facility] ENDOSCOPY;  Service: Gastroenterology;  Laterality: N/A;   COLONOSCOPY WITH PROPOFOL  N/A 05/21/2023   Procedure: COLONOSCOPY WITH PROPOFOL ;  Surgeon: Selena Daily, MD;  Location: [facility] ENDOSCOPY;  Service: Gastroenterology;  Laterality: N/A;   ESOPHAGOGASTRODUODENOSCOPY (EGD) WITH PROPOFOL  N/A 04/10/2022   Procedure: ESOPHAGOGASTRODUODENOSCOPY (EGD) WITH PROPOFOL ;  Surgeon: Selena Daily, MD;  Location: [facility] ENDOSCOPY;  Service: Gastroenterology;  Laterality: N/A;    Family History: Family History  Problem Relation Age of Onset   Asthma Mother    Lung cancer Mother    Kidney disease Mother    Heart disease Neg Hx     Social History:  reports that he quit smoking about 25 years ago. His smoking use included cigarettes. He started smoking about 33 years ago. He has a 8 pack-year smoking history. He has  never used smokeless tobacco. He reports current alcohol use. He reports that he does not currently use drugs after having used the following drugs: Marijuana. Frequency: 1.00 time per week.  Physical Exam: BP (!) 150/94   Pulse 63   Temp (!) 97.4 F (36.3 C)   Resp 16   Ht 6\' 2"  (1.88 m)   Wt 76.7 kg   SpO2 100%   BMI 21.70 kg/m    Constitutional:  Alert and oriented, No acute distress. Cardiovascular: Regular rate and rhythm Respiratory: Clear to auscultation bilaterally GI: Abdomen is soft, nontender, nondistended, no abdominal masses   Laboratory Data: Urine culture 5/16 no growth  Pertinent Imaging: I have personally viewed and interpreted the CT urogram, prostate measures 30 g.  I reviewed the report suggesting possible right ureteral stricture, however this is more consistent with peristalsis or ureteritis cystica  Assessment & Plan:   58 year old male with 30 g prostate and obstructive urinary symptoms, cystoscopy showing short, tight, obstructive appearing prostate.  Risk, benefits, and alternatives discussed and he ultimately opted for UroLift.  Risk of bleeding, infection, recurrence of symptoms, clip erosion, dysuria, urgency/frequency, gross hematuria, retention reviewed.  UroLift today  Jay Meth, MD 03/12/2024  [facility] Urology [address] [place], [place] [address] [phone]

## 2024-03-15 ENCOUNTER — Encounter: Payer: Self-pay | Admitting: Urology

## 2024-03-17 ENCOUNTER — Other Ambulatory Visit: Payer: Self-pay | Admitting: Nurse Practitioner

## 2024-03-17 DIAGNOSIS — J0140 Acute pansinusitis, unspecified: Secondary | ICD-10-CM

## 2024-03-18 NOTE — Telephone Encounter (Signed)
 Requested Prescriptions  Pending Prescriptions Disp Refills   ALLEGRA-D ALLERGY  & CONGESTION 60-120 MG 12 hr tablet [Pharmacy Med Name: ALLEGRA-D 12HR TABLETS 30'S (OTC)] 60 tablet 0    Sig: TAKE 1 TABLET BY MOUTH TWICE DAILY     Not Delegated - Ear, Nose, and Throat:  Combination Products with Pseudoephedrine Failed - 03/18/2024  3:05 PM      Failed - This refill cannot be delegated      Failed - Valid encounter within last 12 months    Recent Outpatient Visits           2 months ago Frequency of urination   [facility] Quinton Buckler, FNP       Future Appointments             In 1 week Abram Hoguet Monalisa Angles, FNP [facility], [facility]   In 3 weeks Lawerence Pressman, MD [facility] [place]            Passed - Last BP in normal range    BP Readings from Last 1 Encounters:  03/12/24 131/84

## 2024-03-25 ENCOUNTER — Ambulatory Visit
Admission: RE | Admit: 2024-03-25 | Discharge: 2024-03-25 | Disposition: A | Discharge status: Home / Self Care | Attending: Nurse Practitioner | Admitting: Nurse Practitioner

## 2024-03-25 ENCOUNTER — Ambulatory Visit: Payer: Self-pay | Admitting: Nurse Practitioner

## 2024-03-25 ENCOUNTER — Encounter: Payer: Self-pay | Admitting: Nurse Practitioner

## 2024-03-25 ENCOUNTER — Ambulatory Visit
Admission: RE | Admit: 2024-03-25 | Discharge: 2024-03-25 | Disposition: A | Discharge status: Home / Self Care | Source: Ambulatory Visit | Attending: Nurse Practitioner | Admitting: Nurse Practitioner

## 2024-03-25 VITALS — BP 128/80 | HR 72 | Resp 16 | Ht 74.0 in | Wt 170.9 lb

## 2024-03-25 DIAGNOSIS — Z13220 Encounter for screening for lipoid disorders: Secondary | ICD-10-CM

## 2024-03-25 DIAGNOSIS — J4540 Moderate persistent asthma, uncomplicated: Secondary | ICD-10-CM | POA: Diagnosis not present

## 2024-03-25 DIAGNOSIS — J301 Allergic rhinitis due to pollen: Secondary | ICD-10-CM

## 2024-03-25 DIAGNOSIS — K219 Gastro-esophageal reflux disease without esophagitis: Secondary | ICD-10-CM

## 2024-03-25 DIAGNOSIS — M545 Low back pain, unspecified: Secondary | ICD-10-CM | POA: Diagnosis not present

## 2024-03-25 DIAGNOSIS — M79642 Pain in left hand: Secondary | ICD-10-CM | POA: Diagnosis present

## 2024-03-25 DIAGNOSIS — Z131 Encounter for screening for diabetes mellitus: Secondary | ICD-10-CM

## 2024-03-25 DIAGNOSIS — N529 Male erectile dysfunction, unspecified: Secondary | ICD-10-CM | POA: Diagnosis not present

## 2024-03-25 DIAGNOSIS — G4709 Other insomnia: Secondary | ICD-10-CM

## 2024-03-25 DIAGNOSIS — Z23 Encounter for immunization: Secondary | ICD-10-CM | POA: Diagnosis not present

## 2024-03-25 DIAGNOSIS — E785 Hyperlipidemia, unspecified: Secondary | ICD-10-CM

## 2024-03-25 DIAGNOSIS — Z130 Encounter for screening for diseases of the blood and blood-forming organs and certain disorders involving the immune mechanism: Secondary | ICD-10-CM

## 2024-03-25 LAB — COMPREHENSIVE METABOLIC PANEL WITH GFR
AG Ratio: 1.8 (calc) (ref 1.0–2.5)
ALT: 22 U/L (ref 9–46)
AST: 22 U/L (ref 10–35)
Albumin: 3.9 g/dL (ref 3.6–5.1)
Alkaline phosphatase (APISO): 137 U/L (ref 35–144)
BUN: 20 mg/dL (ref 7–25)
CO2: 29 mmol/L (ref 20–32)
Calcium: 9.4 mg/dL (ref 8.6–10.3)
Chloride: 105 mmol/L (ref 98–110)
Creat: 0.99 mg/dL (ref 0.70–1.30)
Globulin: 2.2 g/dL (ref 1.9–3.7)
Glucose, Bld: 97 mg/dL (ref 65–99)
Potassium: 4.3 mmol/L (ref 3.5–5.3)
Sodium: 140 mmol/L (ref 135–146)
Total Bilirubin: 0.5 mg/dL (ref 0.2–1.2)
Total Protein: 6.1 g/dL (ref 6.1–8.1)
eGFR: 89 mL/min/{1.73_m2} (ref >=60)

## 2024-03-25 LAB — LIPID PANEL
Cholesterol: 196 mg/dL (ref <200)
HDL: 77 mg/dL (ref >=40)
LDL Cholesterol (Calc): 106 mg/dL — ABNORMAL HIGH
Non-HDL Cholesterol (Calc): 119 mg/dL (ref <130)
Total CHOL/HDL Ratio: 2.5 (calc) (ref <5.0)
Triglycerides: 50 mg/dL (ref <150)

## 2024-03-25 LAB — CBC WITH DIFFERENTIAL/PLATELET
Absolute Lymphocytes: 1210 {cells}/uL (ref 850–3900)
Absolute Monocytes: 528 {cells}/uL (ref 200–950)
Basophils Absolute: 38 {cells}/uL (ref 0–200)
Basophils Relative: 0.8 %
Eosinophils Absolute: 278 {cells}/uL (ref 15–500)
Eosinophils Relative: 5.8 %
HCT: 41.5 % (ref 38.5–50.0)
Hemoglobin: 13.3 g/dL (ref 13.2–17.1)
MCH: 29.4 pg (ref 27.0–33.0)
MCHC: 32 g/dL (ref 32.0–36.0)
MCV: 91.8 fL (ref 80.0–100.0)
MPV: 9.4 fL (ref 7.5–12.5)
Monocytes Relative: 11 %
Neutro Abs: 2746 {cells}/uL (ref 1500–7800)
Neutrophils Relative %: 57.2 %
Platelets: 334 10*3/uL (ref 140–400)
RBC: 4.52 10*6/uL (ref 4.20–5.80)
RDW: 14.2 % (ref 11.0–15.0)
Total Lymphocyte: 25.2 %
WBC: 4.8 10*3/uL (ref 3.8–10.8)

## 2024-03-25 LAB — HEMOGLOBIN A1C
Hgb A1c MFr Bld: 5.9 % — ABNORMAL HIGH (ref <5.7)
Mean Plasma Glucose: 123 mg/dL
eAG (mmol/L): 6.8 mmol/L

## 2024-03-25 MED ORDER — TRAZODONE HCL 50 MG PO TABS: 25.0000 mg | ORAL_TABLET | Freq: Every evening | ORAL | 0 refills | Status: DC | PRN

## 2024-03-25 NOTE — Assessment & Plan Note (Signed)
 Continue taking the Omeprazole  and avoiding any food triggers.

## 2024-03-25 NOTE — Progress Notes (Signed)
 BP 128/80   Pulse 72   Resp 16   Ht 6' 2 (1.88 m)   Wt 170 lb 14.4 oz (77.5 kg)   SpO2 99%   BMI 21.94 kg/m    Subjective:    Patient ID: Douglas Pilon., male    DOB: 02/02/1966, 58 y.o.   MRN: [id number]  HPI: Douglas Desai. is a 58 y.o. male here today for his 6 month follow-up.   Lower Back Pain: -Back is feeling better has been using heating pad  Left Hand Pain -Reports closing left hand in door at work 2 months ago.Was swollen but swelling has subsided.  -Trouble making a fist -Tenderness with palpation  -Reports small cyst on top of hand  -Will get X-ray of hand   Allergies: - Taking allegra D and has been very affective   Asthma -Taking albuterol  and Symbicort    GERD -Taking omeprazole  -Reports no recent triggers    BPH -Taking Flomax   -Reports getting Urolift in the last 2 weeks (follows up in July) -Reports no issues since getting UroLift procedure done.   Erectile Dysfunction -Taking Viagra  PRN   Insomnia -Reports trouble relaxing at night -Reports he was using marijuana at night to help him sleep, but reports they are doing drug screenings at work -Reports he is looking for a provider who can prescribe marijuana, but in the meantime would like to try medication. -Will order Trazadone - General Health Maintenance -Complete routine blood work -Interested in getting the pneumonia vaccine              03/25/2024    7:41 AM 09/25/2023    7:44 AM 07/21/2023    2:09 PM  Depression screen PHQ 2/9  Decreased Interest 0 0 0  Down, Depressed, Hopeless 0 0 0  PHQ - 2 Score 0 0 0    Relevant past medical, surgical, family and social history reviewed and updated as indicated. Interim medical history since our last visit reviewed. Allergies and medications reviewed and updated.  Review of Systems  Constitutional: Negative for fever or weight change.  Respiratory: Negative for cough and shortness of breath.   Cardiovascular:  Negative for chest pain or palpitations.  Gastrointestinal: Negative for abdominal pain, no bowel changes.  Musculoskeletal: Reports left hand pain and small cyst on dorsum of hand near wrist Skin: Negative for rash.  Neurological: Negative for dizziness or headache.  No other specific complaints in a complete review of systems (except as listed in HPI above).      Objective:     BP 128/80   Pulse 72   Resp 16   Ht 6' 2 (1.88 m)   Wt 170 lb 14.4 oz (77.5 kg)   SpO2 99%   BMI 21.94 kg/m    Wt Readings from Last 3 Encounters:  03/25/24 170 lb 14.4 oz (77.5 kg)  03/12/24 169 lb (76.7 kg)  03/11/24 174 lb (78.9 kg)    Physical Exam Constitutional:      Appearance: Normal appearance.  HENT:     Head: Normocephalic and atraumatic.   Cardiovascular:     Rate and Rhythm: Normal rate and regular rhythm.  Pulmonary:     Effort: Pulmonary effort is normal.     Breath sounds: Normal breath sounds.   Musculoskeletal:     Left hand: Tenderness present.     Comments: Small cyst on dorsum of Left wrist   Skin:    General: Skin is warm and dry.  Neurological:     General: No focal deficit present.     Mental Status: He is alert and oriented to person, place, and time.   Psychiatric:        Mood and Affect: Mood normal.        Behavior: Behavior normal.      Results for orders placed or performed during the hospital encounter of 02/27/24  Urine culture   Collection Time: 02/27/24  3:32 PM   Specimen: Urine, Clean Catch  Result Value Ref Range   Specimen Description      URINE, CLEAN CATCH Performed at [facility], [address], [place], [place] [address]    Special Requests      NONE Performed at [facility], [address] [place], [place] [address]    Culture      NO GROWTH Performed at [facility], [address] [place], [place] [address]    Report Status 02/28/2024 FINAL   Urinalysis, Complete w Microscopic -    Collection Time: 02/27/24  3:32 PM  Result Value Ref Range   Color, Urine YELLOW YELLOW   APPearance CLEAR CLEAR   Specific Gravity, Urine >1.030 (H) 1.005 - 1.030   pH 5.5 5.0 - 8.0   Glucose, UA NEGATIVE NEGATIVE mg/dL   Hgb urine dipstick SMALL (A) NEGATIVE   Bilirubin Urine NEGATIVE NEGATIVE   Ketones, ur NEGATIVE NEGATIVE mg/dL   Protein, ur NEGATIVE NEGATIVE mg/dL   Nitrite NEGATIVE NEGATIVE   Leukocytes,Ua NEGATIVE NEGATIVE   Squamous Epithelial / HPF 0-5 0 - 5 /HPF   WBC, UA 0-5 0 - 5 WBC/hpf   RBC / HPF 6-10 0 - 5 RBC/hpf   Bacteria, UA FEW (A) NONE SEEN   Mucus PRESENT           Assessment & Plan:   Problem List Items Addressed This Visit       Respiratory   Asthma, chronic (Chronic)   Continue taking Albuterol  and Symbicort  and avoiding anyway triggers.       Allergic rhinitis due to pollen   Continue taking Allegra D and avoiding any triggers.         Digestive   GERD (gastroesophageal reflux disease) (Chronic)   Continue taking the Omeprazole  and avoiding any food triggers.       Other Visit Diagnoses       Immunization due    -  Primary   Relevant Orders   Pneumococcal conjugate vaccine 20-valent (Completed)     Acute bilateral low back pain without sciatica       Improved. Doing at home care- rest and heating pad.     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 Lifestyle modifications including diet and exercise.   Relevant Orders   Lipid panel                Follow up plan: Return in about 6 months (around 09/24/2024), or Follow-up for chronic conditions.    I have reviewed this encounter including the documentation in this note and/or discussed this patient with the provider, Aislinn Womack, SNP, I am certifying that I agree with the content of this note as supervising/preceptor nurse practitioner.  Donny Gall, FNP-C [facility] 03/25/2024, 8:18 AM

## 2024-03-25 NOTE — Assessment & Plan Note (Signed)
 Continue taking Albuterol  and Symbicort  and avoiding anyway triggers.

## 2024-03-25 NOTE — Assessment & Plan Note (Signed)
 Continue taking Allegra D and avoiding any triggers.

## 2024-04-09 ENCOUNTER — Other Ambulatory Visit: Payer: Self-pay | Admitting: Nurse Practitioner

## 2024-04-09 DIAGNOSIS — M5442 Lumbago with sciatica, left side: Secondary | ICD-10-CM

## 2024-04-12 ENCOUNTER — Other Ambulatory Visit: Payer: Self-pay | Admitting: Nurse Practitioner

## 2024-04-12 DIAGNOSIS — K219 Gastro-esophageal reflux disease without esophagitis: Secondary | ICD-10-CM

## 2024-04-12 NOTE — Telephone Encounter (Signed)
 Requested Prescriptions  Pending Prescriptions Disp Refills   naproxen  (NAPROSYN ) 500 MG tablet [Pharmacy Med Name: NAPROXEN  500MG  TABLETS] 180 tablet 0    Sig: TAKE 1 TABLET(500 MG) BY MOUTH TWICE DAILY WITH A MEAL     Analgesics:  NSAIDS Failed - 04/12/2024  1:45 PM      Failed - Manual Review: Labs are only required if the patient has taken medication for more than 8 weeks.      Passed - Cr in normal range and within 360 days    Creat  Date Value Ref Range Status  03/25/2024 0.99 0.70 - 1.30 mg/dL Final         Passed - HGB in normal range and within 360 days    Hemoglobin  Date Value Ref Range Status  03/25/2024 13.3 13.2 - 17.1 g/dL Final  88/71/7983 86.6 12.6 - 17.7 g/dL Final         Passed - PLT in normal range and within 360 days    Platelets  Date Value Ref Range Status  03/25/2024 334 140 - 400 Thousand/uL Final  09/11/2015 298 150 - 379 x10E3/uL Final         Passed - HCT in normal range and within 360 days    HCT  Date Value Ref Range Status  03/25/2024 41.5 38.5 - 50.0 % Final   Hematocrit  Date Value Ref Range Status  09/11/2015 40.7 37.5 - 51.0 % Final         Passed - eGFR is 30 or above and within 360 days    GFR, Est African American  Date Value Ref Range Status  11/25/2018 102 > OR = 60 mL/min/1.46m2 Final   GFR calc Af Amer  Date Value Ref Range Status  02/08/2020 >60 >60 mL/min Final   GFR, Est Non African American  Date Value Ref Range Status  11/25/2018 88 > OR = 60 mL/min/1.88m2 Final   GFR calc non Af Amer  Date Value Ref Range Status  02/08/2020 >60 >60 mL/min Final   eGFR  Date Value Ref Range Status  03/25/2024 89 > OR = 60 mL/min/1.5m2 Final         Passed - Patient is not pregnant      Passed - Valid encounter within last 12 months    Recent Outpatient Visits           2 weeks ago Immunization due   [facility] Gareth Mliss FALCON, FNP   2 months ago Frequency of urination   [facility] Gareth Mliss FALCON, FNP       Future Appointments             Tomorrow Francisca, Redell BROCKS, MD [facility] [place]   In 5 months Gareth, Mliss FALCON, FNP [facility], [facility]

## 2024-04-13 ENCOUNTER — Ambulatory Visit (INDEPENDENT_AMBULATORY_CARE_PROVIDER_SITE_OTHER): Admitting: Urology

## 2024-04-13 VITALS — BP 126/75 | HR 68 | Ht 74.0 in | Wt 180.4 lb

## 2024-04-13 DIAGNOSIS — Z125 Encounter for screening for malignant neoplasm of prostate: Secondary | ICD-10-CM | POA: Diagnosis not present

## 2024-04-13 DIAGNOSIS — N138 Other obstructive and reflux uropathy: Secondary | ICD-10-CM

## 2024-04-13 DIAGNOSIS — N401 Enlarged prostate with lower urinary tract symptoms: Secondary | ICD-10-CM

## 2024-04-13 NOTE — Telephone Encounter (Signed)
 Requested Prescriptions  Pending Prescriptions Disp Refills   omeprazole  (PRILOSEC) 20 MG capsule [Pharmacy Med Name: OMEPRAZOLE  20MG  CAPSULES] 180 capsule 0    Sig: TAKE ONE CAPSULE BY MOUTH DAILY     Gastroenterology: Proton Pump Inhibitors Passed - 04/13/2024  4:28 PM      Passed - Valid encounter within last 12 months    Recent Outpatient Visits           2 weeks ago Immunization due   [facility] Gareth Mliss FALCON, FNP   2 months ago Frequency of urination   [facility] Gareth Mliss FALCON, FNP       Future Appointments             In 5 months Gareth, Mliss FALCON, FNP [facility], [facility]   In 12 months Francisca, Redell BROCKS, MD [facility] [place]

## 2024-04-13 NOTE — Progress Notes (Signed)
   04/13/2024 3:44 PM   Garen LITTIE Leonce Raddle. 1966/05/10 [id number]  Reason for visit: Follow up BPH/s/p urolift, microscopic hematuria, PSA screening  HPI: 58 year old male with long history of asymptomatic microscopic hematuria, obstructive urinary symptoms, 30 g prostate on CT who opted for UroLift.  Underwent uncomplicated UroLift on 03/12/2024.  Has had significant improvement in his urinary symptoms since that time, denies any complaints today.  IPSS score today is 2 with quality-of-life pleased, PVR normal at 70ml, last void was a few hours ago.  Stop Flomax  Can continue PSA screening every 2 to 4 years per guideline recommendations RTC 1 year PVR   Redell JAYSON Burnet, MD  [facility] [address] [place], [place] [address] [phone]

## 2024-04-20 ENCOUNTER — Other Ambulatory Visit: Payer: Self-pay | Admitting: Nurse Practitioner

## 2024-04-20 DIAGNOSIS — J0140 Acute pansinusitis, unspecified: Secondary | ICD-10-CM

## 2024-04-20 DIAGNOSIS — N529 Male erectile dysfunction, unspecified: Secondary | ICD-10-CM

## 2024-04-21 ENCOUNTER — Other Ambulatory Visit: Payer: Self-pay | Admitting: Nurse Practitioner

## 2024-04-21 DIAGNOSIS — M5442 Lumbago with sciatica, left side: Secondary | ICD-10-CM

## 2024-04-22 NOTE — Telephone Encounter (Signed)
 Requested medications are due for refill today.  yes  Requested medications are on the active medications list.  yes  Last refill. 03/18/2024 #60 0 rf  Future visit scheduled.   yes  Notes to clinic.  Refill not delegated.    Requested Prescriptions  Pending Prescriptions Disp Refills   ALLEGRA-D ALLERGY  & CONGESTION 60-120 MG 12 hr tablet [Pharmacy Med Name: ALLEGRA-D 12HR TABLETS 30'S (OTC)] 60 tablet 0    Sig: TAKE 1 TABLET BY MOUTH TWICE DAILY     Not Delegated - Ear, Nose, and Throat:  Combination Products with Pseudoephedrine Failed - 04/22/2024  4:08 PM      Failed - This refill cannot be delegated      Passed - Last BP in normal range    BP Readings from Last 1 Encounters:  04/13/24 126/75         Passed - Valid encounter within last 12 months    Recent Outpatient Visits           4 weeks ago Immunization due   [facility] Gareth Mliss FALCON, FNP   3 months ago Frequency of urination   [facility] Gareth Mliss FALCON, FNP       Future Appointments             In 5 months Gareth, Mliss FALCON, FNP [facility], [facility]   In 11 months Francisca Redell BROCKS, MD [facility] [place]            Signed Prescriptions Disp Refills   sildenafil  (VIAGRA ) 100 MG tablet 10 tablet 2    Sig: TAKE 1 TABLET(100 MG) BY MOUTH DAILY AS NEEDED FOR ERECTILE DYSFUNCTION     Urology: Erectile Dysfunction Agents Passed - 04/22/2024  4:08 PM      Passed - AST in normal range and within 360 days    AST  Date Value Ref Range Status  03/25/2024 22 10 - 35 U/L Final         Passed - ALT in normal range and within 360 days    ALT  Date Value Ref Range Status  03/25/2024 22 9 - 46 U/L Final         Passed - Last BP in normal range    BP Readings from Last 1 Encounters:  04/13/24 126/75         Passed - Valid encounter within last 12 months    Recent Outpatient Visits           4 weeks ago Immunization due    [facility] Gareth Mliss FALCON, FNP   3 months ago Frequency of urination   [facility] Gareth Mliss FALCON, FNP       Future Appointments             In 5 months Gareth, Mliss FALCON, FNP [facility], [facility]   In 11 months Francisca, Redell BROCKS, MD [facility] [place]

## 2024-04-22 NOTE — Telephone Encounter (Signed)
 Requested Prescriptions  Pending Prescriptions Disp Refills   ALLEGRA-D ALLERGY  & CONGESTION 60-120 MG 12 hr tablet [Pharmacy Med Name: ALLEGRA-D 12HR TABLETS 30'S (OTC)] 60 tablet 0    Sig: TAKE 1 TABLET BY MOUTH TWICE DAILY     Not Delegated - Ear, Nose, and Throat:  Combination Products with Pseudoephedrine Failed - 04/22/2024  4:07 PM      Failed - This refill cannot be delegated      Passed - Last BP in normal range    BP Readings from Last 1 Encounters:  04/13/24 126/75         Passed - Valid encounter within last 12 months    Recent Outpatient Visits           4 weeks ago Immunization due   [facility] Gareth Mliss FALCON, FNP   3 months ago Frequency of urination   [facility] Gareth Mliss FALCON, FNP       Future Appointments             In 5 months Gareth, Mliss FALCON, FNP [facility], [facility]   In 11 months Francisca Redell BROCKS, MD [facility] [place]             sildenafil  (VIAGRA ) 100 MG tablet [Pharmacy Med Name: SILDENAFIL  100MG  TABLETS] 10 tablet 0    Sig: TAKE 1 TABLET(100 MG) BY MOUTH DAILY AS NEEDED FOR ERECTILE DYSFUNCTION     Urology: Erectile Dysfunction Agents Passed - 04/22/2024  4:07 PM      Passed - AST in normal range and within 360 days    AST  Date Value Ref Range Status  03/25/2024 22 10 - 35 U/L Final         Passed - ALT in normal range and within 360 days    ALT  Date Value Ref Range Status  03/25/2024 22 9 - 46 U/L Final         Passed - Last BP in normal range    BP Readings from Last 1 Encounters:  04/13/24 126/75         Passed - Valid encounter within last 12 months    Recent Outpatient Visits           4 weeks ago Immunization due   [facility] Gareth Mliss FALCON, FNP   3 months ago Frequency of urination   [facility] Gareth Mliss FALCON, FNP       Future Appointments             In 5 months  Gareth, Mliss FALCON, FNP [facility], [facility]   In 11 months Francisca, Redell BROCKS, MD [facility] [place]

## 2024-04-23 NOTE — Telephone Encounter (Signed)
 Too soon for refill.  Requested Prescriptions  Pending Prescriptions Disp Refills   naproxen  (NAPROSYN ) 500 MG tablet [Pharmacy Med Name: NAPROXEN  500MG  TABLETS] 180 tablet 0    Sig: TAKE 1 TABLET(500 MG) BY MOUTH TWICE DAILY WITH A MEAL     Analgesics:  NSAIDS Failed - 04/23/2024 11:08 AM      Failed - Manual Review: Labs are only required if the patient has taken medication for more than 8 weeks.      Passed - Cr in normal range and within 360 days    Creat  Date Value Ref Range Status  03/25/2024 0.99 0.70 - 1.30 mg/dL Final         Passed - HGB in normal range and within 360 days    Hemoglobin  Date Value Ref Range Status  03/25/2024 13.3 13.2 - 17.1 g/dL Final  88/71/7983 86.6 12.6 - 17.7 g/dL Final         Passed - PLT in normal range and within 360 days    Platelets  Date Value Ref Range Status  03/25/2024 334 140 - 400 Thousand/uL Final  09/11/2015 298 150 - 379 x10E3/uL Final         Passed - HCT in normal range and within 360 days    HCT  Date Value Ref Range Status  03/25/2024 41.5 38.5 - 50.0 % Final   Hematocrit  Date Value Ref Range Status  09/11/2015 40.7 37.5 - 51.0 % Final         Passed - eGFR is 30 or above and within 360 days    GFR, Est African American  Date Value Ref Range Status  11/25/2018 102 > OR = 60 mL/min/1.26m2 Final   GFR calc Af Amer  Date Value Ref Range Status  02/08/2020 >60 >60 mL/min Final   GFR, Est Non African American  Date Value Ref Range Status  11/25/2018 88 > OR = 60 mL/min/1.25m2 Final   GFR calc non Af Amer  Date Value Ref Range Status  02/08/2020 >60 >60 mL/min Final   eGFR  Date Value Ref Range Status  03/25/2024 89 > OR = 60 mL/min/1.77m2 Final         Passed - Patient is not pregnant      Passed - Valid encounter within last 12 months    Recent Outpatient Visits           4 weeks ago Immunization due   [facility] Gareth Mliss FALCON, FNP   3 months ago Frequency of  urination   [facility] Gareth Mliss FALCON, FNP       Future Appointments             In 5 months Gareth, Mliss FALCON, FNP [facility], [facility]   In 11 months Francisca, Redell BROCKS, MD [facility] [place]

## 2024-04-24 ENCOUNTER — Other Ambulatory Visit: Payer: Self-pay | Admitting: Nurse Practitioner

## 2024-04-24 DIAGNOSIS — R399 Unspecified symptoms and signs involving the genitourinary system: Secondary | ICD-10-CM

## 2024-04-26 ENCOUNTER — Telehealth: Admitting: Physician Assistant

## 2024-04-26 DIAGNOSIS — R3916 Straining to void: Secondary | ICD-10-CM

## 2024-04-26 NOTE — Progress Notes (Signed)
 Because your question is for Dr. Gareth please reach out to her office tomorrow during office hours. Please contact your primary care physician practice to be seen. Many offices offer virtual options to be seen via video if you are not comfortable going in person to a medical facility at this time.  NOTE: You will NOT be charged for this eVisit.  If you do not have a PCP, [facility] offers a free physician referral service available at [phone]. Our trained staff has the experience, knowledge and resources to put you in touch with a physician who is right for you.    If you are having a true medical emergency please call 911.   Your e-visit answers were reviewed by a board certified advanced clinical practitioner to complete your personal care plan.  Thank you for using e-Visits.

## 2024-04-26 NOTE — Telephone Encounter (Signed)
 Requested Prescriptions  Pending Prescriptions Disp Refills   tamsulosin  (FLOMAX ) 0.4 MG CAPS capsule [Pharmacy Med Name: TAMSULOSIN  0.4MG  CAPSULES] 180 capsule 1    Sig: TAKE 2 CAPSULES(0.8 MG) BY MOUTH DAILY     Urology: Alpha-Adrenergic Blocker Passed - 04/26/2024  4:30 PM      Passed - PSA in normal range and within 360 days    PSA  Date Value Ref Range Status  01/16/2024 2.61 < OR = 4.00 ng/mL Final    Comment:    The total PSA value from this assay system is  standardized against the WHO standard. The test  result will be approximately 20% lower when compared  to the equimolar-standardized total PSA (Beckman  Coulter). Comparison of serial PSA results should be  interpreted with this fact in mind. . This test was performed using the Siemens  chemiluminescent method. Values obtained from  different assay methods cannot be used interchangeably. PSA levels, regardless of value, should not be interpreted as absolute evidence of the presence or absence of disease.    Prostate Specific Ag, Serum  Date Value Ref Range Status  04/05/2015 1.3 0.0 - 4.0 ng/mL Final    Comment:    Roche ECLIA methodology. According to the American Urological Association, Serum PSA should decrease and remain at undetectable levels after radical prostatectomy. The AUA defines biochemical recurrence as an initial PSA value 0.2 ng/mL or greater followed by a subsequent confirmatory PSA value 0.2 ng/mL or greater. Values obtained with different assay methods or kits cannot be used interchangeably. Results cannot be interpreted as absolute evidence of the presence or absence of malignant disease.          Passed - Last BP in normal range    BP Readings from Last 1 Encounters:  04/13/24 126/75         Passed - Valid encounter within last 12 months    Recent Outpatient Visits           1 month ago Immunization due   [facility] Gareth Mliss FALCON, FNP   3 months ago  Frequency of urination   [facility] Gareth Mliss FALCON, FNP       Future Appointments             In 5 months Gareth, Mliss FALCON, FNP [facility]   In 11 months Francisca, Redell BROCKS, MD [facility] [place]

## 2024-04-28 ENCOUNTER — Telehealth: Payer: Self-pay

## 2024-04-28 NOTE — Telephone Encounter (Signed)
 Copied from CRM [id number]. Topic: Clinical - Medication Question >> Apr 28, 2024  9:22 AM Powell HERO wrote: Reason for CRM: Patient states that he was supposed to stop taking tamsulosin  (FLOMAX ) 0.4 MG CAPS capsule, he states that he stopped and the next 3-4 days was horrible. He states he started taking it again and is feeling better. He said when he stopped taking the pressure came back and the trickling. He would like someone to reach out with any advice, also wants to know what he should do if he should not still be taking this med.

## 2024-04-28 NOTE — Telephone Encounter (Signed)
 No vm, sent mychart note

## 2024-05-04 ENCOUNTER — Other Ambulatory Visit: Payer: Self-pay | Admitting: Nurse Practitioner

## 2024-05-04 DIAGNOSIS — G4709 Other insomnia: Secondary | ICD-10-CM

## 2024-05-06 NOTE — Telephone Encounter (Signed)
 Requested Prescriptions  Pending Prescriptions Disp Refills   traZODone  (DESYREL ) 50 MG tablet [Pharmacy Med Name: TRAZODONE  50MG  TABLETS] 30 tablet 0    Sig: TAKE 1/2 TO 1 TABLET(25 TO 50 MG) BY MOUTH AT BEDTIME AS NEEDED     Psychiatry: Antidepressants - Serotonin Modulator Passed - 05/06/2024 10:23 AM      Passed - Valid encounter within last 6 months    Recent Outpatient Visits           1 month ago Immunization due   [facility] Gareth Mliss FALCON, FNP   3 months ago Frequency of urination   [facility] Gareth Mliss FALCON, FNP       Future Appointments             In 4 months Gareth, Mliss FALCON, FNP [facility]   In 11 months Francisca, Redell BROCKS, MD [facility] [place]

## 2024-05-31 ENCOUNTER — Other Ambulatory Visit: Payer: Self-pay | Admitting: Nurse Practitioner

## 2024-05-31 DIAGNOSIS — J0140 Acute pansinusitis, unspecified: Secondary | ICD-10-CM

## 2024-06-02 NOTE — Telephone Encounter (Signed)
 Requested medication (s) are due for refill today: yes  Requested medication (s) are on the active medication list: yes  Last refill:  04/22/24 #60  Future visit scheduled: yes  Notes to clinic:  med not delegated to NT to RF   Requested Prescriptions  Pending Prescriptions Disp Refills   ALLEGRA-D ALLERGY  & CONGESTION 60-120 MG 12 hr tablet [Pharmacy Med Name: ALLEGRA-D 12HR TABLETS 30'S (OTC)] 60 tablet 0    Sig: TAKE 1 TABLET BY MOUTH TWICE DAILY     Not Delegated - Ear, Nose, and Throat:  Combination Products with Pseudoephedrine Failed - 06/02/2024  2:15 PM      Failed - This refill cannot be delegated      Passed - Last BP in normal range    BP Readings from Last 1 Encounters:  04/13/24 126/75         Passed - Valid encounter within last 12 months    Recent Outpatient Visits           2 months ago Immunization due   [facility] Gareth Mliss FALCON, FNP   4 months ago Frequency of urination   [facility] Gareth Mliss FALCON, FNP       Future Appointments             In 3 months Gareth, Mliss FALCON, FNP [facility], [facility]   In 10 months Francisca, Redell BROCKS, MD [facility] [place]

## 2024-07-08 ENCOUNTER — Other Ambulatory Visit: Payer: Self-pay | Admitting: Nurse Practitioner

## 2024-07-08 DIAGNOSIS — J0140 Acute pansinusitis, unspecified: Secondary | ICD-10-CM

## 2024-07-09 NOTE — Telephone Encounter (Signed)
 Requested medications are due for refill today.  yes  Requested medications are on the active medications list.  yes  Last refill. 06/02/2024 #60  0 rf  Future visit scheduled.   yes  Notes to clinic.  Refill not delegated    Requested Prescriptions  Pending Prescriptions Disp Refills   ALLEGRA-D ALLERGY  & CONGESTION 60-120 MG 12 hr tablet [Pharmacy Med Name: ALLEGRA-D 12HR TABLETS 30'S (OTC)] 60 tablet 0    Sig: TAKE 1 TABLET BY MOUTH TWICE DAILY     Not Delegated - Ear, Nose, and Throat:  Combination Products with Pseudoephedrine Failed - 07/09/2024  3:41 PM      Failed - This refill cannot be delegated      Passed - Last BP in normal range    BP Readings from Last 1 Encounters:  04/13/24 126/75         Passed - Valid encounter within last 12 months    Recent Outpatient Visits           3 months ago Immunization due   [facility] Gareth Mliss FALCON, FNP   5 months ago Frequency of urination   [facility] Gareth Mliss FALCON, FNP       Future Appointments             In 2 months Gareth, Mliss FALCON, FNP [facility] [place]   In 9 months Francisca, Redell BROCKS, MD [facility] [place]

## 2024-07-19 ENCOUNTER — Other Ambulatory Visit: Payer: Self-pay | Admitting: Nurse Practitioner

## 2024-07-19 DIAGNOSIS — K219 Gastro-esophageal reflux disease without esophagitis: Secondary | ICD-10-CM

## 2024-07-20 NOTE — Telephone Encounter (Signed)
 Requested Prescriptions  Refused Prescriptions Disp Refills   omeprazole  (PRILOSEC) 20 MG capsule [Pharmacy Med Name: OMEPRAZOLE  20MG  CAPSULES] 90 capsule 1    Sig: TAKE 1 CAPSULE BY MOUTH DAILY     Gastroenterology: Proton Pump Inhibitors Passed - 07/20/2024  1:57 PM      Passed - Valid encounter within last 12 months    Recent Outpatient Visits           3 months ago Immunization due   [facility] Gareth Mliss FALCON, FNP   6 months ago Frequency of urination   [facility] Gareth Mliss FALCON, FNP       Future Appointments             In 2 months Gareth, Mliss FALCON, FNP [facility], [place]   In 8 months Francisca, Redell BROCKS, MD [facility] [place]

## 2024-07-21 ENCOUNTER — Ambulatory Visit

## 2024-07-21 DIAGNOSIS — Z23 Encounter for immunization: Secondary | ICD-10-CM | POA: Diagnosis not present

## 2024-08-09 ENCOUNTER — Other Ambulatory Visit: Payer: Self-pay

## 2024-08-09 ENCOUNTER — Encounter: Payer: Self-pay | Admitting: Emergency Medicine

## 2024-08-09 ENCOUNTER — Emergency Department
Admission: EM | Admit: 2024-08-09 | Discharge: 2024-08-09 | Disposition: A | Discharge status: Home / Self Care | Attending: Emergency Medicine | Admitting: Emergency Medicine

## 2024-08-09 DIAGNOSIS — M546 Pain in thoracic spine: Secondary | ICD-10-CM | POA: Insufficient documentation

## 2024-08-09 DIAGNOSIS — M5441 Lumbago with sciatica, right side: Secondary | ICD-10-CM | POA: Insufficient documentation

## 2024-08-09 MED ORDER — LIDOCAINE 5 % EX PTCH
1.0000 | MEDICATED_PATCH | CUTANEOUS | Status: DC
  Administered 2024-08-09: 1 via TRANSDERMAL
  Filled 2024-08-09: qty 1

## 2024-08-09 MED ORDER — DIAZEPAM 5 MG PO TABS
10.0000 mg | ORAL_TABLET | Freq: Once | ORAL | Status: AC
  Administered 2024-08-09: 10 mg via ORAL
  Filled 2024-08-09: qty 2

## 2024-08-09 MED ORDER — PREDNISONE 10 MG (21) PO TBPK: ORAL_TABLET | ORAL | 0 refills | Status: AC

## 2024-08-09 MED ORDER — DEXAMETHASONE SOD PHOSPHATE PF 10 MG/ML IJ SOLN
10.0000 mg | Freq: Once | INTRAMUSCULAR | Status: AC
  Administered 2024-08-09: 10 mg via INTRAMUSCULAR

## 2024-08-09 MED ORDER — METHOCARBAMOL 500 MG PO TABS: 500.0000 mg | ORAL_TABLET | Freq: Three times a day (TID) | ORAL | 0 refills | Status: AC | PRN

## 2024-08-09 MED ORDER — NAPROXEN 500 MG PO TABS: 500.0000 mg | ORAL_TABLET | Freq: Two times a day (BID) | ORAL | 0 refills | Status: AC

## 2024-08-09 MED ORDER — KETOROLAC TROMETHAMINE 15 MG/ML IJ SOLN
15.0000 mg | Freq: Once | INTRAMUSCULAR | Status: AC
  Administered 2024-08-09: 15 mg via INTRAMUSCULAR
  Filled 2024-08-09: qty 1

## 2024-08-09 NOTE — Discharge Instructions (Signed)
 You were seen in the emergency department today for evaluation of your back pain. Fortunately, your exam here is reassuring. You can take Tylenol  to help with your pain.  Have also sent a prescription for an anti-inflammatory called naproxen , a prednisone  course, and a muscle relaxer to your pharmacy. You can also try lidocaine  patches that are available over-the-counter.   Do not drive or operate machinery when taking the muscle relaxer as it can make you drowsy. Follow-up with your primary care provider within a few days for reevaluation. Return to the ER for any worsening symptoms including numbness, tingling, weakness, bowel or bladder incontinence, or any other new or concerning symptoms.  If your symptoms are not improving, I have included information for follow-up with a spine specialist for further evaluation.

## 2024-08-09 NOTE — ED Provider Notes (Signed)
 [facility] Provider Note    Event Date/Time   First MD Initiated Contact with Patient 08/09/24 [phone]     (approximate)   History   Back Pain   HPI  Douglas Abbott. is a 58 year old male presenting to the ER for evaluation of back pain. On Friday, patient began to have worsening right lower back pain with radiation down his right leg.  Reports a history of back and leg pain, but this feels more intense.  Notes that about a year ago he thinks he had something similar and received an injection at his primary care office that helped, unsure which side it was on.  Does do regular heavy lifting at his job.  No recent falls or specific identifiable injury.  No new bowel or bladder symptoms.  No fevers.  Reviewed primary care office visit from 07/25/2023.  At that time patient was seen for low back pain with left-sided sciatica.  Received Toradol  and Decadron  in office and was discharged on prednisone , naproxen , methocarbamol .     Physical Exam   Triage Vital Signs: ED Triage Vitals  Encounter Vitals Group     BP 08/09/24 0857 (!) 138/92     Girls Systolic BP Percentile --      Girls Diastolic BP Percentile --      Boys Systolic BP Percentile --      Boys Diastolic BP Percentile --      Pulse Rate 08/09/24 0857 70     Resp 08/09/24 0857 20     Temp 08/09/24 0857 98.2 F (36.8 C)     Temp Source 08/09/24 0857 Oral     SpO2 08/09/24 0857 100 %     Weight 08/09/24 0858 180 lb (81.6 kg)     Height 08/09/24 0858 6' 2 (1.88 m)     Head Circumference --      Peak Flow --      Pain Score 08/09/24 0858 10     Pain Loc --      Pain Education --      Exclude from Growth Chart --     Most recent vital signs: Vitals:   08/09/24 0857  BP: (!) 138/92  Pulse: 70  Resp: 20  Temp: 98.2 F (36.8 C)  SpO2: 100%     General: Awake, interactive  CV:  Good peripheral perfusion Resp:  Unlabored respirations Abd:  Nondistended.  Neuro:  Symmetric facial  movement, fluid speech, 5 out of 5 strength of bilateral upper and lower extremities MSK:  No midline back tenderness.  There is reproducible tenderness to palpation of the right sided lower thoracic and lumbar paraspinous musculature   ED Results / Procedures / Treatments   Labs (all labs ordered are listed, but only abnormal results are displayed) Labs Reviewed - No data to display   EKG EKG independently reviewed and interpreted by myself demonstrates:    RADIOLOGY Imaging independently reviewed and interpreted by myself demonstrates:   Formal Radiology Read:  No results found.  PROCEDURES:  Critical Care performed: No  Procedures   MEDICATIONS ORDERED IN ED: Medications  lidocaine  (LIDODERM ) 5 % 1 patch (1 patch Transdermal Patch Applied 08/09/24 0912)  ketorolac  (TORADOL ) 15 MG/ML injection 15 mg (15 mg Intramuscular Given 08/09/24 0914)  diazepam (VALIUM) tablet 10 mg (10 mg Oral Given 08/09/24 0912)  dexamethasone  (DECADRON ) injection 10 mg (10 mg Intramuscular Given 08/09/24 0914)     IMPRESSION / MDM / ASSESSMENT AND PLAN /  ED COURSE  I reviewed the triage vital signs and the nursing notes.  Differential diagnosis includes, but is not limited to, musculoskeletal strain, sciatica, no recent trauma or focal area tenderness suggestive of fracture, no bowel or bladder symptoms or other clinical history suggestive of acute spinal cord pathology  Patient's presentation is most consistent with acute, uncomplicated illness.  58 year old male presenting to the emergency department for evaluation of right back and hip pain with radiation into his right leg.  Stable vitals on presentation.  Clinical history seems most consistent with low back pain with associated sciatica.  No red flag findings suggestive of acute spinal cord compression.  No indication for labs or imaging.  Will treat with multimodal pain control and reassess.  Clinical Course as of 08/09/24 1011  Mon  Aug 09, 2024  1005 Patient reassessed and report significant improvement in his pain. Previous improvement with prednisone  taper, naproxen , Robaxin .  Will send prescription for these.  Patient is comfortable with discharge home.  Strict return precautions provided.  Patient discharged in stable condition. [NR]    Clinical Course User Index [NR] Levander Slate, MD     FINAL CLINICAL IMPRESSION(S) / ED DIAGNOSES   Final diagnoses:  Acute right-sided low back pain with right-sided sciatica     Rx / DC Orders   ED Discharge Orders          Ordered    naproxen  (NAPROSYN ) 500 MG tablet  2 times daily with meals        08/09/24 1011    methocarbamol  (ROBAXIN ) 500 MG tablet  Every 8 hours PRN        08/09/24 1011    predniSONE  (STERAPRED UNI-PAK 21 TAB) 10 MG (21) TBPK tablet  Daily        08/09/24 1011             Note:  This document was prepared using Dragon voice recognition software and may include unintentional dictation errors.   Levander Slate, MD 08/09/24 1011

## 2024-08-09 NOTE — ED Triage Notes (Signed)
 Presents via EMS from pcp's office  Developed pain to right hip and moving into right leg  Denies any injury

## 2024-08-10 ENCOUNTER — Other Ambulatory Visit: Payer: Self-pay | Admitting: Nurse Practitioner

## 2024-08-10 DIAGNOSIS — R399 Unspecified symptoms and signs involving the genitourinary system: Secondary | ICD-10-CM

## 2024-08-11 ENCOUNTER — Telehealth (INDEPENDENT_AMBULATORY_CARE_PROVIDER_SITE_OTHER): Admitting: Internal Medicine

## 2024-08-11 ENCOUNTER — Encounter: Payer: Self-pay | Admitting: Internal Medicine

## 2024-08-11 ENCOUNTER — Other Ambulatory Visit: Payer: Self-pay

## 2024-08-11 DIAGNOSIS — M25551 Pain in right hip: Secondary | ICD-10-CM | POA: Diagnosis not present

## 2024-08-11 DIAGNOSIS — M5441 Lumbago with sciatica, right side: Secondary | ICD-10-CM

## 2024-08-11 NOTE — Progress Notes (Signed)
 Virtual Visit via Video Note  I connected with Douglas Abbott. on 08/11/24 at  1:00 PM EDT by a video enabled telemedicine application and verified that I am speaking with the correct person using two identifiers.  Location: Patient: Home Provider: [facility]   I discussed the limitations of evaluation and management by telemedicine and the availability of in person appointments. The patient expressed understanding and agreed to proceed.  History of Present Illness:  Patient is presenting via telemedicine for ER follow up.   Discharge Date: 08/09/24 Diagnosis: acute right low back pain with right sciatica  Procedures/tests: none New medications: Given Dexamethasone  IM, Toradol  IM and Lidocaine  patch and Naproxen  500 mg BID, Prednisone  and Robaxin   Discontinued medications: None Status: fluctuating  Discussed the use of AI scribe software for clinical note transcription with the patient, who gave verbal consent to proceed.  History of Present Illness Douglas Abbott. is a 58 year old male who presents with severe right-sided sciatica.  He has experienced severe right-sided sciatica for two days, with pain originating in the right hip and radiating to the knee. The pain feels like 'bone on bone' and gives a sensation that the hip might 'slip out of joint'. Walking for five to seven minutes worsens the pain, while lying down provides relief. Sitting does not alleviate the pain, and it affects his ability to work 12-hour shifts involving bending and lifting.  Two days ago, he visited the emergency room and was diagnosed with right-sided sciatica. He received a steroid injection and a Toradol  injection, reducing his pain from 9-10 to 7-8 on a pain scale. He was prescribed naproxen  500 mg twice daily, prednisone , and a muscle relaxer every eight hours. He is unsure about continuing his prior naproxen  prescription. He also uses Voltaren gel for additional relief.  The pain is primarily in  the right hip, extending to the side of the hip bone. The muscle relaxer provides no significant relief, and the pain intensifies with movement.    Observations/Objective:  General: well appearing, no acute distress, laying in bed ENT: conjunctiva normal appearing bilaterally  Cardio: RRR, no murmurs, rubs or gallops auscultated Skin: no rashes, cyanosis or abnormal bruising noted Neuro: answers all questions appropriately   Assessment and Plan: Assessment & Plan Right-sided sciatica and right hip pain, possible underlying joint pathology Acute right-sided sciatica with severe pain radiating from hip to knee, possibly due to hip joint pathology such as arthritis or vascular insufficiency. Consider acute sciatica flare or overuse injury. - Advise rest, avoid bending and lifting. - Prescribe naproxen  500 mg twice daily. - Prescribe prednisone  as directed. - Prescribe muscle relaxant every 8 hours as needed. - Advise use of Voltaren gel as needed. - Advise use of heating pad, lidocaine  patch, Bengay, or Icy Hot as needed. - Provide work note for one week of rest. - If symptoms persist, schedule further evaluation, including possible X-ray or referral to orthopedics.  Follow Up Instructions: 1 week if symptoms fail to improve or worsen    I discussed the assessment and treatment plan with the patient. The patient was provided an opportunity to ask questions and all were answered. The patient agreed with the plan and demonstrated an understanding of the instructions.   The patient was advised to call back or seek an in-person evaluation if the symptoms worsen or if the condition fails to improve as anticipated.  I provided 8 minutes of non-face-to-face time during this encounter.   Sharyle Fischer, DO

## 2024-08-11 NOTE — Telephone Encounter (Signed)
 Too soon for refill.  Requested Prescriptions  Pending Prescriptions Disp Refills   tamsulosin  (FLOMAX ) 0.4 MG CAPS capsule [Pharmacy Med Name: TAMSULOSIN  0.4MG  CAPSULES] 180 capsule 1    Sig: TAKE 2 CAPSULES(0.8 MG) BY MOUTH DAILY     Urology: Alpha-Adrenergic Blocker Failed - 08/11/2024  3:47 PM      Failed - Last BP in normal range    BP Readings from Last 1 Encounters:  08/09/24 (!) 138/92         Passed - PSA in normal range and within 360 days    PSA  Date Value Ref Range Status  01/16/2024 2.61 < OR = 4.00 ng/mL Final    Comment:    The total PSA value from this assay system is  standardized against the WHO standard. The test  result will be approximately 20% lower when compared  to the equimolar-standardized total PSA (Beckman  Coulter). Comparison of serial PSA results should be  interpreted with this fact in mind. . This test was performed using the Siemens  chemiluminescent method. Values obtained from  different assay methods cannot be used interchangeably. PSA levels, regardless of value, should not be interpreted as absolute evidence of the presence or absence of disease.    Prostate Specific Ag, Serum  Date Value Ref Range Status  04/05/2015 1.3 0.0 - 4.0 ng/mL Final    Comment:    Roche ECLIA methodology. According to the American Urological Association, Serum PSA should decrease and remain at undetectable levels after radical prostatectomy. The AUA defines biochemical recurrence as an initial PSA value 0.2 ng/mL or greater followed by a subsequent confirmatory PSA value 0.2 ng/mL or greater. Values obtained with different assay methods or kits cannot be used interchangeably. Results cannot be interpreted as absolute evidence of the presence or absence of malignant disease.          Passed - Valid encounter within last 12 months    Recent Outpatient Visits           Today Acute right-sided low back pain with right-sided sciatica   [facility] Bernardo Fend, DO   4 months ago Immunization due   [facility] Gareth Mliss FALCON, FNP   6 months ago Frequency of urination   [facility] Gareth Mliss FALCON, FNP       Future Appointments             In 1 month Gareth, Mliss FALCON, FNP [facility], [place]   In 8 months Francisca, Redell BROCKS, MD [facility] [place]

## 2024-08-12 ENCOUNTER — Ambulatory Visit: Payer: Self-pay

## 2024-08-12 ENCOUNTER — Ambulatory Visit: Admitting: Family Medicine

## 2024-08-12 ENCOUNTER — Emergency Department

## 2024-08-12 ENCOUNTER — Emergency Department
Admission: EM | Admit: 2024-08-12 | Discharge: 2024-08-12 | Disposition: A | Discharge status: Home / Self Care | Attending: Emergency Medicine | Admitting: Emergency Medicine

## 2024-08-12 ENCOUNTER — Other Ambulatory Visit: Payer: Self-pay

## 2024-08-12 DIAGNOSIS — M545 Low back pain, unspecified: Secondary | ICD-10-CM | POA: Diagnosis present

## 2024-08-12 DIAGNOSIS — M5441 Lumbago with sciatica, right side: Secondary | ICD-10-CM | POA: Diagnosis not present

## 2024-08-12 DIAGNOSIS — J4489 Other specified chronic obstructive pulmonary disease: Secondary | ICD-10-CM | POA: Insufficient documentation

## 2024-08-12 DIAGNOSIS — M5431 Sciatica, right side: Secondary | ICD-10-CM

## 2024-08-12 MED ORDER — OXYCODONE-ACETAMINOPHEN 5-325 MG PO TABS
2.0000 | ORAL_TABLET | Freq: Once | ORAL | Status: AC
  Administered 2024-08-12: 2 via ORAL
  Filled 2024-08-12: qty 2

## 2024-08-12 MED ORDER — OXYCODONE-ACETAMINOPHEN 5-325 MG PO TABS: 1.0000 | ORAL_TABLET | Freq: Four times a day (QID) | ORAL | 0 refills | Status: AC | PRN

## 2024-08-12 MED ORDER — GABAPENTIN 300 MG PO CAPS
300.0000 mg | ORAL_CAPSULE | ORAL | Status: AC
  Administered 2024-08-12: 300 mg via ORAL
  Filled 2024-08-12: qty 1

## 2024-08-12 MED ORDER — GABAPENTIN 300 MG PO CAPS: 300.0000 mg | ORAL_CAPSULE | Freq: Three times a day (TID) | ORAL | 0 refills | Status: AC

## 2024-08-12 NOTE — ED Provider Notes (Signed)
 [facility] Provider Note    Event Date/Time   First MD Initiated Contact with Patient 08/12/24 1501     (approximate)   History   Chief Complaint: Back Pain   HPI  Douglas L Ifeanyichukwu Wickham. is a 58 y.o. male with a history of GERD, COPD who comes ED complaining of low back pain radiating down the right leg.  Worse with movement.  No falls or trauma, no bowel or bladder incontinence or retention, no fever, no groin numbness.  No lower extremity weakness.  Has been seen previously for this, was prescribed muscle relaxers, prednisone  taper, NSAIDs, but has not found this to be effective.  Tried to make an appointment with orthopedics but he had a balance due and could not be seen until this was settled.  Denies body aches, chills, weight loss        Past Medical History:  Diagnosis Date   Allergic rhinitis    Allergy     Asthma    BPH with urinary obstruction    BRBPR (bright red blood per rectum) 03/27/2018   COPD (chronic obstructive pulmonary disease) (HCC)    Decreased libido    Dysrhythmia    GERD (gastroesophageal reflux disease)    Hemorrhoids    History of colon polyps    History of methicillin resistant staphylococcus aureus (MRSA) 2015   History of pleurisy    Lower urinary tract symptoms (LUTS)    Microscopic hematuria    Nasal polyps    Sinus bradycardia    a. 03/2017 Echo: Ef 50-55%.   Vitamin D  deficiency     Current Outpatient Rx   Order #: [id number] Class: Normal   Order #: [id number] Class: Normal   Order #: [id number] Class: Normal   Order #: [id number] Class: Normal   Order #: [id number] Class: Normal   Order #: [id number] Class: Normal   Order #: [id number] Class: Normal   Order #: [id number] Class: Normal   Order #: [id number] Class: Normal   Order #: [id number] Class: Normal   Order #: [id number] Class: Normal   Order #: [id number] Class: Normal   Order #: [id number] Class: Normal    Past Surgical History:  Procedure Laterality Date    COLONOSCOPY WITH PROPOFOL  N/A 05/04/2018   Procedure: COLONOSCOPY WITH PROPOFOL ;  Surgeon: Therisa Bi, MD;  Location: [facility] ENDOSCOPY;  Service: Gastroenterology;  Laterality: N/A;   COLONOSCOPY WITH PROPOFOL  N/A 05/21/2023   Procedure: COLONOSCOPY WITH PROPOFOL ;  Surgeon: Unk Corinn Skiff, MD;  Location: [facility] ENDOSCOPY;  Service: Gastroenterology;  Laterality: N/A;   CYSTOSCOPY WITH INSERTION OF UROLIFT N/A 03/12/2024   Procedure: CYSTOSCOPY WITH INSERTION OF UROLIFT;  Surgeon: Francisca Redell BROCKS, MD;  Location: [facility] ORS;  Service: Urology;  Laterality: N/A;   ESOPHAGOGASTRODUODENOSCOPY (EGD) WITH PROPOFOL  N/A 04/10/2022   Procedure: ESOPHAGOGASTRODUODENOSCOPY (EGD) WITH PROPOFOL ;  Surgeon: Unk Corinn Skiff, MD;  Location: [facility] ENDOSCOPY;  Service: Gastroenterology;  Laterality: N/A;    Physical Exam   Triage Vital Signs: ED Triage Vitals  Encounter Vitals Group     BP 08/12/24 1433 107/84     Girls Systolic BP Percentile --      Girls Diastolic BP Percentile --      Boys Systolic BP Percentile --      Boys Diastolic BP Percentile --      Pulse Rate 08/12/24 1433 75     Resp 08/12/24 1433 18     Temp 08/12/24 1433 98.3 F (36.8 C)     Temp Source 08/12/24 1433 Oral     SpO2 08/12/24  1433 100 %     Weight 08/12/24 1434 175 lb (79.4 kg)     Height 08/12/24 1434 6' 2 (1.88 m)     Head Circumference --      Peak Flow --      Pain Score 08/12/24 1433 10     Pain Loc --      Pain Education --      Exclude from Growth Chart --     Most recent vital signs: Vitals:   08/12/24 1433  BP: 107/84  Pulse: 75  Resp: 18  Temp: 98.3 F (36.8 C)  SpO2: 100%    General: Awake, no distress.  CV:  Good peripheral perfusion.  Regular rate, normal distal pulses Resp:  Normal effort.  Abd:  No distention.  Other:  Tenderness in the area of L3 and the right lumbar musculature   ED Results / Procedures / Treatments   Labs (all labs ordered are listed, but only abnormal results are  displayed) Labs Reviewed - No data to display   EKG    RADIOLOGY X-ray L-spine shows degenerative changes, no fracture   PROCEDURES:  Procedures   MEDICATIONS ORDERED IN ED: Medications  gabapentin (NEURONTIN) capsule 300 mg (300 mg Oral Given 08/12/24 1522)  oxyCODONE -acetaminophen  (PERCOCET/ROXICET) 5-325 MG per tablet 2 tablet (2 tablets Oral Given 08/12/24 1522)     IMPRESSION / MDM / ASSESSMENT AND PLAN / ED COURSE  I reviewed the triage vital signs and the nursing notes.  DDx: Sciatica, vertebral compression fracture, mass, lumbar herniated disc.  Doubt cauda equina, epidural abscess, epidural hematoma, osteomyelitis/discitis  Patient's presentation is most consistent with acute presentation with potential threat to life or bodily function.  Patient returns to the ED for reevaluation of low back pain, very consistent with sciatica.  Normal neurologic and vascular exam.  Vital signs are normal.  Given gabapentin and Percocet with good relief.  X-ray reassuring.  Will continue these medications, plan to stop the Flexeril .  He will follow-up with his PCP for PT referral and medication management and continue to pursue orthopedic follow-up.       FINAL CLINICAL IMPRESSION(S) / ED DIAGNOSES   Final diagnoses:  Sciatica of right side     Rx / DC Orders   ED Discharge Orders          Ordered    gabapentin (NEURONTIN) 300 MG capsule  3 times daily        08/12/24 1703    oxyCODONE -acetaminophen  (PERCOCET) 5-325 MG tablet  Every 6 hours PRN        08/12/24 1703             Note:  This document was prepared using Dragon voice recognition software and may include unintentional dictation errors.   Viviann Pastor, MD 08/12/24 [phone]

## 2024-08-12 NOTE — ED Triage Notes (Signed)
 Patient states pain to low back that radiates into right leg; has been seen twice for same but no improvement with meds.

## 2024-08-12 NOTE — Telephone Encounter (Signed)
 PT called notified he was already seen in ER and through PCP recommended him go to [facility] walkin in clinic.

## 2024-08-12 NOTE — Telephone Encounter (Signed)
 FYI Only or Action Required?: FYI only for provider: ED advised.  Patient was last seen in primary care on 08/11/2024 by Bernardo Fend, DO.  Called Nurse Triage reporting Leg Pain.  Symptoms began several days ago.  Interventions attempted: OTC medications: Tylenol .  Symptoms are: rapidly worsening.  Triage Disposition: See HCP Within 4 Hours (Or PCP Triage)  Patient/caregiver understands and will follow disposition?: Yes  **See note below**           Copied from CRM #[id number]. Topic: Clinical - Red Word Triage >> Aug 12, 2024  8:08 AM Rosaria BRAVO wrote: Red Word that prompted transfer to Nurse Triage: Constant throbbing pain in legs, has medication questions. Nothing that he is taking is working for pain. Severe pain, 8/10 killing me   Right hip/thigh >> Aug 12, 2024  8:45 AM Mia F wrote: Appt scheduled with [facility]  Reason for Disposition  [1] SEVERE pain (e.g., excruciating, unable to do any normal activities) AND [2] not improved after 2 hours of pain medicine  Answer Assessment - Initial Assessment Questions 1. ONSET: When did the pain start?      Last Friday   2. LOCATION: Where is the pain located?      Right leg, hip   3. PAIN: How bad is the pain?    (Scale 1-10; or mild, moderate, severe)     Severe  4. WORK OR EXERCISE: Has there been any recent work or exercise that involved this part of the body?      No   5. CAUSE: What do you think is causing the leg pain?     Sciatic possibly, however he stated he was unsure   6. OTHER SYMPTOMS: Do you have any other symptoms? (e.g., chest pain, back pain, breathing difficulty, swelling, rash, fever, numbness, weakness)  No    Patient spoke with triage earlier today. Patient has bill at [facility] walkin in clinic in which he cannot be seen. During the call the patient was in severe pain, and was crying. He was referred to ED for immediate pain relief, he agrees with plan of care and will go  now; unless the office can schedule him to be seen now.  Protocols used: Leg Pain-A-AH

## 2024-08-12 NOTE — ED Notes (Signed)
 See triage note Presents with lower back pain  States pain is moving into right leg   Hx of same   Denies any new or recent injury

## 2024-08-12 NOTE — Telephone Encounter (Addendum)
     FYI Only or Action Required?:   Patient was last seen in primary care on 08/11/2024 by Bernardo Fend, DO.  Called Nurse Triage reporting Pain.  Symptoms began Friday.  Interventions attempted: Nothing.  Symptoms are: gradually worsening.  Triage Disposition: See Physician Within 24 Hours  Patient/caregiver understands and will follow disposition?: Yes  Copied from CRM #[id number]. Topic: Clinical - Red Word Triage >> Aug 12, 2024  8:08 AM Rosaria BRAVO wrote: Red Word that prompted transfer to Nurse Triage: Constant throbbing pain in legs, has medication questions. Nothing that he is taking is working for pain. Severe pain, 8/10 killing me   Right hip/thigh Reason for Disposition  [1] Muscle aches are unexplained AND [2] occur within 1 month of a tick bite  Answer Assessment - Initial Assessment Questions 1. ONSET: When did the muscle aches or body pains start?      Friday and worsening 2. LOCATION: What part of your body is hurting? (e.g., entire body, arms, legs)      Pain starts in right hip and does into thigh and down to knee  3. SEVERITY: How bad is the pain? (Scale 1-10; or mild, moderate, severe)     10/10 4. CAUSE: What do you think is causing the pains?     unknown 5. FEVER: Do you have a fever? If Yes, ask: What is your temperature, how was it measured, and  when did it start?      no 6. OTHER SYMPTOMS: Do you have any other symptoms? (e.g., chest pain, cold or flu symptoms, rash, weakness, weight loss)     Weakness,  7. PREGNANCY: Is there any chance you are pregnant? When was your last menstrual period?     no 8. TRAVEL: Have you traveled out of the country in the last month? (e.g., exposures, travel history)     no Hurts worst when moving leg - throbbing sensation. Unable to bend, rollover without severe pain.  When moving toes pain sensations goes into toes.  Pt was seen in office Monday and sent to ER where he received tx: pt  stated tx was temporary and now pain is back to where it was at first: would like to be seen . Nurse offered appt: pt stated he needs to call wife to find out what time she is available for today to take him to the appt: will call back to schedule in a few.  Protocols used: Muscle Aches and Body Pain-A-AH

## 2024-09-07 ENCOUNTER — Other Ambulatory Visit: Payer: Self-pay | Admitting: Nurse Practitioner

## 2024-09-07 DIAGNOSIS — J0140 Acute pansinusitis, unspecified: Secondary | ICD-10-CM

## 2024-09-10 NOTE — Telephone Encounter (Signed)
 Requested medication (s) are due for refill today: yes  Requested medication (s) are on the active medication list: yes  Last refill:  07/09/24  Future visit scheduled: no  Notes to clinic:  Unable to refill per protocol, cannot delegate.      Requested Prescriptions  Pending Prescriptions Disp Refills   ALLEGRA-D ALLERGY  & CONGESTION 60-120 MG 12 hr tablet [Pharmacy Med Name: ALLEGRA-D 12HR TABLETS 30'S (OTC)] 60 tablet 0    Sig: TAKE 1 TABLET BY MOUTH TWICE DAILY     Not Delegated - Ear, Nose, and Throat:  Combination Products with Pseudoephedrine Failed - 09/10/2024  1:38 PM      Failed - This refill cannot be delegated      Passed - Last BP in normal range    BP Readings from Last 1 Encounters:  08/12/24 107/84         Passed - Valid encounter within last 12 months    Recent Outpatient Visits           1 month ago Acute right-sided low back pain with right-sided sciatica   [facility] Bernardo Fend, DO   5 months ago Immunization due   [facility] Gareth Mliss FALCON, FNP   7 months ago Frequency of urination   [facility] Gareth Mliss FALCON, FNP       Future Appointments             In 2 weeks Gareth, Mliss FALCON, FNP [facility], [place]   In 7 months Francisca, Redell BROCKS, MD [facility] [place]

## 2024-09-13 ENCOUNTER — Other Ambulatory Visit: Payer: Self-pay | Admitting: Nurse Practitioner

## 2024-09-13 DIAGNOSIS — G4709 Other insomnia: Secondary | ICD-10-CM

## 2024-09-13 DIAGNOSIS — J0140 Acute pansinusitis, unspecified: Secondary | ICD-10-CM

## 2024-09-16 ENCOUNTER — Encounter: Payer: Self-pay | Admitting: Emergency Medicine

## 2024-09-16 ENCOUNTER — Ambulatory Visit
Admission: EM | Admit: 2024-09-16 | Discharge: 2024-09-16 | Disposition: A | Discharge status: Home / Self Care | Attending: Emergency Medicine | Admitting: Emergency Medicine

## 2024-09-16 DIAGNOSIS — F410 Panic disorder [episodic paroxysmal anxiety] without agoraphobia: Secondary | ICD-10-CM | POA: Diagnosis not present

## 2024-09-16 DIAGNOSIS — R0602 Shortness of breath: Secondary | ICD-10-CM

## 2024-09-16 LAB — POC SOFIA SARS ANTIGEN FIA: SARS Coronavirus 2 Ag: NEGATIVE

## 2024-09-16 MED ORDER — HYDROXYZINE HCL 25 MG PO TABS: 25.0000 mg | ORAL_TABLET | Freq: Four times a day (QID) | ORAL | 0 refills | Status: AC

## 2024-09-16 NOTE — Discharge Instructions (Addendum)
 As we discussed, your workup was largely unremarkable other than the fact you do have mild dehydration because you are not drinking of fluids.  I encourage you to increase your oral fluid intake to at least 64 ounces of water  daily.  You also need to try and eat, even if you do not have appetite, as your body requires protein and nourishment in order to heal.  I am going to prescribe you some hydroxyzine that you can use as needed when you feel your symptoms developing.  You may take 1/2 to 1 tablet as needed.  I would schedule an appointment with your primary care provider to discuss other medication management strategies to help you with your anxiety as well as I would encourage you to find a therapist to talk to regarding anxiety and depression.  Chronic pain can cause both anxiety and depression and medication is only a partial tool for dealing with those symptoms.

## 2024-09-16 NOTE — ED Triage Notes (Addendum)
 Sx x 3-4 days  Weak Sweating Fatigue Palpitations   Wife states that he's on meds for leg and back pain.   Robaxin  Tramadol  Gabapentin 

## 2024-09-16 NOTE — Telephone Encounter (Signed)
 Requested Prescriptions  Pending Prescriptions Disp Refills   ALLEGRA-D ALLERGY  & CONGESTION 60-120 MG 12 hr tablet [Pharmacy Med Name: ALLEGRA-D 12HR TABLETS 30'S (OTC)] 60 tablet 0    Sig: TAKE 1 TABLET BY MOUTH TWICE DAILY     Not Delegated - Ear, Nose, and Throat:  Combination Products with Pseudoephedrine Failed - 09/16/2024  2:06 PM      Failed - This refill cannot be delegated      Passed - Last BP in normal range    BP Readings from Last 1 Encounters:  08/12/24 107/84         Passed - Valid encounter within last 12 months    Recent Outpatient Visits           1 month ago Acute right-sided low back pain with right-sided sciatica   [facility] Bernardo Fend, DO   5 months ago Immunization due   [facility] Gareth Mliss FALCON, FNP   8 months ago Frequency of urination   [facility] Gareth Mliss FALCON, FNP       Future Appointments             In 1 week Gareth Mliss FALCON, FNP [facility], [place]   In 6 months Francisca, Redell BROCKS, MD [facility] [place]             traZODone  (DESYREL ) 50 MG tablet [Pharmacy Med Name: TRAZODONE  50MG  TABLETS] 30 tablet 0    Sig: TAKE 1/2 TO 1 TABLET(25 TO 50 MG) BY MOUTH AT BEDTIME AS NEEDED     Psychiatry: Antidepressants - Serotonin Modulator Passed - 09/16/2024  2:06 PM      Passed - Valid encounter within last 6 months    Recent Outpatient Visits           1 month ago Acute right-sided low back pain with right-sided sciatica   [facility] Bernardo Fend, DO   5 months ago Immunization due   [facility] Gareth Mliss FALCON, FNP   8 months ago Frequency of urination   [facility] Gareth Mliss FALCON, FNP       Future Appointments             In 1 week Gareth, Mliss FALCON, FNP [facility] [place]   In 6  months Francisca, Redell BROCKS, MD [facility] [place]

## 2024-09-16 NOTE — ED Provider Notes (Signed)
 [facility]    CSN: [id number] Arrival date & time: 09/16/24  1626      History   Chief Complaint Chief Complaint  Patient presents with   Weakness    HPI Douglas Abbott. is a 58 y.o. male.   HPI  58 year old male with past medical history significant for asthma, allergic rhinitis, decreased libido, GERD, vitamin D  deficiency, COPD, and BPH presents for evaluation of intermittent heart racing, feeling hot, shortness of breath, and weakness that has been going on for the last 4 days.  He reports that he has not had much of an appetite since Thanksgiving.  He denies any chest pain or syncope.  He has had intermittent dizziness.  No numbness or tingling in his extremities or around his mouth.  Past Medical History:  Diagnosis Date   Allergic rhinitis    Allergy     Asthma    BPH with urinary obstruction    BRBPR (bright red blood per rectum) 03/27/2018   COPD (chronic obstructive pulmonary disease) (HCC)    Decreased libido    Dysrhythmia    GERD (gastroesophageal reflux disease)    Hemorrhoids    History of colon polyps    History of methicillin resistant staphylococcus aureus (MRSA) 2015   History of pleurisy    Lower urinary tract symptoms (LUTS)    Microscopic hematuria    Nasal polyps    Sinus bradycardia    a. 03/2017 Echo: Ef 50-55%.   Vitamin D  deficiency     Patient Active Problem List   Diagnosis Date Noted   Allergic rhinitis due to pollen 07/09/2023   Hypertrophy of inferior nasal turbinate 07/09/2023   Nasal congestion 07/09/2023   Nasal septal deviation 07/09/2023   History of colonic polyps 05/21/2023   Lower urinary tract symptoms (LUTS) 03/26/2023   Food intolerance in adult    Urinary straining 07/23/2021   Hoarse voice quality 07/23/2021   Tinea pedis of left foot 03/09/2020   Synovitis and tenosynovitis 05/11/2019   Hemorrhoids 12/30/2018   Bradycardia 09/29/2018   Borderline high cholesterol 06/29/2018   Vitamin D   deficiency 06/29/2018   Right shoulder pain 09/16/2017   GERD (gastroesophageal reflux disease) 01/02/2017   Hematuria 09/11/2015   Asthma, chronic 03/09/2015   Allergic rhinitis 03/09/2015    Past Surgical History:  Procedure Laterality Date   COLONOSCOPY WITH PROPOFOL  N/A 05/04/2018   Procedure: COLONOSCOPY WITH PROPOFOL ;  Surgeon: Therisa Bi, MD;  Location: [facility] ENDOSCOPY;  Service: Gastroenterology;  Laterality: N/A;   COLONOSCOPY WITH PROPOFOL  N/A 05/21/2023   Procedure: COLONOSCOPY WITH PROPOFOL ;  Surgeon: Unk Corinn Skiff, MD;  Location: [facility] ENDOSCOPY;  Service: Gastroenterology;  Laterality: N/A;   CYSTOSCOPY WITH INSERTION OF UROLIFT N/A 03/12/2024   Procedure: CYSTOSCOPY WITH INSERTION OF UROLIFT;  Surgeon: Francisca Redell BROCKS, MD;  Location: [facility] ORS;  Service: Urology;  Laterality: N/A;   ESOPHAGOGASTRODUODENOSCOPY (EGD) WITH PROPOFOL  N/A 04/10/2022   Procedure: ESOPHAGOGASTRODUODENOSCOPY (EGD) WITH PROPOFOL ;  Surgeon: Unk Corinn Skiff, MD;  Location: [facility] ENDOSCOPY;  Service: Gastroenterology;  Laterality: N/A;       Home Medications    Prior to Admission medications   Medication Sig Start Date End Date Taking? Authorizing Provider  celecoxib (CELEBREX) 200 MG capsule TAKE 1 CAPSULE BY MOUTH EVERY DAY AS NEEDED 08/27/24  Yes [provider]  gabapentin  (NEURONTIN ) 300 MG capsule Take 1 capsule (300 mg total) by mouth 3 (three) times daily. 08/12/24 09/16/24 Yes Viviann Pastor, MD  hydrOXYzine (ATARAX) 25 MG  tablet Take 1 tablet (25 mg total) by mouth every 6 (six) hours. 09/16/24  Yes Bernardino Ditch, NP  methocarbamol  (ROBAXIN ) 500 MG tablet Take 1 tablet (500 mg total) by mouth every 8 (eight) hours as needed for muscle spasms. 08/09/24  Yes Ray, Nilsa, MD  traMADol (ULTRAM) 50 MG tablet Take 50 mg by mouth every 4 (four) hours as needed. for pain 09/08/24  Yes [provider]  albuterol  (VENTOLIN  HFA) 108 (90 Base) MCG/ACT inhaler Inhale 2 puffs into  the lungs every 6 (six) hours as needed for wheezing or shortness of breath. 10/01/22   Kennyth Domino, FNP  ALLEGRA-D ALLERGY  & CONGESTION 60-120 MG 12 hr tablet TAKE 1 TABLET BY MOUTH TWICE DAILY 09/16/24   Pender, Julie F, FNP  budesonide -formoterol  (SYMBICORT ) 160-4.5 MCG/ACT inhaler Inhale 2 puffs into the lungs 2 (two) times daily. 01/21/22   Gareth Mliss FALCON, FNP  naproxen  (NAPROSYN ) 500 MG tablet TAKE 1 TABLET(500 MG) BY MOUTH TWICE DAILY WITH A MEAL 04/12/24   Gareth Mliss FALCON, FNP  omeprazole  (PRILOSEC) 20 MG capsule TAKE ONE CAPSULE BY MOUTH DAILY 04/13/24   Pender, Julie F, FNP  sildenafil  (VIAGRA ) 100 MG tablet TAKE 1 TABLET(100 MG) BY MOUTH DAILY AS NEEDED FOR ERECTILE DYSFUNCTION 04/22/24   Gareth Mliss FALCON, FNP  tamsulosin  (FLOMAX ) 0.4 MG CAPS capsule TAKE 2 CAPSULES(0.8 MG) BY MOUTH DAILY 04/26/24   Pender, Julie F, FNP  traZODone  (DESYREL ) 50 MG tablet TAKE 1/2 TO 1 TABLET(25 TO 50 MG) BY MOUTH AT BEDTIME AS NEEDED 09/16/24   Gareth Mliss FALCON, FNP    Family History Family History  Problem Relation Age of Onset   Asthma Mother    Lung cancer Mother    Kidney disease Mother    Heart disease Neg Hx     Social History Social History   Tobacco Use   Smoking status: Former    Current packs/day: 0.00    Average packs/day: 1 pack/day for 8.0 years (8.0 ttl pk-yrs)    Types: Cigarettes    Start date: 36    Quit date: 2000    Years since quitting: 25.9   Smokeless tobacco: Never  Vaping Use   Vaping status: Never Used  Substance Use Topics   Alcohol use: Yes    Comment: rare   Drug use: Not Currently    Frequency: 1.0 times per week    Types: Marijuana     Allergies   Shellfish allergy  and Peanut-containing drug products   Review of Systems Review of Systems  Constitutional:  Positive for appetite change. Negative for fever.  Respiratory:  Positive for shortness of breath.   Cardiovascular:  Positive for palpitations. Negative for chest pain.  Neurological:  Positive  for dizziness and weakness. Negative for syncope and numbness.     Physical Exam Triage Vital Signs ED Triage Vitals  Encounter Vitals Group     BP      Girls Systolic BP Percentile      Girls Diastolic BP Percentile      Boys Systolic BP Percentile      Boys Diastolic BP Percentile      Pulse      Resp      Temp      Temp src      SpO2      Weight      Height      Head Circumference      Peak Flow      Pain Score  Pain Loc      Pain Education      Exclude from Growth Chart    Orthostatic VS for the past 24 hrs:  BP- Lying Pulse- Lying BP- Sitting Pulse- Sitting BP- Standing at 0 minutes Pulse- Standing at 0 minutes  09/16/24 1640 (!) 128/97 94 (!) 135/98 99 (!) 107/92 108    Updated Vital Signs BP (!) 128/97 (BP Location: Right Arm)   Pulse 97   Temp 99 F (37.2 C) (Oral)   Resp 19   Wt 175 lb (79.4 kg)   SpO2 100%   BMI 22.47 kg/m   Visual Acuity Right Eye Distance:   Left Eye Distance:   Bilateral Distance:    Right Eye Near:   Left Eye Near:    Bilateral Near:     Physical Exam Vitals and nursing note reviewed.  Constitutional:      Appearance: Normal appearance. He is not ill-appearing.  HENT:     Head: Normocephalic and atraumatic.     Mouth/Throat:     Mouth: Mucous membranes are dry.     Pharynx: Oropharynx is clear. No oropharyngeal exudate or posterior oropharyngeal erythema.     Comments: Oral mucous membranes are sticky. Cardiovascular:     Rate and Rhythm: Normal rate and regular rhythm.     Pulses: Normal pulses.     Heart sounds: Normal heart sounds. No murmur heard.    No friction rub. No gallop.  Pulmonary:     Effort: Pulmonary effort is normal.     Breath sounds: Normal breath sounds. No wheezing, rhonchi or rales.  Skin:    General: Skin is warm and dry.     Capillary Refill: Capillary refill takes less than 2 seconds.     Findings: No rash.  Neurological:     General: No focal deficit present.     Mental Status: He  is alert and oriented to person, place, and time.      UC Treatments / Results  Labs (all labs ordered are listed, but only abnormal results are displayed) Labs Reviewed  POC SOFIA SARS ANTIGEN FIA - Normal    EKG Number sinus rhythm with a ventricular rate of 75 bpm PR interval 140 ms QRS duration 80 ms QT/QTc 362/404 ms No ST or T wave abnormalities noted.  Radiology No results found.  Procedures Procedures (including critical care time)  Medications Ordered in UC Medications - No data to display  Initial Impression / Assessment and Plan / UC Course  I have reviewed the triage vital signs and the nursing notes.  Pertinent labs & imaging results that were available during my care of the patient were reviewed by me and considered in my medical decision making (see chart for details).   Patient is a nontoxic-appearing 58 year old male presenting for evaluation of intermittent heart racing that began 4 days ago.  He is reporting that he has not had a significant appetite since Thanksgiving and he has not been eating much food and drinking very little water  in the last 7 days.  He reports that he will have these intermittent episodes where he will feel hot, his heart will race, he will feel dizzy and short of breath.  These will last for period of time and then resolve.  They started happening more frequently last night and it caused him to be scared and he reports that he is still scared.  He is not having any chest pain and has not had any syncope.  Physical exam reveals a mildly elevated temp of 99 but the remainder of his vital signs are within normal limits.  He does have sticky oral mucous membranes and slightly decreased skin turgor but no tenting.  Cardiopulmonary exam is benign.  Differential diagnosis mild dehydration versus anxiety.  I will obtain an EKG to evaluate for any cardiac cause of the patient's symptoms and have staff perform orthostatic vital signs to determine his  hydration status.  Additionally, because of the elevated temp, I will order a COVID test as well.  EKG shows normal sinus rhythm without any ST or T wave abnormalities.  No change when compared to EKG from 08/26/2018.  Orthostatic vital signs show a 21 point drop in systolic blood pressure and a 14 point rise in pulse rate between lying and standing.  Mild orthostasis present.  COVID antigen test is negative.  I talked to the patient about his workup here and that it is largely unremarkable other than the fact that he is mildly dehydrated as evidenced by the mild orthostasis.  His symptoms most closely resemble anxiety attack.  He has no document history of anxiety but he reports that he has been dealing with a lot of chronic pain lately which is causing a lot of stress.  He is waiting to start injections to help him deal with his pain.  He is typically an active individual and has had to curtail his physical activity due to the pain that he is experiencing.  I will discharge patient on the diagnosis of anxiety and I will prescribe him some hydroxyzine that he can use as an abortive.  I have advised him that he should talk to his primary care doctor about maybe starting something to help with his anxiety in addition to finding a therapist to help manage anxiety and possibly some depression as a result of his chronic pain.   Final Clinical Impressions(s) / UC Diagnoses   Final diagnoses:  Shortness of breath  Anxiety attack     Discharge Instructions      As we discussed, your workup was largely unremarkable other than the fact you do have mild dehydration because you are not drinking of fluids.  I encourage you to increase your oral fluid intake to at least 64 ounces of water  daily.  You also need to try and eat, even if you do not have appetite, as your body requires protein and nourishment in order to heal.  I am going to prescribe you some hydroxyzine that you can use as needed when  you feel your symptoms developing.  You may take 1/2 to 1 tablet as needed.  I would schedule an appointment with your primary care provider to discuss other medication management strategies to help you with your anxiety as well as I would encourage you to find a therapist to talk to regarding anxiety and depression.  Chronic pain can cause both anxiety and depression and medication is only a partial tool for dealing with those symptoms.     ED Prescriptions     Medication Sig Dispense Auth. Provider   hydrOXYzine (ATARAX) 25 MG tablet Take 1 tablet (25 mg total) by mouth every 6 (six) hours. 12 tablet Bernardino Ditch, NP      PDMP not reviewed this encounter.   Bernardino Ditch, NP 09/16/24 1724

## 2024-09-16 NOTE — Telephone Encounter (Signed)
 Requested medications are due for refill today.  yes  Requested medications are on the active medications list.  yes  Last refill. 07/09/2024 #60 0 rf  Future visit scheduled.   yes  Notes to clinic.  Refill not delegated.    Requested Prescriptions  Pending Prescriptions Disp Refills   ALLEGRA-D ALLERGY  & CONGESTION 60-120 MG 12 hr tablet [Pharmacy Med Name: ALLEGRA-D 12HR TABLETS 30'S (OTC)] 60 tablet 0    Sig: TAKE 1 TABLET BY MOUTH TWICE DAILY     Not Delegated - Ear, Nose, and Throat:  Combination Products with Pseudoephedrine Failed - 09/16/2024  2:07 PM      Failed - This refill cannot be delegated      Passed - Last BP in normal range    BP Readings from Last 1 Encounters:  08/12/24 107/84         Passed - Valid encounter within last 12 months    Recent Outpatient Visits           1 month ago Acute right-sided low back pain with right-sided sciatica   [facility] Bernardo Fend, DO   5 months ago Immunization due   [facility] Gareth Mliss FALCON, FNP   8 months ago Frequency of urination   [facility] Gareth Mliss FALCON, FNP       Future Appointments             In 1 week Gareth Mliss FALCON, FNP [facility] [place]   In 6 months Francisca, Redell BROCKS, MD [facility] [place]            Signed Prescriptions Disp Refills   traZODone  (DESYREL ) 50 MG tablet 30 tablet 2    Sig: TAKE 1/2 TO 1 TABLET(25 TO 50 MG) BY MOUTH AT BEDTIME AS NEEDED     Psychiatry: Antidepressants - Serotonin Modulator Passed - 09/16/2024  2:07 PM      Passed - Valid encounter within last 6 months    Recent Outpatient Visits           1 month ago Acute right-sided low back pain with right-sided sciatica   [facility] Bernardo Fend, DO   5 months ago Immunization due   [facility] Gareth Mliss FALCON, FNP   8 months  ago Frequency of urination   [facility] Gareth Mliss FALCON, FNP       Future Appointments             In 1 week Gareth, Mliss FALCON, FNP [facility] [place]   In 6 months Francisca, Redell BROCKS, MD [facility] [place]

## 2024-09-24 ENCOUNTER — Encounter: Payer: Self-pay | Admitting: Nurse Practitioner

## 2024-09-24 ENCOUNTER — Other Ambulatory Visit: Payer: Self-pay

## 2024-09-24 ENCOUNTER — Ambulatory Visit: Admitting: Nurse Practitioner

## 2024-09-24 ENCOUNTER — Other Ambulatory Visit: Payer: Self-pay | Admitting: Nurse Practitioner

## 2024-09-24 VITALS — BP 120/78 | HR 81 | Temp 98.2°F | Resp 16 | Ht 74.0 in | Wt 180.8 lb

## 2024-09-24 DIAGNOSIS — N529 Male erectile dysfunction, unspecified: Secondary | ICD-10-CM | POA: Insufficient documentation

## 2024-09-24 DIAGNOSIS — Z130 Encounter for screening for diseases of the blood and blood-forming organs and certain disorders involving the immune mechanism: Secondary | ICD-10-CM | POA: Diagnosis not present

## 2024-09-24 DIAGNOSIS — J4540 Moderate persistent asthma, uncomplicated: Secondary | ICD-10-CM | POA: Diagnosis not present

## 2024-09-24 DIAGNOSIS — F419 Anxiety disorder, unspecified: Secondary | ICD-10-CM | POA: Diagnosis not present

## 2024-09-24 DIAGNOSIS — R7303 Prediabetes: Secondary | ICD-10-CM | POA: Insufficient documentation

## 2024-09-24 DIAGNOSIS — K219 Gastro-esophageal reflux disease without esophagitis: Secondary | ICD-10-CM

## 2024-09-24 DIAGNOSIS — J301 Allergic rhinitis due to pollen: Secondary | ICD-10-CM | POA: Diagnosis not present

## 2024-09-24 DIAGNOSIS — E785 Hyperlipidemia, unspecified: Secondary | ICD-10-CM | POA: Diagnosis not present

## 2024-09-24 DIAGNOSIS — M5416 Radiculopathy, lumbar region: Secondary | ICD-10-CM | POA: Insufficient documentation

## 2024-09-24 DIAGNOSIS — R399 Unspecified symptoms and signs involving the genitourinary system: Secondary | ICD-10-CM | POA: Diagnosis not present

## 2024-09-24 DIAGNOSIS — G4709 Other insomnia: Secondary | ICD-10-CM | POA: Insufficient documentation

## 2024-09-24 MED ORDER — FEXOFENADINE-PSEUDOEPHED ER 60-120 MG PO TB12: 1.0000 | ORAL_TABLET | Freq: Two times a day (BID) | ORAL | 3 refills | Status: AC

## 2024-09-24 MED ORDER — TRAZODONE HCL 50 MG PO TABS: 25.0000 mg | ORAL_TABLET | Freq: Every evening | ORAL | 2 refills | Status: AC | PRN

## 2024-09-24 MED ORDER — BUDESONIDE-FORMOTEROL FUMARATE 160-4.5 MCG/ACT IN AERO: 2.0000 | INHALATION_SPRAY | Freq: Two times a day (BID) | RESPIRATORY_TRACT | 3 refills | Status: AC

## 2024-09-24 MED ORDER — ALBUTEROL SULFATE HFA 108 (90 BASE) MCG/ACT IN AERS: 2.0000 | INHALATION_SPRAY | Freq: Four times a day (QID) | RESPIRATORY_TRACT | 5 refills | Status: AC | PRN

## 2024-09-24 NOTE — Progress Notes (Signed)
 BP 120/78 (Cuff Size: Large)   Pulse 81   Temp 98.2 F (36.8 C) (Oral)   Resp 16   Ht 6' 2 (1.88 m)   Wt 180 lb 12.8 oz (82 kg)   SpO2 99%   BMI 23.21 kg/m    Subjective:    Patient ID: Douglas LITTIE Leonce Mickey., male    DOB: [birth year], 58 y.o.   MRN: [id number]  HPI: Douglas Landers. is a 59 y.o. male  Chief Complaint  Patient presents with   Medical Management of Chronic Issues    6 month recheck   Discussed the use of AI scribe software for clinical note transcription with the patient, who gave verbal consent to proceed.  History of Present Illness Douglas L Jammal Sarr. is a 58 year old male with asthma, GERD, and lumbar radiculopathy who presents for a six month follow-up. He is accompanied by his daughter.  Asthma and allergic rhinitis - Ongoing asthma symptoms with current lack of Symbicort  and only one dose of Proventil  remaining - Increased coughing and phlegm production since running out of Allegra, which previously controlled allergic rhinitis symptoms - Finds Allegra effective for allergic rhinitis  Gastroesophageal reflux disease (gerd) - GERD managed with omeprazole  20 mg daily  Hyperlipidemia and glycemic control - Last lipid panel in June showed LDL 106, improved from 121 - A1c was 5.9  Insomnia - Uses trazodone  25 to 50 mg at bedtime as needed, which improves sleep - Experiences restless nights without trazodone   Lumbar radiculopathy and neuropathic symptoms - Tingling and numbness, particularly around the knee - Uses tramadol for pain but limits use due to concerns about medication dependency  Anxiety - Experiences anxiety attributed to being out of work and taking multiple medications - Hydroxyzine  has helped alleviate anxiety symptoms  Benign prostatic hyperplasia and erectile dysfunction - Takes Flomax  0.8 mg daily for BPH - Uses Viagra  100 mg as needed for erectile dysfunction         09/24/2024    7:50 AM 03/25/2024    7:41 AM  09/25/2023    7:44 AM  Depression screen PHQ 2/9  Decreased Interest 0 0 0  Down, Depressed, Hopeless 0 0 0  PHQ - 2 Score 0 0 0    Relevant past medical, surgical, family and social history reviewed and updated as indicated. Interim medical history since our last visit reviewed. Allergies and medications reviewed and updated.  Review of Systems  Constitutional: Negative for fever or weight change.  Respiratory: Negative for cough and shortness of breath.   Cardiovascular: Negative for chest pain or palpitations.  Gastrointestinal: Negative for abdominal pain, no bowel changes.  Musculoskeletal: positive  for gait problem or joint swelling. Positive for low back pain Skin: Negative for rash.  Neurological: Negative for dizziness or headache.  No other specific complaints in a complete review of systems (except as listed in HPI above).      Objective:      BP 120/78 (Cuff Size: Large)   Pulse 81   Temp 98.2 F (36.8 C) (Oral)   Resp 16   Ht 6' 2 (1.88 m)   Wt 180 lb 12.8 oz (82 kg)   SpO2 99%   BMI 23.21 kg/m    Wt Readings from Last 3 Encounters:  09/24/24 180 lb 12.8 oz (82 kg)  09/16/24 175 lb (79.4 kg)  08/12/24 175 lb (79.4 kg)    Physical Exam VITALS: BP- 120/78 MEASUREMENTS: Weight- 180. GENERAL: Alert, cooperative,  well developed, no acute distress. HEENT: Normocephalic, normal oropharynx, moist mucous membranes. CHEST: Clear to auscultation bilaterally, no wheezes, rhonchi, or crackles. CARDIOVASCULAR: Normal heart rate and rhythm, S1 and S2 normal without murmurs. ABDOMEN: Soft, non-tender, non-distended, without organomegaly, normal bowel sounds. EXTREMITIES: No cyanosis or edema. NEUROLOGICAL: Cranial nerves grossly intact, moves all extremities without gross motor or sensory deficit.  Results for orders placed or performed during the hospital encounter of 09/16/24  POC SARS Coronavirus Ag   Collection Time: 09/16/24  5:14 PM  Result Value Ref Range    SARS Coronavirus 2 Ag Negative Negative          Assessment & Plan:   Problem List Items Addressed This Visit       Respiratory   Asthma, chronic - Primary (Chronic)   Relevant Medications   albuterol  (VENTOLIN  HFA) 108 (90 Base) MCG/ACT inhaler   budesonide -formoterol  (SYMBICORT ) 160-4.5 MCG/ACT inhaler   Allergic rhinitis (Chronic)   Relevant Medications   fexofenadine -pseudoephedrine (ALLEGRA-D ALLERGY  & CONGESTION) 60-120 MG 12 hr tablet     Digestive   GERD (gastroesophageal reflux disease) (Chronic)     Nervous and Auditory   Lumbar radiculopathy   Relevant Medications   traZODone  (DESYREL ) 50 MG tablet     Other   Lower urinary tract symptoms (LUTS)   Erectile dysfunction   Other insomnia   Relevant Medications   traZODone  (DESYREL ) 50 MG tablet   Hyperlipidemia   Relevant Orders   Lipid panel   Prediabetes   Relevant Orders   Comprehensive metabolic panel with GFR   Hemoglobin A1c   Anxiety   Relevant Medications   traZODone  (DESYREL ) 50 MG tablet   Other Visit Diagnoses       Screening for deficiency anemia       Relevant Orders   CBC with Differential/Platelet        Assessment and Plan Assessment & Plan Lumbar radiculopathy Chronic lumbar radiculopathy with tingling and numbness radiating from the back down the leg. Pain has subsided except for around the knee. Recent exacerbation of symptoms with increased leg pain and tingling. Concerns about medication side effects and potential dependency. - Use tramadol as needed for pain management. -being seen at [facility], is scheduled for steroid injection 12/26  Moderate persistent asthma Asthma managed with albuterol  and Symbicort . Recent exacerbation due to running out of Allegra, leading to increased coughing and wheezing. Allegra has been effective in managing symptoms. - Sent refill for Allegra to pharmacy. - Continue albuterol  and Symbicort  as prescribed.  Seasonal allergic  rhinitis Managed with Allegra. Symptoms worsened after running out of medication. - Sent refill for Allegra to pharmacy.  Gastroesophageal reflux disease GERD managed with omeprazole . - Continue omeprazole  20 mg daily.  Hyperlipidemia LDL improved from 121 to 106 as of last lipid panel in June. - Continue current management and monitor lipid levels.  Prediabetes A1c in the prediabetic range at 5.9.  Other insomnia Insomnia managed with trazodone , which is effective in aiding sleep. Concerns about medication side effects and potential dependency. - Continue trazodone  25-50 mg at bedtime as needed.  Erectile dysfunction Managed with Viagra  as needed. - Continue Viagra  100 mg as needed.  General health maintenance Up to date on colorectal and prostate cancer screenings. PSA levels are normal. - Continue routine health maintenance and screenings.        Follow up plan: Return in about 6 months (around 03/25/2025) for follow up.

## 2024-09-25 ENCOUNTER — Other Ambulatory Visit: Payer: Self-pay | Admitting: Nurse Practitioner

## 2024-09-25 DIAGNOSIS — J069 Acute upper respiratory infection, unspecified: Secondary | ICD-10-CM

## 2024-09-25 LAB — CBC WITH DIFFERENTIAL/PLATELET
Absolute Lymphocytes: 1424 {cells}/uL (ref 850–3900)
Absolute Monocytes: 442 {cells}/uL (ref 200–950)
Basophils Absolute: 28 {cells}/uL (ref 0–200)
Basophils Relative: 0.6 %
Eosinophils Absolute: 141 {cells}/uL (ref 15–500)
Eosinophils Relative: 3 %
HCT: 38.6 % — ABNORMAL LOW (ref 39.4–51.1)
Hemoglobin: 12.8 g/dL — ABNORMAL LOW (ref 13.2–17.1)
MCH: 29.4 pg (ref 27.0–33.0)
MCHC: 33.2 g/dL (ref 31.6–35.4)
MCV: 88.7 fL (ref 81.4–101.7)
MPV: 10 fL (ref 7.5–12.5)
Monocytes Relative: 9.4 %
Neutro Abs: 2665 {cells}/uL (ref 1500–7800)
Neutrophils Relative %: 56.7 %
Platelets: 334 Thousand/uL (ref 140–400)
RBC: 4.35 Million/uL (ref 4.20–5.80)
RDW: 14.5 % (ref 11.0–15.0)
Total Lymphocyte: 30.3 %
WBC: 4.7 Thousand/uL (ref 3.8–10.8)

## 2024-09-25 LAB — COMPREHENSIVE METABOLIC PANEL WITH GFR
AG Ratio: 2 (calc) (ref 1.0–2.5)
ALT: 14 U/L (ref 9–46)
AST: 13 U/L (ref 10–35)
Albumin: 4 g/dL (ref 3.6–5.1)
Alkaline phosphatase (APISO): 122 U/L (ref 35–144)
BUN: 14 mg/dL (ref 7–25)
CO2: 27 mmol/L (ref 20–32)
Calcium: 9.1 mg/dL (ref 8.6–10.3)
Chloride: 105 mmol/L (ref 98–110)
Creat: 1.04 mg/dL (ref 0.70–1.30)
Globulin: 2 g/dL (ref 1.9–3.7)
Glucose, Bld: 94 mg/dL (ref 65–99)
Potassium: 3.9 mmol/L (ref 3.5–5.3)
Sodium: 139 mmol/L (ref 135–146)
Total Bilirubin: 0.4 mg/dL (ref 0.2–1.2)
Total Protein: 6 g/dL — ABNORMAL LOW (ref 6.1–8.1)
eGFR: 84 mL/min/1.73m2 (ref >=60)

## 2024-09-25 LAB — LIPID PANEL
Cholesterol: 171 mg/dL (ref <200)
HDL: 50 mg/dL (ref >=40)
LDL Cholesterol (Calc): 104 mg/dL — ABNORMAL HIGH
Non-HDL Cholesterol (Calc): 121 mg/dL (ref <130)
Total CHOL/HDL Ratio: 3.4 (calc) (ref <5.0)
Triglycerides: 81 mg/dL (ref <150)

## 2024-09-25 LAB — HEMOGLOBIN A1C
Hgb A1c MFr Bld: 5.8 % — ABNORMAL HIGH (ref <5.7)
Mean Plasma Glucose: 120 mg/dL
eAG (mmol/L): 6.6 mmol/L

## 2024-09-27 ENCOUNTER — Ambulatory Visit: Payer: Self-pay | Admitting: Nurse Practitioner

## 2024-09-27 NOTE — Telephone Encounter (Signed)
 Too soon for refill.  Requested Prescriptions  Pending Prescriptions Disp Refills   albuterol  (VENTOLIN  HFA) 108 (90 Base) MCG/ACT inhaler [Pharmacy Med Name: ALBUTEROL  HFA INH (200 PUFFS) 6.7GM] 6.7 g     Sig: INHALE 1 TO 2 PUFFS INTO THE LUNGS EVERY 6 HOURS AS NEEDED FOR WHEEZING OR SHORTNESS OF BREATH     Pulmonology:  Beta Agonists 2 Passed - 09/27/2024  4:20 PM      Passed - Last BP in normal range    BP Readings from Last 1 Encounters:  09/24/24 120/78         Passed - Last Heart Rate in normal range    Pulse Readings from Last 1 Encounters:  09/24/24 81         Passed - Valid encounter within last 12 months    Recent Outpatient Visits           3 days ago Moderate persistent chronic asthma without complication   [facility] Gareth Clarity F, FNP   1 month ago Acute right-sided low back pain with right-sided sciatica   [facility] Bernardo Fend, DO   6 months ago Immunization due   [facility] Gareth Clarity FALCON, FNP   8 months ago Frequency of urination   [facility] Gareth Clarity FALCON, FNP       Future Appointments             In 6 months Francisca, Redell BROCKS, MD [facility] [place]             SYMBICORT  160-4.5 MCG/ACT inhaler [Pharmacy Med Name: SYMBICORT  160/4. (120 ORAL INH)] 30.6 g 3    Sig: INHALE 2 PUFFS INTO THE LUNGS TWICE DAILY     Pulmonology:  Combination Products Passed - 09/27/2024  4:20 PM      Passed - Valid encounter within last 12 months    Recent Outpatient Visits           3 days ago Moderate persistent chronic asthma without complication   [facility] Gareth Clarity F, FNP   1 month ago Acute right-sided low back pain with right-sided sciatica   [facility] Bernardo Fend, DO   6 months ago Immunization due   [facility] Gareth Clarity FALCON,  FNP   8 months ago Frequency of urination   [facility] Gareth Clarity FALCON, FNP       Future Appointments             In 6 months Francisca, Redell BROCKS, MD [facility] [place]

## 2024-09-28 NOTE — Telephone Encounter (Signed)
 No longer on current medication list Requested Prescriptions  Pending Prescriptions Disp Refills   fluticasone  (FLONASE ) 50 MCG/ACT nasal spray [Pharmacy Med Name: FLUTICASONE  NASAL SP (120) RX] 16 g 6    Sig: SHAKE LIQUID AND USE 2 SPRAYS IN EACH NOSTRIL DAILY     Ear, Nose, and Throat: Nasal Preparations - Corticosteroids Passed - 09/28/2024 12:36 PM      Passed - Valid encounter within last 12 months    Recent Outpatient Visits           4 days ago Moderate persistent chronic asthma without complication   [facility] Gareth Clarity F, FNP   1 month ago Acute right-sided low back pain with right-sided sciatica   [facility] Bernardo Fend, DO   6 months ago Immunization due   [facility] Gareth Clarity FALCON, FNP   8 months ago Frequency of urination   [facility] Gareth Clarity FALCON, FNP       Future Appointments             In 6 months Francisca, Redell BROCKS, MD [facility] [place]

## 2024-11-04 ENCOUNTER — Ambulatory Visit: Payer: Self-pay

## 2024-11-04 NOTE — Telephone Encounter (Signed)
 FYI Only or Action Required?: Action required by provider: clinical question for provider and update on patient condition.  Patient was last seen in primary care on 09/24/2024 by Gareth Mliss FALCON, FNP.  Called Nurse Triage reporting Advice Only.   Triage Disposition: Information or Advice Only Call  Patient/caregiver understands and will follow disposition?: Yes     Copied from CRM [id number]. Topic: Clinical - Medical Advice >> Nov 04, 2024  4:18 PM Victoria B wrote: Reason for CRM: patient has nut allergy  and want to know an he eat sunflower seeds   Reason for Disposition  [1] Follow-up call to recent contact AND [2] information only call, no triage required  Answer Assessment - Initial Assessment Questions 1. REASON FOR CALL: What is the main reason for your call? or How can I best help you?     Returned call to pt and spoke to pt's wife, Bascom. Discussed sunflower seeds are a different family than tree nuts and generally safe for consumption. Discussed usually people with nut allergies are safe to eat sunflower, chia seeds, pumpkin seeds, etc. But discussed importance of always starting new foods slowly and if ordering in a restaurant to be sure to make safe aware of nut allergy  to avoid cross contamination. She did confirm pt has an allergist. Reassured her I would get info to PCP and if pt has any further questions, to f/u with clinic. She voiced appreciation.  Protocols used: Information Only Call - No Triage-A-AH

## 2024-11-05 NOTE — Telephone Encounter (Signed)
 Pt.notified

## 2025-03-25 ENCOUNTER — Ambulatory Visit: Admitting: Nurse Practitioner

## 2025-04-12 ENCOUNTER — Ambulatory Visit: Admitting: Urology
